# Patient Record
Sex: Male | Born: 1945
Health system: Southern US, Community
[De-identification: ages and names within clinical notes are randomized; demographics above are authoritative.]

## PROBLEM LIST (undated history)

## (undated) DIAGNOSIS — B1920 Unspecified viral hepatitis C without hepatic coma: Secondary | ICD-10-CM

## (undated) DIAGNOSIS — E785 Hyperlipidemia, unspecified: Secondary | ICD-10-CM

## (undated) DIAGNOSIS — I10 Essential (primary) hypertension: Secondary | ICD-10-CM

## (undated) HISTORY — PX: TRIGGER FINGER RELEASE: SHX641

## (undated) HISTORY — DX: Unspecified viral hepatitis C without hepatic coma: B19.20

## (undated) HISTORY — DX: Essential (primary) hypertension: I10

## (undated) HISTORY — PX: CARPAL TUNNEL RELEASE: SHX101

## (undated) HISTORY — DX: Hyperlipidemia, unspecified: E78.5

## (undated) HISTORY — PX: HEMORRHOID SURGERY: SHX153

---

## 2001-09-13 DIAGNOSIS — B1920 Unspecified viral hepatitis C without hepatic coma: Secondary | ICD-10-CM

## 2001-09-13 HISTORY — DX: Unspecified viral hepatitis C without hepatic coma: B19.20

## 2002-08-19 ENCOUNTER — Emergency Department (HOSPITAL_COMMUNITY): Admission: EM | Admit: 2002-08-19 | Discharge: 2002-08-19 | Payer: Self-pay | Admitting: Emergency Medicine

## 2002-08-19 ENCOUNTER — Encounter: Payer: Self-pay | Admitting: *Deleted

## 2003-10-28 ENCOUNTER — Ambulatory Visit (HOSPITAL_COMMUNITY): Admission: RE | Admit: 2003-10-28 | Discharge: 2003-10-28 | Payer: Self-pay

## 2003-10-28 ENCOUNTER — Encounter (INDEPENDENT_AMBULATORY_CARE_PROVIDER_SITE_OTHER): Payer: Self-pay | Admitting: Specialist

## 2006-06-17 ENCOUNTER — Emergency Department (HOSPITAL_COMMUNITY): Admission: EM | Admit: 2006-06-17 | Discharge: 2006-06-17 | Payer: Self-pay | Admitting: Emergency Medicine

## 2006-07-26 ENCOUNTER — Emergency Department (HOSPITAL_COMMUNITY): Admission: EM | Admit: 2006-07-26 | Discharge: 2006-07-26 | Payer: Self-pay | Admitting: Emergency Medicine

## 2006-12-25 ENCOUNTER — Emergency Department (HOSPITAL_COMMUNITY): Admission: EM | Admit: 2006-12-25 | Discharge: 2006-12-25 | Payer: Self-pay | Admitting: Emergency Medicine

## 2009-02-19 ENCOUNTER — Emergency Department (HOSPITAL_COMMUNITY): Admission: EM | Admit: 2009-02-19 | Discharge: 2009-02-19 | Payer: Self-pay | Admitting: Emergency Medicine

## 2009-07-06 ENCOUNTER — Emergency Department (HOSPITAL_COMMUNITY): Admission: EM | Admit: 2009-07-06 | Discharge: 2009-07-06 | Payer: Self-pay | Admitting: Emergency Medicine

## 2009-07-16 ENCOUNTER — Emergency Department (HOSPITAL_COMMUNITY): Admission: EM | Admit: 2009-07-16 | Discharge: 2009-07-16 | Payer: Self-pay | Admitting: Emergency Medicine

## 2009-08-15 ENCOUNTER — Ambulatory Visit: Payer: Self-pay | Admitting: Physician Assistant

## 2009-08-15 DIAGNOSIS — R030 Elevated blood-pressure reading, without diagnosis of hypertension: Secondary | ICD-10-CM

## 2009-08-15 DIAGNOSIS — Z860100 Personal history of colon polyps, unspecified: Secondary | ICD-10-CM | POA: Insufficient documentation

## 2009-08-15 DIAGNOSIS — M6530 Trigger finger, unspecified finger: Secondary | ICD-10-CM | POA: Insufficient documentation

## 2009-08-15 DIAGNOSIS — B182 Chronic viral hepatitis C: Secondary | ICD-10-CM | POA: Insufficient documentation

## 2009-08-15 DIAGNOSIS — R82998 Other abnormal findings in urine: Secondary | ICD-10-CM | POA: Insufficient documentation

## 2009-08-15 LAB — CONVERTED CEMR LAB
Bilirubin Urine: NEGATIVE
Glucose, Urine, Semiquant: NEGATIVE
Ketones, urine, test strip: NEGATIVE
Nitrite: NEGATIVE
Protein, U semiquant: NEGATIVE
Rapid HIV Screen: NEGATIVE
Specific Gravity, Urine: 1.02
Urobilinogen, UA: 0.2
WBC Urine, dipstick: NEGATIVE
pH: 5

## 2009-08-16 ENCOUNTER — Encounter: Payer: Self-pay | Admitting: Physician Assistant

## 2009-08-18 LAB — CONVERTED CEMR LAB
Bacteria, UA: NONE SEEN
RBC / HPF: NONE SEEN (ref <3)
WBC, UA: NONE SEEN cells/hpf (ref <3)

## 2009-08-25 ENCOUNTER — Encounter: Payer: Self-pay | Admitting: Physician Assistant

## 2009-08-26 ENCOUNTER — Encounter: Payer: Self-pay | Admitting: Physician Assistant

## 2009-08-28 ENCOUNTER — Encounter (INDEPENDENT_AMBULATORY_CARE_PROVIDER_SITE_OTHER): Payer: Self-pay | Admitting: *Deleted

## 2009-09-01 ENCOUNTER — Telehealth: Payer: Self-pay | Admitting: Physician Assistant

## 2009-09-01 DIAGNOSIS — F1410 Cocaine abuse, uncomplicated: Secondary | ICD-10-CM | POA: Insufficient documentation

## 2009-09-01 DIAGNOSIS — D709 Neutropenia, unspecified: Secondary | ICD-10-CM

## 2009-09-01 LAB — CONVERTED CEMR LAB
ALT: 46 units/L (ref 0–53)
AST: 40 units/L — ABNORMAL HIGH (ref 0–37)
Albumin: 4.6 g/dL (ref 3.5–5.2)
Alkaline Phosphatase: 55 units/L (ref 39–117)
Amphetamine Screen, Ur: NEGATIVE
BUN: 15 mg/dL (ref 6–23)
Barbiturate Quant, Ur: NEGATIVE
Basophils Absolute: 0 10*3/uL (ref 0.0–0.1)
Basophils Relative: 1 % (ref 0–1)
Benzodiazepines.: NEGATIVE
CO2: 21 meq/L (ref 19–32)
Calcium: 9.5 mg/dL (ref 8.4–10.5)
Chloride: 105 meq/L (ref 96–112)
Cocaine Metabolites: POSITIVE — AB
Creatinine, Ser: 1.23 mg/dL (ref 0.40–1.50)
Creatinine,U: 135.7 mg/dL
Eosinophils Absolute: 0.1 10*3/uL (ref 0.0–0.7)
Eosinophils Relative: 3 % (ref 0–5)
Glucose, Bld: 99 mg/dL (ref 70–99)
HCT: 44.8 % (ref 39.0–52.0)
Hemoglobin: 14.9 g/dL (ref 13.0–17.0)
Lymphocytes Relative: 57 % — ABNORMAL HIGH (ref 12–46)
Lymphs Abs: 2.1 10*3/uL (ref 0.7–4.0)
MCHC: 33.3 g/dL (ref 30.0–36.0)
MCV: 89.6 fL (ref 78.0–100.0)
Marijuana Metabolite: NEGATIVE
Methadone: NEGATIVE
Monocytes Absolute: 0.5 10*3/uL (ref 0.1–1.0)
Monocytes Relative: 13 % — ABNORMAL HIGH (ref 3–12)
Neutro Abs: 1 10*3/uL — ABNORMAL LOW (ref 1.7–7.7)
Neutrophils Relative %: 27 % — ABNORMAL LOW (ref 43–77)
Opiate Screen, Urine: NEGATIVE
PSA: 0.47 ng/mL (ref 0.10–4.00)
Phencyclidine (PCP): NEGATIVE
Platelets: 206 10*3/uL (ref 150–400)
Potassium: 4.7 meq/L (ref 3.5–5.3)
Propoxyphene: NEGATIVE
RBC: 5 M/uL (ref 4.22–5.81)
RDW: 13.5 % (ref 11.5–15.5)
Sodium: 139 meq/L (ref 135–145)
TSH: 2.198 microintl units/mL (ref 0.350–4.500)
Total Bilirubin: 0.5 mg/dL (ref 0.3–1.2)
Total Protein: 8.3 g/dL (ref 6.0–8.3)
WBC: 3.6 10*3/uL — ABNORMAL LOW (ref 4.0–10.5)

## 2009-09-03 ENCOUNTER — Ambulatory Visit: Payer: Self-pay | Admitting: Physician Assistant

## 2009-09-03 LAB — CONVERTED CEMR LAB
Basophils Absolute: 0 10*3/uL (ref 0.0–0.1)
Basophils Relative: 1 % (ref 0–1)
Eosinophils Relative: 1 % (ref 0–5)
HCT: 44.3 % (ref 39.0–52.0)
Hemoglobin: 14.5 g/dL (ref 13.0–17.0)
MCHC: 32.7 g/dL (ref 30.0–36.0)
Monocytes Absolute: 0.8 10*3/uL (ref 0.1–1.0)
Neutro Abs: 1.3 10*3/uL — ABNORMAL LOW (ref 1.7–7.7)
Platelets: 218 10*3/uL (ref 150–400)
RDW: 13.9 % (ref 11.5–15.5)

## 2009-09-04 ENCOUNTER — Encounter: Payer: Self-pay | Admitting: Physician Assistant

## 2009-09-27 ENCOUNTER — Encounter: Payer: Self-pay | Admitting: Physician Assistant

## 2009-10-09 ENCOUNTER — Encounter: Payer: Self-pay | Admitting: Physician Assistant

## 2009-10-21 ENCOUNTER — Ambulatory Visit: Payer: Self-pay | Admitting: Physician Assistant

## 2009-10-21 DIAGNOSIS — M25519 Pain in unspecified shoulder: Secondary | ICD-10-CM | POA: Insufficient documentation

## 2009-10-21 LAB — CONVERTED CEMR LAB
Protein, U semiquant: NEGATIVE
Specific Gravity, Urine: 1.025
Urobilinogen, UA: 0.2
WBC Urine, dipstick: NEGATIVE
pH: 5

## 2009-10-22 ENCOUNTER — Encounter: Payer: Self-pay | Admitting: Physician Assistant

## 2009-10-22 LAB — CONVERTED CEMR LAB
AST: 32 units/L (ref 0–37)
Alkaline Phosphatase: 54 units/L (ref 39–117)
BUN: 18 mg/dL (ref 6–23)
Calcium: 9.4 mg/dL (ref 8.4–10.5)
Cholesterol, target level: 200 mg/dL
Creatinine, Ser: 1.25 mg/dL (ref 0.40–1.50)
HDL: 54 mg/dL (ref >39)
LDL Cholesterol: 128 mg/dL — ABNORMAL HIGH (ref 0–99)
Total Bilirubin: 0.6 mg/dL (ref 0.3–1.2)
Total CHOL/HDL Ratio: 3.8
VLDL: 21 mg/dL (ref 0–40)

## 2009-10-26 ENCOUNTER — Ambulatory Visit (HOSPITAL_COMMUNITY): Admission: RE | Admit: 2009-10-26 | Discharge: 2009-10-26 | Payer: Self-pay | Admitting: Physician Assistant

## 2009-10-27 ENCOUNTER — Encounter: Payer: Self-pay | Admitting: Physician Assistant

## 2009-11-13 ENCOUNTER — Encounter: Payer: Self-pay | Admitting: Physician Assistant

## 2009-11-13 ENCOUNTER — Telehealth: Payer: Self-pay | Admitting: Physician Assistant

## 2009-11-17 ENCOUNTER — Encounter: Payer: Self-pay | Admitting: Physician Assistant

## 2009-11-19 ENCOUNTER — Telehealth: Payer: Self-pay | Admitting: Physician Assistant

## 2010-01-19 ENCOUNTER — Telehealth: Payer: Self-pay | Admitting: Physician Assistant

## 2010-01-19 ENCOUNTER — Ambulatory Visit: Payer: Self-pay | Admitting: Physician Assistant

## 2010-01-22 ENCOUNTER — Telehealth: Payer: Self-pay | Admitting: Physician Assistant

## 2010-01-22 ENCOUNTER — Ambulatory Visit: Payer: Self-pay | Admitting: Gastroenterology

## 2010-02-02 ENCOUNTER — Encounter: Payer: Self-pay | Admitting: Physician Assistant

## 2010-02-04 ENCOUNTER — Encounter: Payer: Self-pay | Admitting: Physician Assistant

## 2010-08-19 ENCOUNTER — Emergency Department (HOSPITAL_COMMUNITY)
Admission: EM | Admit: 2010-08-19 | Discharge: 2010-08-19 | Payer: Self-pay | Source: Home / Self Care | Admitting: Emergency Medicine

## 2010-10-13 NOTE — Letter (Signed)
Summary: RECEIVED RECORDS FROM DR.HAYES  RECEIVED RECORDS FROM DR.HAYES   Imported By: Arta Bruce 11/13/2009 14:38:09  _____________________________________________________________________  External Attachment:    Type:   Image     Comment:   External Document

## 2010-10-13 NOTE — Progress Notes (Signed)
Summary: Patient refused PT  Phone Note From Other Clinic   Caller: Nurse Summary of Call: [facility] have contacted Pt and Pt has declined services. Initial call taken by: Candi Leash,  November 13, 2009 9:54 AM

## 2010-10-13 NOTE — Letter (Signed)
Summary: REFERRAL/ORTHOPEDIC/[facility]  REFERRAL/ORTHOPEDIC/[facility]   Imported By: Arta Bruce 10/09/2009 14:18:04  _____________________________________________________________________  External Attachment:    Type:   Image     Comment:   External Document

## 2010-10-13 NOTE — Letter (Signed)
Summary: Lipid Letter  [facility]  [address] [place], [place] [address]   Phone: [phone]  Fax: [phone]    10/22/2009  Christopher Mcfarland [address] [place], [place]  [address]  Dear Channing Mutters:  We have carefully reviewed your last lipid profile from 10/21/2009 and the results are noted below with a summary of recommendations for lipid management.    Cholesterol:       203     Goal: <200   HDL "good" Cholesterol:   54     Goal: >40   LDL "bad" Cholesterol:   128     Goal: <160   Triglycerides:       104     Goal: <150     Your cholesterol numbers look ok except your total cholesterol should be below 200.  You are very close!  See the informatio below to help keep your cholesterol controlled and to get the total value down some.  All of your other labs were ok.    TLC Diet (Therapeutic Lifestyle Change): Saturated Fats & Transfatty acids should be kept < 7% of total calories ***Reduce Saturated Fats Polyunstaurated Fat can be up to 10% of total calories Monounsaturated Fat Fat can be up to 20% of total calories Total Fat should be no greater than 25-35% of total calories Carbohydrates should be 50-60% of total calories Protein should be approximately 15% of total calories Fiber should be at least 20-30 grams a day ***Increased fiber may help lower LDL Total Cholesterol should be < 200mg /day Consider adding plant stanol/sterols to diet (example: Benacol spread) ***A higher intake of unsaturated fat may reduce Triglycerides and Increase HDL    Adjunctive Measures (may lower LIPIDS and reduce risk of Heart Attack) include: Aerobic Exercise (20-30 minutes 3-4 times a week) Limit Alcohol Consumption Weight Reduction Dietary Fiber 20-30 grams a day by mouth    Current Medications: 1)    Centrum  Tabs (Multiple vitamins-minerals) .... Once daily 2)    Motrin Ib 200 Mg Tabs (Ibuprofen) .... 2 tabs q 6-8 hours as needed  If you have any questions, please  call. We appreciate being able to work with you.   Sincerely,    [facility] Tereso Newcomer PA-C

## 2010-10-13 NOTE — Letter (Signed)
Summary: MEDICAL SPECIALTY //INITIAL EVAL  MEDICAL SPECIALTY //INITIAL EVAL   Imported By: Arta Bruce 02/04/2010 15:27:51  _____________________________________________________________________  External Attachment:    Type:   Image     Comment:   External Document

## 2010-10-13 NOTE — Letter (Signed)
Summary: REQUESTING RECORDS FROM DR.HAYES  REQUESTING RECORDS FROM DR.HAYES   Imported By: Arta Bruce 10/22/2009 12:25:44  _____________________________________________________________________  External Attachment:    Type:   Image     Comment:   External Document

## 2010-10-13 NOTE — Letter (Signed)
Summary: MEDICAL SPECIALTY SERVICES  MEDICAL SPECIALTY SERVICES   Imported By: Arta Bruce 01/19/2010 15:31:14  _____________________________________________________________________  External Attachment:    Type:   Image     Comment:   External Document

## 2010-10-13 NOTE — Letter (Signed)
Summary: *HSN Results Follow up  [facility]  [address] [place], [place] [address]   Phone: [phone]  Fax: [phone]      11/13/2009   NAJIB COLMENARES [address] [place], [place]  [address]   Dear  Mr. ZAVEN KLEMENS,                            ____S.Drinkard,FNP   ____D. Gore,FNP       ____B. McPherson,MD   ____V. Rankins,MD    ____E. Mulberry,MD    ____N. Daphine Deutscher, FNP  ____D. Reche [place], MD    ____K. Philipp Deputy, MD    __x__S. Alben Spittle, PA-C     This letter is to inform you that your recent test(s):  _______Pap Smear    ___x____Lab Test     _______X-ray    ___x____ is within acceptable limits  _______ requires a medication change  _______ requires a follow-up lab visit  _______ requires a follow-up visit with your provider   Comments: Stool test was negative for blood.       _________________________________________________________ If you have any questions, please contact our office                     Sincerely,  Tereso Newcomer PA-C [facility]

## 2010-10-13 NOTE — Miscellaneous (Signed)
Summary: Patient seen at Hep C clinic 01/22/2010  Clinical Lists Changes  Problems: Assessed HEPATITIS C as comment only - seen at Hep C clinic by Dr. Jacqualine Mau 01/22/2010 patient told he could not enroll in tx until he is off cocaine Dr. Jacqualine Mau plans to see him in f/u in 6 mos  Observations: Added new observation of PAST MED HX: Hepatitis C   a.  followed with Dr. Madilyn Fireman (GI) in [place] (last seen in 2006)   b.  never had to start treatment   c. records rec'd from Dr. Madilyn Fireman:  patient was to be sent to medical specialty clinic in 2005 for tx. (viral load +; genotype 1a)   d.  seen by Dr. Jacqualine Mau at Hep C clinic 01/22/2010:  no consideration for tx. until is off cocaine Colonic polyps, hx of   a.  resected at [place]o. [place] in 1999    b.  going to some clinic at [facility] for yearly checks (describing free prostate screening)   c.  no colonoscopy since 1999  (02/04/2010 0:22)       Past History:  Past Medical History: Hepatitis C   a.  followed with Dr. Madilyn Fireman (GI) in [place] (last seen in 2006)   b.  never had to start treatment   c. records rec'd from Dr. Madilyn Fireman:  patient was to be sent to medical specialty clinic in 2005 for tx. (viral load +; genotype 1a)   d.  seen by Dr. Jacqualine Mau at Hep C clinic 01/22/2010:  no consideration for tx. until is off cocaine Colonic polyps, hx of   a.  resected at [place]o. [place] in 1999    b.  going to some clinic at [facility] for yearly checks (describing free prostate screening)   c.  no colonoscopy since 1999   Impression & Recommendations:  Problem # 1:  HEPATITIS C (ICD-070.51) Assessment Comment Only seen at Hep C clinic by Dr. Jacqualine Mau 01/22/2010 patient told he could not enroll in tx until he is off cocaine Dr. Jacqualine Mau plans to see him in f/u in 6 mos  Complete Medication List: 1)  Centrum Tabs (Multiple vitamins-minerals) .... Once daily 2)  Motrin Ib 200 Mg Tabs (Ibuprofen) .... 2 tabs q 6-8 hours as needed

## 2010-10-13 NOTE — Progress Notes (Signed)
Summary: Refer to hand surgeon.  Phone Note Outgoing Call   Summary of Call: Please check on referral to hand surgeon.  Referral put in system in 08/2009.  I just changed start date to today. Initial call taken by: Tereso Newcomer PA-C,  Jan 19, 2010 9:26 AM

## 2010-10-13 NOTE — Letter (Signed)
Summary: *HSN Results Follow up  [facility]  [address] [place], [place] [address]   Phone: [phone]  Fax: [phone]      10/27/2009   GANESH DEEG [address] [place], [place]  [address]   Dear  Mr. KOURTLAND COOPMAN,                            ____S.Drinkard,FNP   ____D. Gore,FNP       ____B. McPherson,MD   ____V. Rankins,MD    ____E. Mulberry,MD    ____N. Daphine Deutscher, FNP  ____D. Reche [place], MD    ____K. Philipp Deputy, MD    __x__S. Taliesin Hartlage     This letter is to inform you that your recent test(s):  _______Pap Smear    _______Lab Test     ____x___X-ray    ___x____ is within acceptable limits  _______ requires a medication change  _______ requires a follow-up lab visit  _______ requires a follow-up visit with your provider   Comments:       _________________________________________________________ If you have any questions, please contact our office                     Sincerely,  Tereso Newcomer PA-C [facility]

## 2010-10-13 NOTE — Assessment & Plan Note (Signed)
Summary: cpe///cns   Vital Signs:  Patient profile:   65 year old male Height:      65 inches Weight:      210 pounds BMI:     35.07 Temp:     97.8 degrees F oral Pulse rate:   82 / minute Pulse rhythm:   regular Resp:     18 per minute BP sitting:   136 / 87  (left arm) Cuff size:   large  Vitals Entered By: Armenia Shannon (October 21, 2009 8:31 AM) CC: CPE...  Does patient need assistance? Functional Status Self care Ambulation Normal   Primary Care Provider:  Tereso Newcomer PA-C  CC:  CPE....  History of Present Illness: Here for CPE.  Complaining of left shoulder pain x about one year.  No specific injury.  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  Hurts more at night.  Aches.  Located lateral deltoid area.  No significant stiffness or decreased ROM.  No radiating pain.  Laying on that side causes pain mainly at night when he lays down.  Some pain noted with movement during the day.  He has taken motrin with relief.    He has not heard about his referral to the hand surgeon yet for his trigger finger.  The referral was sent off in Jan.  He does admit to occ. cocaine use when asked.  He denies regular use.  He is not interested in going to counseling.  Habits & Providers  Alcohol-Tobacco-Diet     Alcohol drinks/day: 1     Tobacco Status: never  Problems Prior to Update: 1)  Shoulder Pain, Left  (ICD-719.41) 2)  Cocaine Abuse  (ICD-305.60) 3)  Neutropenia Unspecified  (ICD-288.00) 4)  Urinalysis, Abnormal  (ICD-791.9) 5)  Elevated Bp Reading Without Dx Hypertension  (ICD-796.2) 6)  Trigger Finger  (ICD-727.03) 7)  Preventive Health Care  (ICD-V70.0) 8)  Colonic Polyps, Hx of  (ICD-V12.72) 9)  Hepatitis C  (ICD-070.51)  Current Medications (verified): 1)  Centrum  Tabs (Multiple Vitamins-Minerals) .... Once Daily 2)  Motrin Ib 200 Mg Tabs (Ibuprofen) .... 2 Tabs Q 6-8 Hours As Needed  Allergies (verified): No Known Drug  Allergies  Past History:  Past Medical History: Reviewed history from 09/27/2009 and no changes required. Hepatitis C   a.  followed with Dr. Madilyn Fireman (GI) in [place] (last seen in 2006)   b.  never had to start treatment   c. records rec'd from Dr. Madilyn Fireman:  patient was to be sent to medical specialty clinic in 2005 for tx. (viral load +; genotype 1a) Colonic polyps, hx of   a.  resected at colo. [place] in 1999    b.  going to some clinic at [facility] for yearly checks (describing free prostate screening)   c.  no colonoscopy since 1999  Family History: Reviewed history and no changes required.  Social History: Reviewed history from 08/15/2009 and no changes required. 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 CV:  Denies chest pain or discomfort, difficulty breathing while lying down, shortness of breath with exertion, and swelling of feet. Resp:  Denies cough. GI:  Denies bloody stools, constipation, dark tarry stools, and diarrhea. GU:  Denies dysuria and hematuria. MS:  Complains of joint pain.  Endo:  Denies cold intolerance and heat intolerance.  Physical Exam  General:  alert, well-developed, and well-nourished.   Head:  normocephalic and atraumatic.   Eyes:  pupils equal, pupils round, pupils reactive to light, and no retinal abnormalitiies.   Ears:  R ear normal and L ear normal.   Nose:  no external deformity.   Mouth:  pharynx pink and moist, no erythema, and no exudates.   Neck:  supple, no thyromegaly, no carotid bruits, and no cervical lymphadenopathy.   Chest Wall:  no deformities.   Lungs:  normal breath sounds, no crackles, and no wheezes.   Heart:  normal rate, regular rhythm, and no murmur.   Abdomen:  soft, non-tender, normal bowel sounds, and no hepatomegaly.   Rectal:  external  hemorrhoid(s).   Genitalia:  circumcised, no hydrocele, no varicocele, no scrotal masses, no testicular masses or atrophy, no cutaneous lesions, and no urethral discharge.   Prostate:  no gland enlargement, no nodules, no asymmetry, and no induration.   Msk:  Left shoulder with full ROM no crepitus no pain over ACJ or coracoclavicular joint no bicipital groove tend  Extremities:  no edema Neurologic:  alert & oriented X3, cranial nerves II-XII intact, and strength normal in all extremities.   Skin:  turgor normal.   Psych:  normally interactive and good eye contact.     Impression & Recommendations:  Problem # 1:  SHOULDER PAIN, LEFT (ICD-719.41) suspect mild chronic RTC strain refer to PT check xray  His updated medication list for this problem includes:    Motrin Ib 200 Mg Tabs (Ibuprofen) .Marland Kitchen... 2 tabs q 6-8 hours as needed  Orders: Diagnostic X-Ray/Fluoroscopy (Diagnostic X-Ray/Flu) Physical Therapy Referral (PT)  Problem # 2:  PREVENTIVE HEALTH CARE (ICD-V70.0) update Td today flu shot given in Dec has colo referral in system PSA ok last time check lipids today  Orders: Hemoccult Cards -3 specimans (take home) (45409) T-Lipid Profile (81191-47829) T-Comprehensive Metabolic Panel (56213-08657)  Problem # 3:  HEPATITIS C (ICD-070.51) discussed with patient referral to specialty clinic and he is agreeable  Orders: T-Comprehensive Metabolic Panel (84696-29528) Misc. Referral (Misc. Ref)  Problem # 4:  ELEVATED BP READING WITHOUT DX HYPERTENSION (ICD-796.2) BP better but still borderline advised patient to watch salt intake; only take NSAIDs when he has to, etc.  Problem # 5:  TRIGGER FINGER (ICD-727.03) waiting on referral  Problem # 6:  COCAINE ABUSE (ICD-305.60) patient admits to using but does not report a problem admits he can stop advised of dangers  discussed referral to substance abuse counselor but he declines at this time  Complete Medication  List: 1)  Centrum Tabs (Multiple vitamins-minerals) .... Once daily 2)  Motrin Ib 200 Mg Tabs (Ibuprofen) .... 2 tabs q 6-8 hours as needed  Other Orders: TD Toxoids IM 7 YR + (41324) Admin 1st Vaccine (40102) Admin 1st Vaccine [facility]) [id number])  Patient Instructions: 1)  Tetanus shot today. 2)  Please schedule a follow-up appointment in 3 months with Scott for shoulder and blood pressure.   Laboratory Results   Urine Tests  Date/Time Received: October 21, 2009 8:39 AM   Routine Urinalysis   Glucose: negative   (Normal Range: Negative) Bilirubin: negative   (Normal Range: Negative) Ketone: negative   (Normal Range: Negative) Spec. Gravity: 1.025   (Normal Range: 1.003-1.035) Blood: negative   (Normal Range: Negative) pH: 5.0   (Normal Range: 5.0-8.0) Protein: negative   (Normal Range: Negative) Urobilinogen: 0.2   (Normal  Range: 0-1) Nitrite: negative   (Normal Range: Negative) Leukocyte Esterace: negative   (Normal Range: Negative)        Tetanus/Td Vaccine    Vaccine Type: Td    Site: right deltoid    Mfr: Sanofi Pasteur    Dose: 0.5 ml    Route: IM    Given by: Armenia Shannon    Exp. Date: 12/19/2011    Lot #: [id number]    VIS given: 08/01/07 version given October 21, 2009.   Appended Document: Hemoccult Results  Laboratory Results    Stool - Occult Blood Hemmoccult #1: negative Date: 11/07/2009 Hemoccult #2: negative Date: 11/07/2009 Hemoccult #3: negative Date: 11/07/2009

## 2010-10-13 NOTE — Miscellaneous (Signed)
Summary: records reviewed from GI   Clinical Lists Changes  Observations: Added new observation of PAST MED HX: Hepatitis C   a.  followed with Dr. Madilyn Fireman (GI) in [place] (last seen in 2006)   b.  never had to start treatment   c. records rec'd from Dr. Madilyn Fireman:  patient was to be sent to medical specialty clinic in 2005 for tx. (viral load +; genotype 1a) Colonic polyps, hx of   a.  resected at colo. [place] in 1999    b.  going to some clinic at [facility] for yearly checks (describing free prostate screening)   c.  no colonoscopy since 1999  (09/27/2009 21:50)       Past History:  Past Medical History: Hepatitis C   a.  followed with Dr. Madilyn Fireman (GI) in [place] (last seen in 2006)   b.  never had to start treatment   c. records rec'd from Dr. Madilyn Fireman:  patient was to be sent to medical specialty clinic in 2005 for tx. (viral load +; genotype 1a) Colonic polyps, hx of   a.  resected at colo. [place] in 1999    b.  going to some clinic at [facility] for yearly checks (describing free prostate screening)   c.  no colonoscopy since 1999

## 2010-10-13 NOTE — Progress Notes (Signed)
Summary: Office Visit//DEPRESION SCREENING  Office Visit//DEPRESION SCREENING   Imported By: Arta Bruce 12/18/2009 11:50:08  _____________________________________________________________________  External Attachment:    Type:   Image     Comment:   External Document

## 2010-10-13 NOTE — Letter (Signed)
Summary: PT INFORMATION SHEET  PT INFORMATION SHEET   Imported By: Arta Bruce 10/07/2009 15:58:29  _____________________________________________________________________  External Attachment:    Type:   Image     Comment:   External Document

## 2010-10-13 NOTE — Letter (Signed)
Summary: REFERRAL//[facility]  REFERRAL//[facility]   Imported By: Arta Bruce 10/09/2009 14:11:49  _____________________________________________________________________  External Attachment:    Type:   Image     Comment:   External Document

## 2010-10-13 NOTE — Assessment & Plan Note (Signed)
Summary: SCHED APP IN 3 MONTHS WITH Varnika Butz FOR SHOULDER AND BLOOD PRESS...   Vital Signs:  Patient profile:   65 year old male Height:      65 inches Weight:      208 pounds BMI:     34.74 Temp:     97.6 degrees F oral Pulse rate:   83 / minute Pulse rhythm:   regular Resp:     18 per minute BP sitting:   132 / 82  (left arm) Cuff size:   large  Vitals Entered By: Armenia Shannon (Jan 19, 2010 9:11 AM) CC: three month f/u.. pt says his shoulder is much better..., Hypertension Management Is Patient Diabetic? No Pain Assessment Patient in pain? no       Does patient need assistance? Functional Status Self care Ambulation Normal   Primary Care Provider:  Tereso Newcomer PA-C  CC:  three month f/u.. pt says his shoulder is much better... and Hypertension Management.  History of Present Illness: Here for f/u.  Elevated BP:  Doing well.  Numbers remain below goal without meds.  HEP C:  Has appt 01/22/2010.  Trigger finger:  Has not heard about surgeon referral.  Left shoulder pain:  Xray was ok.  Zenovia Jordan refused to go to PT.  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  Not taking any motrin.  Good ROM.    Hypertension History:      Positive major cardiovascular risk factors include male age 67 years old or older.  Negative major cardiovascular risk factors include no history of diabetes, no history of hypertension, and non-tobacco-user status.     Problems Prior to Update: 1)  Shoulder Pain, Left  (ICD-719.41) 2)  Cocaine Abuse  (ICD-305.60) 3)  Neutropenia Unspecified  (ICD-288.00) 4)  Urinalysis, Abnormal  (ICD-791.9) 5)  Elevated Bp Reading Without Dx Hypertension  (ICD-796.2) 6)  Trigger Finger  (ICD-727.03) 7)  Preventive Health Care  (ICD-V70.0) 8)  Colonic Polyps, Hx of  (ICD-V12.72) 9)  Hepatitis C  (ICD-070.51)  Current Medications (verified): 1)  Centrum  Tabs (Multiple Vitamins-Minerals) .... Once  Daily 2)  Motrin Ib 200 Mg Tabs (Ibuprofen) .... 2 Tabs Q 6-8 Hours As Needed  Allergies (verified): No Known Drug Allergies  Past History:  Past Medical History: Last updated: 09/27/2009 Hepatitis C   a.  followed with Dr. Madilyn Fireman (GI) in [place] (last seen in 2006)   b.  never had to start treatment   c. records rec'd from Dr. Madilyn Fireman:  patient was to be sent to medical specialty clinic in 2005 for tx. (viral load +; genotype 1a) Colonic polyps, hx of   a.  resected at colo. [place] in 1999    b.  going to some clinic at [facility] for yearly checks (describing free prostate screening)   c.  no colonoscopy since 1999  Past Surgical History: Last updated: 08/15/2009 Colon polypectomy (1999) Hemorrhoidectomy (1980) Carpal tunnel release (1999)   a. bilat hands Trigger Finger release right hand (1999)  Physical Exam  General:  alert, well-developed, and well-nourished.   Head:  normocephalic and atraumatic.   Neck:  supple.   Lungs:  normal breath sounds.   Heart:  normal rate and regular rhythm.   Msk:  left shoulder:  full ROM Neurologic:  alert & oriented X3 and cranial nerves II-XII intact.   Psych:  normally interactive.     Impression & Recommendations:  Problem #  1:  SHOULDER PAIN, LEFT (ICD-719.41) resolved  His updated medication list for this problem includes:    Motrin Ib 200 Mg Tabs (Ibuprofen) .Marland Kitchen... 2 tabs q 6-8 hours as needed  Problem # 2:  ELEVATED BP READING WITHOUT DX HYPERTENSION (ICD-796.2) stable  Problem # 3:  TRIGGER FINGER (ICD-727.03) still has not heard about referral  Problem # 4:  COLONIC POLYPS, HX OF (ICD-V12.72) needs colo send referral for GI  Problem # 5:  HEPATITIS C (ICD-070.51) has appt with Hep C clinic this week  Complete Medication List: 1)  Centrum Tabs (Multiple vitamins-minerals) .... Once daily 2)  Motrin Ib 200 Mg Tabs (Ibuprofen) .... 2 tabs q 6-8 hours as needed  Hypertension Assessment/Plan:      The patient's hypertensive  risk group is category B: At least one risk factor (excluding diabetes) with no target organ damage.  His calculated 10 year risk of coronary heart disease is 11 %.  Today's blood pressure is 132/82.    Patient Instructions: 1)  Schedule CPE with Bettymae Yott in February 2012.  Come fasting for lab work.

## 2010-10-13 NOTE — Progress Notes (Signed)
Summary: Appt with [facility] 01/22/2010 at 1:00  Phone Note Outgoing Call   Summary of Call: Please make sure patient is aware of his appt on 01/22/2010 at 1:00 PM at [facility] on [address] for Hep C. Initial call taken by: Tereso Newcomer PA-C,  November 19, 2009 8:11 AM  Follow-up for Phone Call        pt aware of appt time and date Follow-up by: Armenia Shannon,  November 19, 2009 11:37 AM

## 2010-10-13 NOTE — Progress Notes (Signed)
Summary: GI referral  Phone Note Outgoing Call   Summary of Call: Refer to GI for screening colo. Initial call taken by: Tereso Newcomer PA-C,  Jan 19, 2010 9:29 AM

## 2010-10-13 NOTE — Progress Notes (Signed)
Summary: Ortho referral  Phone Note From Other Clinic   Caller: Referral Coordinator Summary of Call: Pt wants to know if the ortho referral can wait until July or August? Initial call taken by: Candi Leash,  Jan 22, 2010 10:10 AM  Follow-up for Phone Call        That's fine. Follow-up by: Tereso Newcomer PA-C,  Jan 22, 2010 10:14 PM

## 2010-10-13 NOTE — Progress Notes (Signed)
Summary: Office Visit/DEPRESSION SCREENING  Office Visit/DEPRESSION SCREENING   Imported By: Arta Bruce 10/07/2009 12:12:37  _____________________________________________________________________  External Attachment:    Type:   Image     Comment:   External Document

## 2011-08-22 ENCOUNTER — Encounter: Payer: Self-pay | Admitting: *Deleted

## 2011-08-22 ENCOUNTER — Emergency Department (HOSPITAL_COMMUNITY)
Admission: EM | Admit: 2011-08-22 | Discharge: 2011-08-22 | Disposition: A | Discharge status: Home / Self Care | Payer: Self-pay | Attending: Emergency Medicine | Admitting: Emergency Medicine

## 2011-08-22 DIAGNOSIS — R059 Cough, unspecified: Secondary | ICD-10-CM | POA: Insufficient documentation

## 2011-08-22 DIAGNOSIS — J40 Bronchitis, not specified as acute or chronic: Secondary | ICD-10-CM | POA: Insufficient documentation

## 2011-08-22 DIAGNOSIS — R062 Wheezing: Secondary | ICD-10-CM | POA: Insufficient documentation

## 2011-08-22 DIAGNOSIS — R05 Cough: Secondary | ICD-10-CM | POA: Insufficient documentation

## 2011-08-22 MED ORDER — HYDROCOD POLST-CHLORPHEN POLST 10-8 MG/5ML PO LQCR: 5.0000 mL | Freq: Two times a day (BID) | ORAL | Status: DC | PRN

## 2011-08-22 MED ORDER — ALBUTEROL SULFATE HFA 108 (90 BASE) MCG/ACT IN AERS
2.0000 | INHALATION_SPRAY | RESPIRATORY_TRACT | Status: DC | PRN
  Administered 2011-08-22: 2 via RESPIRATORY_TRACT
  Filled 2011-08-22: qty 6.7

## 2011-08-22 MED ORDER — AEROCHAMBER MAX W/MASK SMALL MISC
1.0000 | Freq: Once | Status: AC
  Administered 2011-08-22: 1
  Filled 2011-08-22 (×2): qty 1

## 2011-08-22 NOTE — ED Provider Notes (Signed)
History     CSN: [id number] Arrival date & time: 08/22/2011  9:03 AM   First MD Initiated Contact with Patient 08/22/11 [phone]      Chief Complaint  Patient presents with  . Cough    cough, sorethroat x3wks.    (Consider location/radiation/quality/duration/timing/severity/associated sxs/prior treatment) HPI Comments: Patient with onset of upper respiratory tract infection symptoms including sore throat, nasal congestion, cough 3 weeks ago. Patient denies having a fever. Symptoms have improved except for his cough which has been persistent. Patient denies wheezing. He states he has had trouble with bronchitis in the past. Patient is not a smoker. He is tried several over-the-counter medicines with mild relief.  Patient is a 65 y.o. male presenting with cough. The history is provided by the patient.  Cough This is a chronic problem. The current episode started more than 1 week ago. The problem has not changed since onset.The cough is non-productive. There has been no fever. Associated symptoms include rhinorrhea. Pertinent negatives include no chest pain, no chills, no weight loss, no ear congestion, no ear pain, no headaches, no sore throat, no myalgias, no shortness of breath, no wheezing and no eye redness. He has tried decongestants for the symptoms. The treatment provided mild relief. His past medical history is significant for bronchitis. His past medical history does not include COPD or asthma.    History reviewed. No pertinent past medical history.  Past Surgical History  Procedure Date  . Carpal tunnel release   . Trigger finger release   . Hemorrhoid surgery     No family history on file.  History  Substance Use Topics  . Smoking status: Never Smoker   . Smokeless tobacco: Not on file  . Alcohol Use: Yes      Review of Systems  Constitutional: Negative for chills, weight loss and fatigue.  HENT: Positive for congestion and rhinorrhea. Negative for ear pain and sore  throat.   Eyes: Negative for redness.  Respiratory: Positive for cough. Negative for shortness of breath and wheezing.   Cardiovascular: Negative for chest pain.  Gastrointestinal: Negative for nausea, vomiting, abdominal pain, diarrhea and constipation.  Genitourinary: Negative for dysuria.  Musculoskeletal: Negative for myalgias.  Skin: Negative for rash.  Neurological: Negative for dizziness and headaches.    Allergies  Review of patient's allergies indicates no known allergies.  Home Medications   Current Outpatient Rx  Name Route Sig Dispense Refill  . ALKA-SELTZER PLUS COLD PO Oral Take 2 tablets by mouth as needed. For cold symptoms     . GUAIFENESIN 400 MG PO TABS Oral Take 400 mg by mouth every 4 (four) hours. For cold symptoms      . ALKA-SELTZER PLUS NIGHT COLD PO Oral Take 2 tablets by mouth as needed. For cold symptoms     . PHENYLEPHRINE-DM-GG-APAP 5-10-200-325 MG PO TABS Oral Take 2 tablets by mouth every 4 (four) hours. For cold symptoms     . HYDROCOD POLST-CHLORPHEN POLST 10-8 MG/5ML PO LQCR Oral Take 5 mLs by mouth every 12 (twelve) hours as needed. 115 mL 0    BP 137/88  Pulse 106  Temp(Src) 98.6 F (37 C) (Oral)  Resp 20  SpO2 97%  Physical Exam  Nursing note and vitals reviewed. Constitutional: He is oriented to person, place, and time. He appears well-developed and well-nourished.  HENT:  Head: Normocephalic and atraumatic.  Eyes: Conjunctivae are normal. Right eye exhibits no discharge. Left eye exhibits no discharge.  Neck: Normal range of motion.  Neck supple.  Cardiovascular: Normal rate, regular rhythm and normal heart sounds.   Pulmonary/Chest: Effort normal. He has wheezes.       Mild wheezing diffusely bilaterally.  Abdominal: Soft. Bowel sounds are normal. There is no tenderness. There is no rebound and no guarding.  Musculoskeletal: He exhibits no edema.  Neurological: He is alert and oriented to person, place, and time.  Skin: Skin is  warm and dry.  Psychiatric: He has a normal mood and affect.    ED Course  Procedures (including critical care time)  Labs Reviewed - No data to display No results found.   1. Bronchitis    10:23 AM Patient counseled on supportive care for bronchitis and s/s to return including worsening symptoms, persistent fever, persistent vomiting, or if they have any other concerns.  Urged to see PCP if symptoms persist for more than 3 days. Patient verbalizes understanding and agrees with plan.   10:23 AM Patient counseled on use of albuterol HFA.  Told to use 1-2 puffs q 4 hours as needed for SOB.    MDM  Patient with likely bronchitis/chronic cough after upper respiratory tract infection. No concern for PNA given lung exam. Will treat with inhaler given mild wheezing. Antibiotics not indicated. Conservative therapy indicated.  Patient appears well and is stable for discharge home.         Carolee Rota, [place] 08/22/11 [phone]

## 2011-08-23 NOTE — ED Provider Notes (Signed)
Evaluation and management procedures were performed by the mid-level provider (PA/NP/CNM) under my supervision/collaboration. I was present and available during the ED course. Lisvet Rasheed Y.   Darlean Warmoth Y. Biddie Sebek, MD 08/23/11 1151 

## 2011-09-18 ENCOUNTER — Emergency Department (HOSPITAL_COMMUNITY)
Admission: EM | Admit: 2011-09-18 | Discharge: 2011-09-18 | Disposition: A | Discharge status: Home / Self Care | Payer: Self-pay | Attending: Emergency Medicine | Admitting: Emergency Medicine

## 2011-09-18 ENCOUNTER — Encounter (HOSPITAL_COMMUNITY): Payer: Self-pay

## 2011-09-18 DIAGNOSIS — M62838 Other muscle spasm: Secondary | ICD-10-CM | POA: Insufficient documentation

## 2011-09-18 DIAGNOSIS — M542 Cervicalgia: Secondary | ICD-10-CM | POA: Insufficient documentation

## 2011-09-18 DIAGNOSIS — Z79899 Other long term (current) drug therapy: Secondary | ICD-10-CM | POA: Insufficient documentation

## 2011-09-18 MED ORDER — OXYCODONE-ACETAMINOPHEN 5-325 MG PO TABS
1.0000 | ORAL_TABLET | Freq: Once | ORAL | Status: AC
  Administered 2011-09-18: 1 via ORAL
  Filled 2011-09-18: qty 1

## 2011-09-18 MED ORDER — OXYCODONE-ACETAMINOPHEN 5-325 MG PO TABS: 2.0000 | ORAL_TABLET | ORAL | Status: AC | PRN

## 2011-09-18 MED ORDER — IBUPROFEN 800 MG PO TABS
800.0000 mg | ORAL_TABLET | Freq: Once | ORAL | Status: AC
  Administered 2011-09-18: 800 mg via ORAL
  Filled 2011-09-18: qty 1

## 2011-09-18 MED ORDER — CYCLOBENZAPRINE HCL 10 MG PO TABS
10.0000 mg | ORAL_TABLET | Freq: Once | ORAL | Status: AC
  Administered 2011-09-18: 10 mg via ORAL
  Filled 2011-09-18: qty 1

## 2011-09-18 MED ORDER — CYCLOBENZAPRINE HCL 10 MG PO TABS: 10.0000 mg | ORAL_TABLET | Freq: Three times a day (TID) | ORAL | Status: AC | PRN

## 2011-09-18 NOTE — ED Provider Notes (Addendum)
History     CSN: [id number]  Arrival date & time 09/18/11  0814   First MD Initiated Contact with Patient 09/18/11 [phone]      Chief Complaint  Patient presents with  . Neck Injury    (Consider location/radiation/quality/duration/timing/severity/associated sxs/prior treatment) Patient is a 66 y.o. male presenting with neck injury. The history is provided by the patient. No language interpreter was used.  Neck Injury This is a new problem. The current episode started in the past 7 days. The problem occurs constantly. The problem has been unchanged. Associated symptoms include neck pain. Pertinent negatives include no abdominal pain, chest pain, fever, headaches, nausea, sore throat, swollen glands, vertigo, vomiting or weakness. He has tried relaxation and NSAIDs (flexeril) for the symptoms. The treatment provided mild relief.  c/o trapezius tenderness x 4 days.  Took ibuprofen with no relief.  Took flexeril with some relief.  Was here for the same 1 month ago.  No ue weakness or numbness.  Hypertensive today.   History reviewed. No pertinent past medical history.  Past Surgical History  Procedure Date  . Carpal tunnel release   . Trigger finger release   . Hemorrhoid surgery     History reviewed. No pertinent family history.  History  Substance Use Topics  . Smoking status: Never Smoker   . Smokeless tobacco: Not on file  . Alcohol Use: Yes      Review of Systems  Constitutional: Negative for fever.  HENT: Positive for neck pain. Negative for sore throat.   Cardiovascular: Negative for chest pain.  Gastrointestinal: Negative for nausea, vomiting and abdominal pain.  Neurological: Negative for vertigo, weakness and headaches.  All other systems reviewed and are negative.    Allergies  Review of patient's allergies indicates no known allergies.  Home Medications   Current Outpatient Rx  Name Route Sig Dispense Refill  . CYCLOBENZAPRINE HCL 10 MG PO TABS Oral Take 1  tablet (10 mg total) by mouth 3 (three) times daily as needed for muscle spasms. 30 tablet 0  . OXYCODONE-ACETAMINOPHEN 5-325 MG PO TABS Oral Take 2 tablets by mouth every 4 (four) hours as needed for pain. 15 tablet 0    BP 163/115  Pulse 99  Temp(Src) 99 F (37.2 C) (Oral)  Resp 16  SpO2 98%  Physical Exam  Nursing note and vitals reviewed. Constitutional: He is oriented to person, place, and time. He appears well-developed and well-nourished.  HENT:  Head: Normocephalic and atraumatic.  Eyes: Pupils are equal, round, and reactive to light.  Neck: Neck supple.  Cardiovascular: Normal rate and regular rhythm.  Exam reveals no gallop and no friction rub.   No murmur heard. Pulmonary/Chest: Breath sounds normal. No respiratory distress.  Abdominal: Soft. He exhibits no distension.  Musculoskeletal: Normal range of motion. He exhibits tenderness.       Muscle spasm to R neck  Neurological: He is alert and oriented to person, place, and time. No cranial nerve deficit.  Skin: Skin is warm and dry.  Psychiatric: He has a normal mood and affect.    ED Course  Procedures (including critical care time)  Labs Reviewed - No data to display No results found.   1. Trapezius muscle spasm       MDM  Presents with neck pain x 3-4 days. Some relief with ibuprofen and percocet in the ER today.  No fever, visible muscle spasm.  Will treat with ibuprofen. Ice, percocet and flexeril.  Advised not to drive with  some of these meds.          Jethro Bastos, NP 09/18/11 1646  Jethro Bastos, NP 10/15/11 [phone]

## 2011-09-18 NOTE — ED Notes (Signed)
Pt in from home with neck pain x3-4 days denies recent injury states difficulty turning head states occurred in the past relieved with Ibuprofen and muscle relaxer

## 2011-09-19 NOTE — ED Provider Notes (Signed)
Medical screening examination/treatment/procedure(s) were performed by non-physician practitioner and as supervising physician I was immediately available for consultation/collaboration. Aurelia Gras Y.   Kregg Cihlar Y. Nabria Nevin, MD 09/19/11 1552 

## 2011-10-15 NOTE — ED Provider Notes (Signed)
Medical screening examination/treatment/procedure(s) were performed by non-physician practitioner and as supervising physician I was immediately available for consultation/collaboration. Kaity Pitstick Y.   Issak Goley Y. Jefferey Lippmann, MD 10/15/11 2051 

## 2013-08-11 ENCOUNTER — Emergency Department (HOSPITAL_COMMUNITY)
Admission: EM | Admit: 2013-08-11 | Discharge: 2013-08-11 | Disposition: A | Discharge status: Home / Self Care | Payer: Medicare HMO | Attending: Emergency Medicine | Admitting: Emergency Medicine

## 2013-08-11 ENCOUNTER — Encounter (HOSPITAL_COMMUNITY): Payer: Self-pay | Admitting: Emergency Medicine

## 2013-08-11 DIAGNOSIS — R059 Cough, unspecified: Secondary | ICD-10-CM | POA: Insufficient documentation

## 2013-08-11 DIAGNOSIS — R05 Cough: Secondary | ICD-10-CM

## 2013-08-11 DIAGNOSIS — J3489 Other specified disorders of nose and nasal sinuses: Secondary | ICD-10-CM | POA: Insufficient documentation

## 2013-08-11 MED ORDER — HYDROCODONE-HOMATROPINE 5-1.5 MG/5ML PO SYRP: 5.0000 mL | ORAL_SOLUTION | Freq: Four times a day (QID) | ORAL | Status: DC | PRN

## 2013-08-11 NOTE — ED Notes (Signed)
Pt c/o dry cough x 1 wk.  Denies NVD.

## 2013-08-11 NOTE — ED Provider Notes (Signed)
Medical screening examination/treatment/procedure(s) were performed by non-physician practitioner and as supervising physician I was immediately available for consultation/collaboration.  EKG Interpretation   None         Gwyneth Sprout, MD 08/11/13 1510

## 2013-08-11 NOTE — ED Notes (Addendum)
Pt c/o non-productive cough x1 week.  Pt denies N/V/D, fever and sore throat.  Pt's lung sounds are clear bilat. Pt denies pain anywhere.  Pt states he has had some nasal drainage.

## 2013-08-11 NOTE — ED Provider Notes (Signed)
CSN: [id number]     Arrival date & time 08/11/13  1232 History   This chart was scribed for non-physician practitioner Arthor Captain, PA-C, working with Gwyneth Sprout, MD, by Yevette Edwards, ED Scribe. This patient was seen in room WTR9/WTR9 and the patient's care was started at 12:48 PM.  First MD Initiated Contact with Patient 08/11/13 1235     Chief Complaint  Patient presents with  . Cough    The history is provided by the patient. No language interpreter was used.   HPI Comments: Christopher Mcfarland is a 67 y.o. male, with a h/o bronchitis, who presents to the Emergency Department complaining of a nonproductive, dry cough which has persisted for approximately a week and which he describes as "nagging." The pt has experienced rhinorrhea. He has not experienced a fever, nausea, emesis, sore throat, chest pain, or SOB. The pt denies taking any ACE inhibitors. He denies any cardiac issues. The pt is a non-smoker.   History reviewed. No pertinent past medical history. Past Surgical History  Procedure Laterality Date  . Carpal tunnel release    . Trigger finger release    . Hemorrhoid surgery     History reviewed. No pertinent family history. History  Substance Use Topics  . Smoking status: Never Smoker   . Smokeless tobacco: Not on file  . Alcohol Use: Yes    Review of Systems  Constitutional: Negative for fever.  HENT: Positive for rhinorrhea. Negative for sore throat.   Respiratory: Positive for cough. Negative for shortness of breath.   Cardiovascular: Negative for chest pain.  Gastrointestinal: Negative for nausea and vomiting.  All other systems reviewed and are negative.    Allergies  Review of patient's allergies indicates no known allergies.  Home Medications   Current Outpatient Rx  Name  Route  Sig  Dispense  Refill  . guaiFENesin-dextromethorphan (ROBITUSSIN DM) 100-10 MG/5ML syrup   Oral   Take 5 mLs by mouth every 4 (four) hours as needed for cough.           Triage Vitals: BP 144/90  Pulse 99  Temp(Src) 98.5 F (36.9 C) (Oral)  Resp 17  SpO2 98%  Physical Exam  Nursing note and vitals reviewed. Constitutional: He is oriented to person, place, and time. He appears well-developed and well-nourished. No distress.  HENT:  Head: Normocephalic and atraumatic.  Eyes: EOM are normal.  Neck: Neck supple. No tracheal deviation present.  Cardiovascular: Normal rate.   Pulmonary/Chest: Effort normal. No respiratory distress.  Bronchitic cough, but lung sounds are clear.   Musculoskeletal: Normal range of motion.  Neurological: He is alert and oriented to person, place, and time.  Skin: Skin is warm and dry.  Psychiatric: He has a normal mood and affect. His behavior is normal.    ED Course  Procedures (including critical care time)  DIAGNOSTIC STUDIES: Oxygen Saturation is 98% on room air, normal by my interpretation.    COORDINATION OF CARE:  12:53 PM- Discussed treatment plan with patient, and the patient agreed to the plan.   Labs Review Labs Reviewed - No data to display Imaging Review No results found.  EKG Interpretation   None       MDM   1. Cough    No use of ace inhibitors. Lungs are clear. Pt CXR negative for acute infiltrate. Patients symptoms are consistent with URI, likely viral etiology. Discussed that antibiotics are not indicated for viral infections. Pt will be discharged with symptomatic treatment.  Verbalizes understanding and is agreeable with plan. Pt is hemodynamically stable & in NAD prior to dc.   I personally performed the services described in this documentation, which was scribed in my presence. The recorded information has been reviewed and is accurate.     Arthor Captain, PA-C 08/11/13 1331

## 2014-04-08 ENCOUNTER — Encounter: Payer: Self-pay | Admitting: Gastroenterology

## 2014-05-29 ENCOUNTER — Ambulatory Visit (AMBULATORY_SURGERY_CENTER): Payer: Commercial Managed Care - HMO

## 2014-05-29 VITALS — Ht 69.0 in | Wt 202.0 lb

## 2014-05-29 DIAGNOSIS — Z860100 Personal history of colon polyps, unspecified: Secondary | ICD-10-CM

## 2014-05-29 MED ORDER — SUPREP BOWEL PREP KIT 17.5-3.13-1.6 GM/177ML PO SOLN: 1.0000 | Freq: Once | ORAL | Status: DC

## 2014-05-29 NOTE — Progress Notes (Signed)
No allergies to eggs or soy No past problems with anesthesia No diet/weight loss meds No home oxygen  No email.  Given Emmi instruction sheet to go to website and enter access code for colonoscopy information.

## 2014-06-12 ENCOUNTER — Encounter: Payer: Self-pay | Admitting: Gastroenterology

## 2014-06-12 ENCOUNTER — Ambulatory Visit (AMBULATORY_SURGERY_CENTER): Payer: Medicare HMO | Admitting: Gastroenterology

## 2014-06-12 VITALS — BP 131/83 | HR 78 | Temp 97.4°F | Resp 20 | Ht 69.0 in | Wt 202.0 lb

## 2014-06-12 DIAGNOSIS — K5730 Diverticulosis of large intestine without perforation or abscess without bleeding: Secondary | ICD-10-CM

## 2014-06-12 DIAGNOSIS — Z86010 Personal history of colonic polyps: Secondary | ICD-10-CM

## 2014-06-12 MED ORDER — SODIUM CHLORIDE 0.9 % IV SOLN: 500.0000 mL | INTRAVENOUS | Status: DC

## 2014-06-12 NOTE — Progress Notes (Signed)
Patient awakening,vss,report to rn 

## 2014-06-12 NOTE — Patient Instructions (Signed)
Impressions/recommendations:  Diverticulosis (handout given) High fiber diet (handout given)  Repeat colonoscopy in 10 years.  YOU HAD AN ENDOSCOPIC PROCEDURE TODAY AT THE [facility]: Refer to the procedure report that was given to you for any specific questions about what was found during the examination.  If the procedure report does not answer your questions, please call your gastroenterologist to clarify.  If you requested that your care partner not be given the details of your procedure findings, then the procedure report has been included in a sealed envelope for you to review at your convenience later.  YOU SHOULD EXPECT: Some feelings of bloating in the abdomen. Passage of more gas than usual.  Walking can help get rid of the air that was put into your GI tract during the procedure and reduce the bloating. If you had a lower endoscopy (such as a colonoscopy or flexible sigmoidoscopy) you may notice spotting of blood in your stool or on the toilet paper. If you underwent a bowel prep for your procedure, then you may not have a normal bowel movement for a few days.  DIET: Your first meal following the procedure should be a light meal and then it is ok to progress to your normal diet.  A half-sandwich or bowl of soup is an example of a good first meal.  Heavy or fried foods are harder to digest and may make you feel nauseous or bloated.  Likewise meals heavy in dairy and vegetables can cause extra gas to form and this can also increase the bloating.  Drink plenty of fluids but you should avoid alcoholic beverages for 24 hours.  ACTIVITY: Your care partner should take you home directly after the procedure.  You should plan to take it easy, moving slowly for the rest of the day.  You can resume normal activity the day after the procedure however you should NOT DRIVE or use heavy machinery for 24 hours (because of the sedation medicines used during the test).    SYMPTOMS TO REPORT  IMMEDIATELY: A gastroenterologist can be reached at any hour.  During normal business hours, 8:30 AM to 5:00 PM Monday through Friday, call [phone].  After hours and on weekends, please call the GI answering service at [phone] who will take a message and have the physician on call contact you.   Following lower endoscopy (colonoscopy or flexible sigmoidoscopy):  Excessive amounts of blood in the stool  Significant tenderness or worsening of abdominal pains  Swelling of the abdomen that is new, acute  Fever of 100F or higher  FOLLOW UP: If any biopsies were taken you will be contacted by phone or by letter within the next 1-3 weeks.  Call your gastroenterologist if you have not heard about the biopsies in 3 weeks.  Our staff will call the home number listed on your records the next business day following your procedure to check on you and address any questions or concerns that you may have at that time regarding the information given to you following your procedure. This is a courtesy call and so if there is no answer at the home number and we have not heard from you through the emergency physician on call, we will assume that you have returned to your regular daily activities without incident.  SIGNATURES/CONFIDENTIALITY: You and/or your care partner have signed paperwork which will be entered into your electronic medical record.  These signatures attest to the fact that that the information above on your   After Visit Summary has been reviewed and is understood.  Full responsibility of the confidentiality of this discharge information lies with you and/or your care-partner. 

## 2014-06-12 NOTE — Op Note (Signed)
[facility] [address] [place], [id number]   COLONOSCOPY PROCEDURE REPORT  PATIENT: Christopher Mcfarland, Christopher Mcfarland  MR#: [id number] BIRTHDATE: 1946/02/06 , 68  yrs. old GENDER: male ENDOSCOPIST: Louis Meckelobert D Jozette Castrellon, MD REFERRED BY: PROCEDURE DATE:  06/12/2014 PROCEDURE:   Colonoscopy, diagnostic First Screening Colonoscopy - Avg.  risk and is 50 yrs.  old or older - No.  Prior Negative Screening - Now for repeat screening. N/A  History of Adenoma - Now for follow-up colonoscopy & has been > or = to 3 yrs.  Yes hx of adenoma.  Has been 3 or more years since last colonoscopy.  Polyps Removed Today? No.  Recommend repeat exam, <10 yrs? No. ASA CLASS:   Class II INDICATIONS:high risk personal history of colonic polyps .1999 MEDICATIONS: Monitored anesthesia care and Propofol 250 mg IV  DESCRIPTION OF PROCEDURE:   After the risks benefits and alternatives of the procedure were thoroughly explained, informed consent was obtained.  The digital rectal exam revealed no abnormalities of the rectum.   The LB [id number]  endoscope was introduced through the anus and advanced to the cecum, which was identified by both the appendix and ileocecal valve. No adverse events experienced.   The quality of the prep was     The instrument was then slowly withdrawn as the colon was fully examined.      COLON FINDINGS: There was mild diverticulosis noted in the descending colon and sigmoid colon.   The examination was otherwise normal.  Retroflexed views revealed no abnormalities (photo  not available). The time to cecum=3 minutes 58 seconds.  Withdrawal time=6 minutes 08 seconds.  The scope was withdrawn and the procedure completed. COMPLICATIONS: There were no immediate complications.  ENDOSCOPIC IMPRESSION: 1.   Mild diverticulosis was noted in the descending colon and sigmoid colon 2.   The examination was otherwise normal  RECOMMENDATIONS: Continue current colorectal  screening recommendations for "routine risk" patients with a repeat colonoscopy in 10 years.  eSigned:  Louis Meckelobert D Shikara Mcauliffe, MD 06/12/2014 2:02 PM   cc:

## 2014-06-13 ENCOUNTER — Telehealth: Payer: Self-pay

## 2014-06-13 NOTE — Telephone Encounter (Signed)
  Follow up Call-  Call back number 06/12/2014  Post procedure Call Back phone  # [phone] home  Permission to leave phone message Yes     Patient questions:  Do you have a fever, pain , or abdominal swelling? No. Pain Score  0 *  Have you tolerated food without any problems? Yes.    Have you been able to return to your normal activities? Yes.    Do you have any questions about your discharge instructions: Diet   No. Medications  No. Follow up visit  No.  Do you have questions or concerns about your Care? No.  Actions: * If pain score is 4 or above: No action needed, pain <4.

## 2015-08-13 ENCOUNTER — Telehealth: Payer: Self-pay | Admitting: Lab

## 2015-08-13 NOTE — Telephone Encounter (Signed)
LM for pt to call office-He needs required labs done before receiving an appmt with Dr. Luciana Axeomer.

## 2015-08-20 ENCOUNTER — Other Ambulatory Visit: Payer: Medicare HMO

## 2015-08-20 DIAGNOSIS — B182 Chronic viral hepatitis C: Secondary | ICD-10-CM

## 2015-08-20 LAB — CBC WITH DIFFERENTIAL/PLATELET
BASOS PCT: 1 % (ref 0–1)
Basophils Absolute: 0 10*3/uL (ref 0.0–0.1)
EOS PCT: 2 % (ref 0–5)
Eosinophils Absolute: 0.1 10*3/uL (ref 0.0–0.7)
HCT: 42.1 % (ref 39.0–52.0)
HEMOGLOBIN: 14.1 g/dL (ref 13.0–17.0)
Lymphocytes Relative: 56 % — ABNORMAL HIGH (ref 12–46)
Lymphs Abs: 2.1 10*3/uL (ref 0.7–4.0)
MCH: 29 pg (ref 26.0–34.0)
MCHC: 33.5 g/dL (ref 30.0–36.0)
MCV: 86.4 fL (ref 78.0–100.0)
MONO ABS: 0.5 10*3/uL (ref 0.1–1.0)
MPV: 11.5 fL (ref 8.6–12.4)
Monocytes Relative: 12 % (ref 3–12)
NEUTROS ABS: 1.1 10*3/uL — AB (ref 1.7–7.7)
Neutrophils Relative %: 29 % — ABNORMAL LOW (ref 43–77)
Platelets: 227 10*3/uL (ref 150–400)
RBC: 4.87 MIL/uL (ref 4.22–5.81)
RDW: 13.9 % (ref 11.5–15.5)
WBC: 3.8 10*3/uL — AB (ref 4.0–10.5)

## 2015-08-20 LAB — COMPREHENSIVE METABOLIC PANEL
ALK PHOS: 52 U/L (ref 40–115)
ALT: 45 U/L (ref 9–46)
AST: 41 U/L — AB (ref 10–35)
Albumin: 3.9 g/dL (ref 3.6–5.1)
BILIRUBIN TOTAL: 0.7 mg/dL (ref 0.2–1.2)
BUN: 14 mg/dL (ref 7–25)
CO2: 22 mmol/L (ref 20–31)
CREATININE: 0.9 mg/dL (ref 0.70–1.25)
Calcium: 9.3 mg/dL (ref 8.6–10.3)
Chloride: 103 mmol/L (ref 98–110)
GLUCOSE: 90 mg/dL (ref 65–99)
Potassium: 4 mmol/L (ref 3.5–5.3)
SODIUM: 139 mmol/L (ref 135–146)
Total Protein: 7.7 g/dL (ref 6.1–8.1)

## 2015-08-20 LAB — IRON: Iron: 95 ug/dL (ref 50–180)

## 2015-08-21 LAB — HEPATITIS B SURFACE ANTIGEN: Hepatitis B Surface Ag: NEGATIVE

## 2015-08-21 LAB — HEPATITIS B CORE ANTIBODY, TOTAL: Hep B Core Total Ab: REACTIVE — AB

## 2015-08-21 LAB — HEPATITIS B SURFACE ANTIBODY,QUALITATIVE: HEP B S AB: POSITIVE — AB

## 2015-08-21 LAB — HIV ANTIBODY (ROUTINE TESTING W REFLEX): HIV 1&2 Ab, 4th Generation: NONREACTIVE

## 2015-08-21 LAB — HEPATITIS A ANTIBODY, TOTAL: Hep A Total Ab: REACTIVE — AB

## 2015-08-21 LAB — PROTIME-INR
INR: 1.17 (ref <1.50)
PROTHROMBIN TIME: 15 s (ref 11.6–15.2)

## 2015-08-21 LAB — ANA: ANA: NEGATIVE

## 2015-08-22 ENCOUNTER — Telehealth: Payer: Self-pay | Admitting: Pharmacy Technician

## 2015-08-24 LAB — HCV RNA, QUANT REAL-TIME PCR W/REFLEX
HCV RNA, PCR, QN (LOG): 5.4 {Log_IU}/mL — AB
HCV RNA, PCR, QN: 250000 IU/mL — ABNORMAL HIGH

## 2015-08-24 LAB — HCV RNA,LIPA RFLX NS5A DRUG RESIST

## 2015-08-27 ENCOUNTER — Ambulatory Visit (INDEPENDENT_AMBULATORY_CARE_PROVIDER_SITE_OTHER): Payer: Medicare HMO | Admitting: Internal Medicine

## 2015-08-27 ENCOUNTER — Encounter: Payer: Self-pay | Admitting: Internal Medicine

## 2015-08-27 VITALS — BP 111/73 | HR 94 | Temp 98.3°F | Ht 69.0 in | Wt 209.0 lb

## 2015-08-27 DIAGNOSIS — Z23 Encounter for immunization: Secondary | ICD-10-CM

## 2015-08-27 DIAGNOSIS — B182 Chronic viral hepatitis C: Secondary | ICD-10-CM

## 2015-08-27 LAB — HCV RNA NS5A DRUG RESISTANCE

## 2015-08-27 MED ORDER — LEDIPASVIR-SOFOSBUVIR 90-400 MG PO TABS: 1.0000 | ORAL_TABLET | Freq: Every day | ORAL | Status: DC

## 2015-08-27 NOTE — Patient Instructions (Signed)
Date 08/27/2015  Dear Mr Lynnette CaffeyCuthbertson, As discussed in the ID Clinic, your hepatitis C therapy will include the following medications:          Harvoni 90mg /400mg  tablet:           Take 1 tablet by mouth once daily   Please note that ALL MEDICATIONS WILL START ON THE SAME DATE for a total of 12 weeks. ---------------------------------------------------------------- Your HCV Treatment Start Date: TBA   Your HCV genotype:  1a    Liver Fibrosis: TBD    ---------------------------------------------------------------- YOUR PHARMACY CONTACT:   [facility] [address] Phone: [phone] Hours: Monday to Friday 7:30 am to 6:00 pm   Please always contact your pharmacy at least 3-4 business days before you run out of medications to ensure your next month's medication is ready or 1 week prior to running out if you receive it by mail.  Remember, each prescription is for 28 days. ---------------------------------------------------------------- GENERAL NOTES REGARDING YOUR HEPATITIS C MEDICATION:  SOFOSBUVIR/LEDIPASVIR (HARVONI): - Harvoni tablet is taken daily with OR without food. - The tablets are orange. - The tablets should be stored at room temperature.  - Acid reducing agents such as H2 blockers (ie. Pepcid (famotidine), Zantac (ranitidine), Tagamet (cimetidine), Axid (nizatidine) and proton pump inhibitors (ie. Prilosec (omeprazole), Protonix (pantoprazole), Nexium (esomeprazole), or Aciphex (rabeprazole)) can decrease effectiveness of Harvoni. Do not take until you have discussed with a health care provider.    -Antacids that contain magnesium and/or aluminum hydroxide (ie. Milk of Magensia, Rolaids, Gaviscon, Maalox, Mylanta, an dArthritis Pain Formula)can reduce absorption of Harvoni, so take them at least 4 hours before or after Harvoni.  -Calcium carbonate (calcium supplements or antacids such as Tums,  Caltrate, Os-Cal)needs to be taken at least 4 hours hours before or after Harvoni.  -St. [name]'s wort or any products that contain St. [name]'s wort like some herbal supplements  Please inform the office prior to starting any of these medications.  - The common side effects associated with Harvoni include:      1. Fatigue      2. Headache      3. Nausea      4. Diarrhea      5. Insomnia  Please note that this only lists the most common side effects and is NOT a comprehensive list of the potential side effects of these medications. For more information, please review the drug information sheets that come with your medication package from the pharmacy.  ---------------------------------------------------------------- GENERAL HELPFUL HINTS ON HCV THERAPY: 1. Stay well-hydrated. 2. Notify the ID Clinic of any changes in your other over-the-counter/herbal or prescription medications. 3. If you miss a dose of your medication, take the missed dose as soon as you remember. Return to your regular time/dose schedule the next day.  4.  Do not stop taking your medications without first talking with your healthcare provider. 5.  You may take Tylenol (acetaminophen), as long as the dose is less than 2000 mg (OR no more than 4 tablets of the Tylenol Extra Strengths 500mg  tablet) in 24 hours. 6.  You will see our pharmacist-specialist within the first 2 weeks of starting your medication. 7.  You will need to obtain routine labs around week 4 and12 weeks after starting and then 3 to 6 months after finishing Harvoni.    Staci RighterOMER, ROBERT, MD  [facility] [address] [place],  [place]  [address] [phone]

## 2015-08-27 NOTE — Progress Notes (Signed)
[facility]   CC: consideration for treatment for chronic hepatitis C  HPI:  +Christopher Mcfarland is a 69 y.o. male who presents for initial evaluation and management of chronic hepatitis C.  Patient tested positive over 10 years ago. Hepatitis C-associated risk factors present are: history of snorting drugs. Patient denies history of blood transfusion, IV drug abuse, multiple sexual partners, renal dialysis, sexual contact with person with liver disease, tattoos. Patient has had other studies performed. Results: hepatitis C RNA by PCR, result: positive. Patient has not had prior treatment for Hepatitis C. Patient does not have a past history of liver disease. Patient does not have a family history of liver disease. Patient does not  have associated signs or symptoms related to liver disease.  Labs reviewed and confirm chronic hepatitis C with a positive viral load.   Records reviewed from previous biopsy and he was F0, A2, genotype 1a.  He has been drug free about 2 years, no alcohol for years.   Never treated or started treatment.  Was seen by Dr. Madilyn FiremanHayes.      Patient does have documented immunity to Hepatitis A. Patient does have documented immunity to Hepatitis B.    Review of Systems:   Constitutional: negative for fevers, fatigue and malaise Musculoskeletal: negative for myalgias and arthralgias All other systems reviewed and are negative      Past Medical History  Diagnosis Date  . Hepatitis C 2003    Prior to Admission medications   Medication Sig Start Date End Date Taking? Authorizing Provider  amLODipine (NORVASC) 5 MG tablet Take 5 mg by mouth daily.   Yes Historical Provider, MD  simvastatin (ZOCOR) 20 MG tablet Take 20 mg by mouth daily.   Yes Historical Provider, MD  Ledipasvir-Sofosbuvir (HARVONI) 90-400 MG TABS Take 1 tablet by mouth daily. 08/27/15   Gardiner Barefootobert W Jaiah Weigel, MD    No Known Allergies  Social History  Substance Use Topics  .  Smoking status: Never Smoker   . Smokeless tobacco: Never Used  . Alcohol Use: 1.2 - 1.8 oz/week    2-3 Shots of liquor per week    Family History  Problem Relation Age of Onset  . Colon cancer Neg Hx   . Pancreatic cancer Neg Hx   . Rectal cancer Neg Hx   . Stomach cancer Neg Hx   no liver cancer, no liver cirrhosis   Objective:  Constitutional: in no apparent distress and alert,  Filed Vitals:   08/27/15 0935  BP: 111/73  Pulse: 94  Temp: 98.3 F (36.8 C)   Eyes: anicteric Cardiovascular: Cor RRR and No murmurs Respiratory: CTA B; normal respiratory effort Gastrointestinal: Bowel sounds are normal, liver is not enlarged, spleen is not enlarged Musculoskeletal: peripheral pulses normal, no pedal edema, no clubbing or cyanosis Skin: negatives: no rash; no porphyria cutanea tarda Lymphatic: no cervical lymphadenopathy   Laboratory Genotype: No results found for: HCVGENOTYPE HCV viral load: No results found for: HCVQUANT Lab Results  Component Value Date   WBC 3.8* 08/20/2015   HGB 14.1 08/20/2015   HCT 42.1 08/20/2015   MCV 86.4 08/20/2015   PLT 227 08/20/2015    Lab Results  Component Value Date   CREATININE 0.90 08/20/2015   BUN 14 08/20/2015   NA 139 08/20/2015   K 4.0 08/20/2015   CL 103 08/20/2015   CO2 22 08/20/2015    Lab Results  Component Value Date   ALT 45 08/20/2015  AST 41* 08/20/2015   ALKPHOS 52 08/20/2015     Labs and history reviewed and show CHILD-PUGH A  5-6 points: Child class A 7-9 points: Child class B 10-15 points: Child class C  Lab Results  Component Value Date   INR 1.17 08/20/2015   BILITOT 0.7 08/20/2015   ALBUMIN 3.9 08/20/2015     Assessment: New Patient with Chronic Hepatitis C genotype 1a, untreated.  I discussed with the patient the lab findings that confirm chronic hepatitis C as well as the natural history and progression of disease including about 30% of people who develop cirrhosis of the liver if left  untreated and once cirrhosis is established there is a 2-7% risk per year of liver cancer and liver failure.  I discussed the importance of treatment and benefits in reducing the risk, even if significant liver fibrosis exists.   Plan: 1) Patient counseled extensively on limiting acetaminophen to no more than 2 grams daily, avoidance of alcohol. 2) Transmission discussed with patient including sexual transmission, sharing razors and toothbrush.   3) Will need referral to gastroenterology if concern for cirrhosis 4) Will need referral for substance abuse counseling: No.; Further work up to include urine drug screen  No. 5) Will prescribe Harvoni for 12 weeks 6) Hepatitis A vaccine No. 7) Hepatitis B vaccine No. 8) Pneumovax vaccine if concern for cirrhosis 9) Further work up to include liver staging with elastography 10) will follow up after starting medication

## 2015-09-25 ENCOUNTER — Ambulatory Visit (HOSPITAL_COMMUNITY)
Admission: RE | Admit: 2015-09-25 | Discharge: 2015-09-25 | Disposition: A | Discharge status: Home / Self Care | Payer: Medicare HMO | Source: Ambulatory Visit | Attending: Internal Medicine | Admitting: Internal Medicine

## 2015-09-25 DIAGNOSIS — B182 Chronic viral hepatitis C: Secondary | ICD-10-CM | POA: Diagnosis not present

## 2015-09-25 DIAGNOSIS — R932 Abnormal findings on diagnostic imaging of liver and biliary tract: Secondary | ICD-10-CM | POA: Insufficient documentation

## 2015-10-08 MED FILL — *HARVONI 90-400 MG TABLET: 90-400 | 28 days supply | Qty: 28 | Fill #0

## 2015-10-09 ENCOUNTER — Encounter: Payer: Self-pay | Admitting: Pharmacy Technician

## 2015-10-28 NOTE — Telephone Encounter (Signed)
  Contacted about getting Hep C meds approved and what is needed

## 2015-10-30 ENCOUNTER — Ambulatory Visit: Payer: Medicare HMO | Admitting: Pharmacist Clinician (PhC)/ Clinical Pharmacy Specialist

## 2015-10-30 DIAGNOSIS — B182 Chronic viral hepatitis C: Secondary | ICD-10-CM

## 2015-10-30 MED FILL — *HARVONI 90-400 MG TABLET: 90-400 | 28 days supply | Qty: 28 | Fill #1

## 2015-10-30 NOTE — Progress Notes (Signed)
HPI: Christopher Mcfarland is a 70 y.o. male is here for the pharmacy visit with his hep C.  No results found for: HCVGENOTYPE, HEPCGENOTYPE  Allergies: No Known Allergies  Vitals:    Past Medical History: Past Medical History  Diagnosis Date  . Hepatitis C 2003    Social History: Social History   Social History  . Marital Status: Married    Spouse Name: N/A  . Number of Children: N/A  . Years of Education: N/A   Social History Main Topics  . Smoking status: Never Smoker   . Smokeless tobacco: Never Used  . Alcohol Use: 1.2 - 1.8 oz/week    2-3 Shots of liquor per week  . Drug Use: No     Comment: smoked marijuana "years ago"  . Sexual Activity: Not on file   Other Topics Concern  . Not on file   Social History Narrative    Labs: HEP B S AB (no units)  Date Value  08/20/2015 POS*   HEPATITIS B SURFACE AG (no units)  Date Value  08/20/2015 NEGATIVE    No results found for: HCVGENOTYPE, HEPCGENOTYPE  No flowsheet data found.  AST  Date Value  08/20/2015 41 U/L*  10/21/2009 32 units/L  08/15/2009 40 units/L*   ALT  Date Value  08/20/2015 45 U/L  10/21/2009 34 units/L  08/15/2009 46 units/L   INR (no units)  Date Value  08/20/2015 1.17    CrCl: CrCl cannot be calculated (Unknown ideal weight.).  Fibrosis Score: F0/1 as assessed by [facility]   Child-Pugh Score: Class A  Previous Treatment Regimen: Naive  Assessment: Christopher Mcfarland started his Harvoni on 1/26. He is doing very well on it without any side effects. No interaction issues were identified after doing his medication reconciliation. He has not missed any doses. Appts for labs and Dr. Luciana Axe have been set up.   Recommendations:  Cont Harvoni 1 PO qday x 3 mo F/u with labs and Dr. Zettie Cooley, [place], [place].D., BCPS, AAHIVP Clinical Infectious Disease Pharmacist [facility] 10/30/2015, 9:51 AM

## 2015-10-30 NOTE — Patient Instructions (Signed)
Cont Harvoni x 3 months Lab next week Dr. Luciana Axe in April

## 2015-11-06 ENCOUNTER — Other Ambulatory Visit: Payer: Medicare HMO

## 2015-11-06 DIAGNOSIS — B182 Chronic viral hepatitis C: Secondary | ICD-10-CM

## 2015-11-07 LAB — HEPATITIS C RNA QUANTITATIVE: HCV QUANT: NOT DETECTED [IU]/mL (ref <15)

## 2015-11-26 MED FILL — *HARVONI 90-400 MG TABLET: 90-400 | 28 days supply | Qty: 28 | Fill #2

## 2016-01-08 ENCOUNTER — Ambulatory Visit (INDEPENDENT_AMBULATORY_CARE_PROVIDER_SITE_OTHER): Payer: Medicare HMO | Admitting: Internal Medicine

## 2016-01-08 ENCOUNTER — Encounter: Payer: Self-pay | Admitting: Internal Medicine

## 2016-01-08 VITALS — BP 143/87 | HR 84 | Temp 97.7°F | Wt 204.0 lb

## 2016-01-08 DIAGNOSIS — K7400 Hepatic fibrosis, unspecified: Secondary | ICD-10-CM

## 2016-01-08 DIAGNOSIS — B182 Chronic viral hepatitis C: Secondary | ICD-10-CM

## 2016-01-08 DIAGNOSIS — R160 Hepatomegaly, not elsewhere classified: Secondary | ICD-10-CM | POA: Insufficient documentation

## 2016-01-08 NOTE — Assessment & Plan Note (Signed)
Will check an MRI to verify it is a hemangioma

## 2016-01-08 NOTE — Assessment & Plan Note (Signed)
Finished treatment, end of treatment lab today and r[facility] 3-[id number]

## 2016-01-08 NOTE — Assessment & Plan Note (Signed)
Minimal, no follow up needed for this

## 2016-01-08 NOTE — Progress Notes (Signed)
   Subjective:    Patient ID: Christopher Mcfarland, male    DOB: 1946/03/22, 70 y.o.   MRN: [id number]  HPI Here for follow up of HCV  Has genotype 1a, previous biopsy with F0A2, elastography F0/1 here for follow up after treatment completion. Early viral load was undetectable.  No issues.  Noted an area on the left hepatic lobe of unknown significance, query hemangioma.  No weight loss, no diarrhea.  No abdominal pain.     Review of Systems  Constitutional: Negative for fatigue.  Gastrointestinal: Negative for diarrhea.  Skin: Negative for rash.  Neurological: Negative for dizziness and headaches.       Objective:   Physical Exam  Constitutional: He appears well-developed and well-nourished. No distress.  Eyes: No scleral icterus.  Cardiovascular: Normal rate, regular rhythm and normal heart sounds.   No murmur heard. Lymphadenopathy:    He has no cervical adenopathy.  Skin: No rash noted.    Social History   Social History  . Marital Status: Married    Spouse Name: N/A  . Number of Children: N/A  . Years of Education: N/A   Occupational History  . Not on file.   Social History Main Topics  . Smoking status: Never Smoker   . Smokeless tobacco: Never Used  . Alcohol Use: 1.2 - 1.8 oz/week    2-3 Shots of liquor per week  . Drug Use: No     Comment: smoked marijuana "years ago"  . Sexual Activity: Not on file   Other Topics Concern  . Not on file   Social History Narrative        Assessment & Plan:

## 2016-01-09 LAB — HEPATITIS C RNA QUANTITATIVE: HCV QUANT: NOT DETECTED [IU]/mL (ref <15)

## 2016-04-13 ENCOUNTER — Ambulatory Visit: Payer: Medicare HMO | Admitting: Internal Medicine

## 2016-04-18 NOTE — Progress Notes (Signed)
Cardiology Office Note   Date:  04/19/2016   ID:  Christopher Mcfarland, DOB 12/20/1945, MRN [id number]  [facility]  Cardiologist:   Rollene Rotunda, MD   No chief complaint on file.     History of Present Illness: Christopher Mcfarland is a 70 y.o. male who presents for CMV evaluation. He needs a Therapist, sports form as he has a device that does not allow him to operate his car if cannot blow into the device.  He says that he has trouble particularly while he is operating car and has to do random exhalations. He says he oftentimes cannot work up enough breath to operate the device. Because of this he get a fine when that happens. He needs to have a physicians waiver. He actually has seen a pulmonologist who says he has some mild asthma. He's not had any cardiac problems. He actually exercises routinely. The patient denies any new symptoms such as chest discomfort, neck or arm discomfort. There has been no new shortness of breath, PND or orthopnea. There have been no reported palpitations, presyncope or syncope.    Past Medical History:  Diagnosis Date  . Essential hypertension   . Hepatitis C 2003   Treated  . Hyperlipidemia     Past Surgical History:  Procedure Laterality Date  . CARPAL TUNNEL RELEASE     bilat 1999  . HEMORRHOID SURGERY     1999/4 polyps removed rectum  . TRIGGER FINGER RELEASE     right hand 1999     Current Outpatient Prescriptions  Medication Sig Dispense Refill  . amLODipine (NORVASC) 5 MG tablet Take 5 mg by mouth daily.    . meloxicam (MOBIC) 7.5 MG tablet Take 1 tablet by mouth daily.    . simvastatin (ZOCOR) 20 MG tablet Take 20 mg by mouth daily.     No current facility-administered medications for this visit.     Allergies:   Review of patient's allergies indicates no known allergies.    Social History:  The patient  reports that he has never smoked. He has never used smokeless tobacco. He reports that he drinks about  1.2 - 1.8 oz of alcohol per week . He reports that he does not use drugs.   Family History:  The patient's family history is positive for a brother with early onset CAD    ROS:  Please see the history of present illness.   Otherwise, review of systems are positive for snoring.   All other systems are reviewed and negative.    PHYSICAL EXAM: VS:  BP (!) 148/98 (BP Location: Right Arm, Patient Position: Sitting, Cuff Size: Normal)   Pulse 82   Ht  (1.753 m)   Wt 206 lb 8 oz (93.7 kg)   BMI 30.49 kg/m  , BMI Body mass index is 30.49 kg/m. GENERAL:  Well appearing HEENT:  Pupils equal round and reactive, fundi not visualized, oral mucosa unremarkable NECK:  No jugular venous distention, waveform within normal limits, carotid upstroke brisk and symmetric, no bruits, no thyromegaly LYMPHATICS:  No cervical, inguinal adenopathy LUNGS:  Clear to auscultation bilaterally BACK:  No CVA tenderness CHEST:  Unremarkable HEART:  PMI not displaced or sustained,S1 and S2 within normal limits, no S3, no S4, no clicks, no rubs, no murmurs ABD:  Flat, positive bowel sounds normal in frequency in pitch, no bruits, no rebound, no guarding, no midline pulsatile mass, no hepatomegaly, no splenomegaly EXT:  2 plus pulses throughout, no edema, no cyanosis no clubbing SKIN:  No rashes no nodules NEURO:  Cranial nerves II through XII grossly intact, motor grossly intact throughout PSYCH:  Cognitively intact, oriented to person place and time    EKG:  EKG is ordered today. The ekg ordered today demonstrates sinus rhythm, rate 83, axis within normal limits, intervals within normal limits, no acute ST-T wave changes.   Recent Labs: 08/20/2015: ALT 45; BUN 14; Creat 0.90; Hemoglobin 14.1; Platelets 227; Potassium 4.0; Sodium 139    Lipid Panel    Component Value Date/Time   CHOL 203 (H) 10/21/2009 2126   TRIG 104 10/21/2009 2126   HDL 54 10/21/2009 2126   CHOLHDL 3.8 Ratio 10/21/2009 2126   VLDL  21 10/21/2009 2126   LDLCALC 128 (H) 10/21/2009 2126      Wt Readings from Last 3 Encounters:  04/19/16 206 lb 8 oz (93.7 kg)  01/08/16 204 lb (92.5 kg)  08/27/15 209 lb (94.8 kg)      Other studies Reviewed: Additional studies/ records that were reviewed today include: Pulmonary function testing. Review of the above records demonstrates:  Please see elsewhere in the note.     ASSESSMENT AND PLAN:  DYSPNEA:  The patient reports this but has absolutely no cardiovascular findings. He has an excellent exercise regimen and no symptoms with this. He has minimal cardiovascular risk factors. He doesn't have history. This point however I don't think further cardiovascular testing is suggested. He needs continued primary risk reduction.  HTN:  His blood pressure slightly elevated today but he says this is unusual. He'll keep an eye on this area no change in therapy is indicated.    SNORING:  He is due to have a sleep study.     Current medicines are reviewed at length with the patient today.  The patient does not have concerns regarding medicines.  The following changes have been made:  no change  Labs/ tests ordered today include:   Orders Placed This Encounter  Procedures  . EKG 12-Lead     Disposition:   FU with me as needed.      Signed, Rollene RotundaJames Nikola Marone, MD  04/19/2016 9:41 AM    [facility]

## 2016-04-19 ENCOUNTER — Encounter (INDEPENDENT_AMBULATORY_CARE_PROVIDER_SITE_OTHER): Payer: Self-pay

## 2016-04-19 ENCOUNTER — Ambulatory Visit (INDEPENDENT_AMBULATORY_CARE_PROVIDER_SITE_OTHER): Payer: Medicare HMO | Admitting: Cardiology

## 2016-04-19 ENCOUNTER — Encounter: Payer: Self-pay | Admitting: Cardiology

## 2016-04-19 VITALS — BP 148/98 | HR 82 | Ht 69.0 in | Wt 206.5 lb

## 2016-04-19 DIAGNOSIS — M25512 Pain in left shoulder: Secondary | ICD-10-CM | POA: Diagnosis not present

## 2016-04-19 DIAGNOSIS — R0600 Dyspnea, unspecified: Secondary | ICD-10-CM | POA: Diagnosis not present

## 2016-04-19 NOTE — Patient Instructions (Signed)
Your physician recommends that you schedule a follow-up appointment in: As Needed    

## 2016-04-28 ENCOUNTER — Telehealth: Payer: Self-pay | Admitting: *Deleted

## 2016-04-28 NOTE — Telephone Encounter (Signed)
DOT paperwork placed at the front desk for pick up, pt aware

## 2016-06-02 ENCOUNTER — Ambulatory Visit (INDEPENDENT_AMBULATORY_CARE_PROVIDER_SITE_OTHER): Payer: Medicare HMO | Admitting: Pulmonary Disease

## 2016-06-02 ENCOUNTER — Encounter: Payer: Self-pay | Admitting: Pulmonary Disease

## 2016-06-02 ENCOUNTER — Encounter (INDEPENDENT_AMBULATORY_CARE_PROVIDER_SITE_OTHER): Payer: Self-pay

## 2016-06-02 VITALS — BP 130/82 | HR 88 | Ht 69.0 in | Wt 208.8 lb

## 2016-06-02 DIAGNOSIS — R0600 Dyspnea, unspecified: Secondary | ICD-10-CM | POA: Diagnosis not present

## 2016-06-02 NOTE — Progress Notes (Signed)
Christopher Mcfarland    [id number]    1945/12/23  Primary Care Physician:Christopher Artelia LarocheM Williams, MD  Ref[facility] Physician: [facility] [address] [place] [id number]  Chief complaint:  Consult for PFT evaluation.  HPI: Christopher Mcfarland is a 70 year old with past medical history as below. He has an a Pharmacist, communitygnition interlock device on his car placed by the [facility] about a year ago. He reports inability to low consistently into the device to be able to unlock it. He is requesting PFT documentation and we'll perform for this. He does not report any dyspnea on exertion or at rest. He has chronic daily cough with occasional sputum production. He denies any wheezing, chest pain, palpitations. He was seen by a pulmonologist and  and had PFTs done. He was told that he had mild asthma and was given an albuterol inhaler. He requires independent another separate PFT evaluation to be reviewed by the DMV. He was also seen by Dr. Antoine Mcfarland from cardiology last month but was not found to have any cardiac risk factors.  He has history of hypertension, snoring. He is due to have a sleep study and is awaiting insurance approval. He is a never smoker, no known exposures at work or at home.  Outpatient Encounter Prescriptions as of 06/02/2016  Medication Sig  . amLODipine (NORVASC) 5 MG tablet Take 5 mg by mouth daily.  . meloxicam (MOBIC) 7.5 MG tablet Take 1 tablet by mouth daily.  . simvastatin (ZOCOR) 20 MG tablet Take 20 mg by mouth daily.   No facility-administered encounter medications on file as of 06/02/2016.     Allergies as of 06/02/2016  . (No Known Allergies)    Past Medical History:  Diagnosis Date  . Essential hypertension   . Hepatitis C 2003   Treated  . Hyperlipidemia     Past Surgical History:  Procedure Laterality Date  . CARPAL TUNNEL RELEASE     bilat 1999  . HEMORRHOID SURGERY     1999/4 polyps removed rectum  . TRIGGER FINGER RELEASE     right hand  1999    Family History  Problem Relation Age of Onset  . CAD Brother 7065  . Colon cancer Neg Hx   . Pancreatic cancer Neg Hx   . Rectal cancer Neg Hx   . Stomach cancer Neg Hx     Social History   Social History  . Marital status: Married    Spouse name: N/A  . Number of children: 4  . Years of education: N/A   Occupational History  . Christopher Mcfarland    Social History Main Topics  . Smoking status: Never Smoker  . Smokeless tobacco: Never Used  . Alcohol use 1.2 - 1.8 oz/week    2 - 3 Shots of liquor per week  . Drug use: No     Comment: smoked marijuana "years ago"  . Sexual activity: Not on file   Other Topics Concern  . Not on file   Social History Narrative   Lives at home with wife.      Review of systems: Review of Systems  Constitutional: Negative for fever and chills.  HENT: Negative.   Eyes: Negative for blurred vision.  Respiratory: as per HPI  Cardiovascular: Negative for chest pain and palpitations.  Gastrointestinal: Negative for vomiting, diarrhea, blood per rectum. Genitourinary: Negative for dysuria, urgency, frequency and hematuria.  Musculoskeletal: Negative for myalgias, back pain and joint pain.  Skin: Negative for itching and rash.  Neurological: Negative for dizziness, tremors, focal weakness, seizures and loss of consciousness.  Endo/Heme/Allergies: Negative for environmental allergies.  Psychiatric/Behavioral: Negative for depression, suicidal ideas and hallucinations.  All other systems reviewed and are negative.   Physical Exam: Blood pressure 130/82, pulse 88, height 5\' 9"  (1.753 m), weight 94.7 kg (208 lb 12.8 oz), SpO2 98 %. Gen:      No acute distress HEENT:  EOMI, sclera anicteric Neck:     No masses; no thyromegaly Lungs:    Clear to auscultation bilaterally; normal respiratory effort CV:         Regular rate and rhythm; no murmurs Abd:      + bowel sounds; soft, non-tender; no palpable masses, no distension Ext:    No edema;  adequate peripheral perfusion Skin:      Warm and dry; no rash Neuro: alert and oriented x 3 Psych: normal mood and affect  Data Reviewed:  Assessment:  Dyspnea: Needs PFTs for ignition interlock waiver form. He is already had a PFT in [place] which apparently showed mild asthma. I'll get records of this for review and schedule him for repeat PFTs here.   HTN Stable  Snoring On schedule to get sleep study done  Plan/Recommendations: - Order PFTs  Chilton Greathouse MD [facility] Pager [phone] 06/02/2016, 9:58 AM  CC: Clinic, General Medical

## 2016-06-02 NOTE — Patient Instructions (Signed)
We'll schedule you for PFTs to be done as soon as possible.  Review and follow-up after PFTs.

## 2016-06-03 ENCOUNTER — Encounter (INDEPENDENT_AMBULATORY_CARE_PROVIDER_SITE_OTHER): Payer: Medicare HMO | Admitting: Pulmonary Disease

## 2016-06-03 ENCOUNTER — Telehealth: Payer: Self-pay | Admitting: Pulmonary Disease

## 2016-06-03 DIAGNOSIS — R0600 Dyspnea, unspecified: Secondary | ICD-10-CM

## 2016-06-03 LAB — PULMONARY FUNCTION TEST
DL/VA % pred: 91 %
DL/VA: 4.17 ml/min/mmHg/L
DLCO UNC: 20.48 ml/min/mmHg
DLCO cor % pred: 66 %
DLCO cor: 21.17 ml/min/mmHg
DLCO unc % pred: 64 %
FEF 25-75 POST: 3.02 L/s
FEF 25-75 Pre: 3.5 L/sec
FEF2575-%Change-Post: -13 %
FEF2575-%PRED-PRE: 140 %
FEF2575-%Pred-Post: 120 %
FEV1-%Change-Post: -7 %
FEV1-%PRED-POST: 92 %
FEV1-%Pred-Pre: 99 %
FEV1-POST: 2.66 L
FEV1-PRE: 2.86 L
FEV1FVC-%CHANGE-POST: 4 %
FEV1FVC-%Pred-Pre: 113 %
FEV6-%CHANGE-POST: -10 %
FEV6-%Pred-Post: 80 %
FEV6-%Pred-Pre: 89 %
FEV6-PRE: 3.26 L
FEV6-Post: 2.9 L
FEV6FVC-%Change-Post: 0 %
FEV6FVC-%PRED-PRE: 103 %
FEV6FVC-%Pred-Post: 104 %
FVC-%CHANGE-POST: -11 %
FVC-%PRED-PRE: 86 %
FVC-%Pred-Post: 76 %
FVC-PRE: 3.26 L
FVC-Post: 2.9 L
POST FEV1/FVC RATIO: 91 %
Post FEV6/FVC ratio: 100 %
Pre FEV1/FVC ratio: 88 %
Pre FEV6/FVC Ratio: 100 %
RV % PRED: 134 %
RV: 3.22 L
TLC % PRED: 95 %
TLC: 6.58 L

## 2016-06-03 NOTE — Telephone Encounter (Signed)
Pt seen by PM yesterday 9.20.17 for ignition interlock waiver appt PFT done today 9.21.17 Form filled out and signed by PM and faxed back to (along with copy of PFT):   Division of Motor Vehicles Ignition Interlock Unit Fax: [phone]  [name] to inform pt of the above and to see if he would like copy mailed to him

## 2016-06-04 NOTE — Telephone Encounter (Signed)
Pt was actually in the office lobby - Johny DrillingChan came to me Made copy of the DMV form and PFT to give to pt with a message that he can disregard the VM I had left for him Will sign off.

## 2016-09-02 ENCOUNTER — Encounter: Payer: Self-pay | Admitting: Family

## 2016-09-02 ENCOUNTER — Ambulatory Visit (INDEPENDENT_AMBULATORY_CARE_PROVIDER_SITE_OTHER): Payer: Commercial Managed Care - HMO | Admitting: Family

## 2016-09-02 DIAGNOSIS — M19042 Primary osteoarthritis, left hand: Secondary | ICD-10-CM

## 2016-09-02 DIAGNOSIS — I10 Essential (primary) hypertension: Secondary | ICD-10-CM | POA: Diagnosis not present

## 2016-09-02 DIAGNOSIS — E782 Mixed hyperlipidemia: Secondary | ICD-10-CM

## 2016-09-02 DIAGNOSIS — M19041 Primary osteoarthritis, right hand: Secondary | ICD-10-CM | POA: Diagnosis not present

## 2016-09-02 DIAGNOSIS — E785 Hyperlipidemia, unspecified: Secondary | ICD-10-CM | POA: Insufficient documentation

## 2016-09-02 MED ORDER — AMLODIPINE BESYLATE 5 MG PO TABS: 5.0000 mg | ORAL_TABLET | Freq: Every day | ORAL | 0 refills | Status: DC

## 2016-09-02 MED ORDER — SIMVASTATIN 20 MG PO TABS: 20.0000 mg | ORAL_TABLET | Freq: Every day | ORAL | 0 refills | Status: DC

## 2016-09-02 NOTE — Assessment & Plan Note (Signed)
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 Increase meloxicam. Continue Tylenol as needed. Initiate home therapy with ice/moist heat as needed. Consider imaging if symptoms worsen or do not improve. Recommend starting tumeric as potential for decreasing inflammation. Continue to monitor.

## 2016-09-02 NOTE — Assessment & Plan Note (Signed)
Previously diagnosed with hyperlipidemia and currently maintained on simvastatin with no adverse side effects. Obtain lipid profile during physical. Continue current dosage of simvastatin pending lipid profile results.

## 2016-09-02 NOTE — Assessment & Plan Note (Signed)
Blood pressure appears stable below goal 140/90 with current regimen and no adverse side effects. Denies worse headache of life with no new symptoms of end organ damage noted upon physical exam. Eye exam has been completed this year. Continue to monitor blood pressure at home. Continue current dosage of amlodipine. Follow sodium diet.

## 2016-09-02 NOTE — Patient Instructions (Signed)
Thank you for choosing [facility].  SUMMARY AND INSTRUCTIONS:  Nice to meet you!  Happy Holidays!  Ice /  Most heat to the hands for about 20 minutes every 2 hours as needed and after activity and before bed.  Continue to monitor your blood pressure at home.   Schedule a time for your physical at your convenience.   Medication:  Please continue to take the amlodipine as prescribed.  Try the increased dosage of meloxicam - no more than 15 mg in a day with no additional ibuprofen or Aleve.   Consider Tumeric 500 mg daily for inflammation.  Your prescription(s) have been submitted to your pharmacy or been printed and provided for you. Please take as directed and contact our office if you believe you are having problem(s) with the medication(s) or have any questions.  Follow up:  If your symptoms worsen or fail to improve, please contact our office for further instruction, or in case of emergency go directly to the emergency room at the closest medical facility.    Osteoarthritis Osteoarthritis is a type of arthritis that affects tissue that covers the ends of bones in joints (cartilage). Cartilage acts as a cushion between the bones and helps them move smoothly. Osteoarthritis results when cartilage in the joints gets worn down. Osteoarthritis is sometimes called "wear and tear" arthritis. Osteoarthritis is the most common form of arthritis. It often occurs in older people. It is a condition that gets worse over time (a progressive condition). Joints that are most often affected by this condition are in:  Fingers.  Toes.  Hips.  Knees.  Spine, including neck and lower back. What are the causes? This condition is caused by age-related wearing down of cartilage that covers the ends of bones. What increases the risk? The following factors may make you more likely to develop this condition:  Older age.  Being overweight or obese.  Overuse of joints, such as in  athletes.  Past injury of a joint.  Past surgery on a joint.  Family history of osteoarthritis. What are the signs or symptoms? The main symptoms of this condition are pain, swelling, and stiffness in the joint. The joint may lose its shape over time. Small pieces of bone or cartilage may break off and float inside of the joint, which may cause more pain and damage to the joint. Small deposits of bone (osteophytes) may grow on the edges of the joint. Other symptoms may include:  A grating or scraping feeling inside the joint when you move it.  Popping or creaking sounds when you move. Symptoms may affect one or more joints. Osteoarthritis in a major joint, such as your knee or hip, can make it painful to walk or exercise. If you have osteoarthritis in your hands, you might not be able to grip items, twist your hand, or control small movements of your hands and fingers (fine motor skills). How is this diagnosed? This condition may be diagnosed based on:  Your medical history.  A physical exam.  Your symptoms.  X-rays of the affected joint(s).  Blood tests to rule out other types of arthritis. How is this treated? There is no cure for this condition, but treatment can help to control pain and improve joint function. Treatment plans may include:  A prescribed exercise program that allows for rest and joint relief. You may work with a physical therapist.  A weight control plan.  Pain relief techniques, such as:  Applying heat and cold to  the joint.  Electric pulses delivered to nerve endings under the skin (transcutaneous electrical nerve stimulation, or TENS).  Massage.  Certain nutritional supplements.  NSAIDs or prescription medicines to help relieve pain.  Medicine to help relieve pain and inflammation (corticosteroids). This can be given by mouth (orally) or as an injection.  Assistive devices, such as a brace, wrap, splint, specialized glove, or cane.  Surgery, such  as:  An osteotomy. This is done to reposition the bones and relieve pain or to remove loose pieces of bone and cartilage.  Joint replacement surgery. You may need this surgery if you have very bad (advanced) osteoarthritis. Follow these instructions at home: Activity  Rest your affected joints as directed by your health care provider.  Do not drive or use heavy machinery while taking prescription pain medicine.  Exercise as directed. Your health care provider or physical therapist may recommend specific types of exercise, such as:  Strengthening exercises. These are done to strengthen the muscles that support joints that are affected by arthritis. They can be performed with weights or with exercise bands to add resistance.  Aerobic activities. These are exercises, such as brisk walking or water aerobics, that get your heart pumping.  Range-of-motion activities. These keep your joints easy to move.  Balance and agility exercises. Managing pain, stiffness, and swelling  If directed, apply heat to the affected area as often as told by your health care provider. Use the heat source that your health care provider recommends, such as a moist heat pack or a heating pad.  If you have a removable assistive device, remove it as told by your health care provider.  Place a towel between your skin and the heat source. If your health care provider tells you to keep the assistive device on while you apply heat, place a towel between the assistive device and the heat source.  Leave the heat on for 20-30 minutes.  Remove the heat if your skin turns bright red. This is especially important if you are unable to feel pain, heat, or cold. You may have a greater risk of getting burned.  If directed, put ice on the affected joint:  If you have a removable assistive device, remove it as told by your health care provider.  Put ice in a plastic bag.  Place a towel between your skin and the bag. If your  health care provider tells you to keep the assistive device on during icing, place a towel between the assistive device and the bag.  Leave the ice on for 20 minutes, 2-3 times a day. General instructions  Take over-the-counter and prescription medicines only as told by your health care provider.  Maintain a healthy weight. Follow instructions from your health care provider for weight control. These may include dietary restrictions.  Do not use any products that contain nicotine or tobacco, such as cigarettes and e-cigarettes. These can delay bone healing. If you need help quitting, ask your health care provider.  Use assistive devices as directed by your health care provider.  Keep all follow-up visits as told by your health care provider. This is important. Where to find more information:  [employer]culoskeletal and Skin Diseases: www.niams.http://www.myers.net/nih.gov  [employer]ging: https://walker.com/www.nia.nih.gov  American College of Rheumatology: www.rheumatology.org Contact a health care provider if:  Your skin turns red.  You develop a rash.  You have pain that gets worse.  You have a fever along with joint or muscle aches. Get  help right away if:  You lose a lot of weight.  You suddenly lose your appetite.  You have night sweats. Summary  Osteoarthritis is a type of arthritis that affects tissue covering the ends of bones in joints (cartilage).  This condition is caused by age-related wearing down of cartilage that covers the ends of bones.  The main symptom of this condition is pain, swelling, and stiffness in the joint.  There is no cure for this condition, but treatment can help to control pain and improve joint function. This information is not intended to replace advice given to you by your health care provider. Make sure you discuss any questions you have with your health care provider. Document Released: 08/30/2005 Document Revised: 05/03/2016  Document Reviewed: 05/03/2016 Elsevier Interactive Patient Education  2017 [employer].

## 2016-09-02 NOTE — Progress Notes (Signed)
Subjective:    Patient ID: Christopher Mcfarland, male    DOB: 09-26-1945, 70 y.o.   MRN: [id number]  Chief Complaint  Patient presents with  . Establish Care    wants to check to see if he has arthritis, hands gets stiff at night, needs refills of medications    HPI:  Christopher Mcfarland is a 70 y.o. male who  has a past medical history of Essential hypertension; Hepatitis C (2003); and Hyperlipidemia. and presents today for an office visit to establish care.   1.) Hypertension - Currently maintained on amlodipine. Reports taking his medications as prescribed and denies adverse side effects. Blood pressure generally runs a little lower at home. Denies worst headache of life or symptoms of end organ damage. Working on following a low sodium diet.  BP Readings from Last 3 Encounters:  09/02/16 136/84  06/02/16 130/82  04/19/16 (!) 148/98    2.) Hyperlipidemia - Currently maintained on simvastatin. Reports taking the medication as prescribed and denies adverse side effects or myalgias. Denies chest pain, shortness of breath or heart palpitations.   Lab Results  Component Value Date   CHOL 203 (H) 10/21/2009   HDL 54 10/21/2009   LDLCALC 128 (H) 10/21/2009   TRIG 104 10/21/2009   CHOLHDL 3.8 Ratio 10/21/2009    3.) Hand stiffness - This is a new problem. Associated symptom of stiffness and mild discomfort located in his bilateral hands that has been going on for several months. Modifying factors include Tylenol and meloxicam. Reports the Tylenol has helped some but the meloxicam has not. Denies any specific trauma. 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 No other joint pains. Denies numbness and tingling. Has had carpal tunnel release in his bilateral hands in the past. Right hand dominant.    No Known Allergies    Outpatient Medications Prior to Visit  Medication Sig Dispense Refill  . amLODipine (NORVASC) 5 MG tablet Take 5 mg by  mouth daily.    . meloxicam (MOBIC) 7.5 MG tablet Take 1 tablet by mouth daily.    . simvastatin (ZOCOR) 20 MG tablet Take 20 mg by mouth daily.     No facility-administered medications prior to visit.      Past Medical History:  Diagnosis Date  . Essential hypertension   . Hepatitis C 2003   Treated  . Hyperlipidemia       Past Surgical History:  Procedure Laterality Date  . CARPAL TUNNEL RELEASE     bilat 1999  . HEMORRHOID SURGERY     1999/4 polyps removed rectum  . TRIGGER FINGER RELEASE     right hand 1999      Family History  Problem Relation Age of Onset  . CAD Brother 8065  . Kidney cancer Mother   . Bone cancer Father   . Colon cancer Neg Hx   . Pancreatic cancer Neg Hx   . Rectal cancer Neg Hx   . Stomach cancer Neg Hx       Social History   Social History  . Marital status: Married    Spouse name: N/A  . Number of children: 4  . Years of education: 12   Occupational History  . [employer] Ske[employer]ter    Social History Main Topics  . Smoking status: Never Smoker  . Smokeless tobacco: Never Used  . Alcohol use 1.2 - 1.8 oz/week    2 - 3 Shots of  liquor per week  . Drug use: No     Comment: smoked marijuana "years ago"  . Sexual activity: Not on file   Other Topics Concern  . Not on file   Social History Narrative   Fun: Play pool, yard work, gym, and paint      Review of Systems  Constitutional: Negative for chills and fever.  Eyes:       Negative for changes in vision  Respiratory: Negative for cough, chest tightness and wheezing.   Cardiovascular: Negative for chest pain, palpitations and leg swelling.  Musculoskeletal: Positive for arthralgias.  Neurological: Negative for dizziness, weakness, light-headedness and numbness.       Objective:    BP 136/84 (BP Location: Left Arm, Patient Position: Sitting, Cuff Size: Large)   Pulse 93   Temp 98.3 F (36.8 C) (Oral)   Resp 16   Ht 5\' 9"  (1.753 m)   Wt 200 lb 6.4 oz (90.9 kg)   SpO2 97%    BMI 29.59 kg/m  Nursing note and vital signs reviewed.  Physical Exam  Constitutional: He is oriented to person, place, and time. He appears well-developed and well-nourished. No distress.  Cardiovascular: Normal rate, regular rhythm, normal heart sounds and intact distal pulses.   Pulmonary/Chest: Effort normal and breath sounds normal.  Musculoskeletal:  Bilateral hands - no obvious discoloration or edema. There is mild deformity noted of the left DIP joints. No tenderness or crepitus. Range of motion within normal limits. Strength is normal. Distal pulses and sensation are intact and appropriate.  Neurological: He is alert and oriented to person, place, and time.  Skin: Skin is warm and dry.  Psychiatric: He has a normal mood and affect. His behavior is normal. Judgment and thought content normal.        Assessment & Plan:   Problem List Items Addressed This Visit      Cardiovascular and Mediastinum   Essential hypertension    Blood pressure appears stable below goal 140/90 with current regimen and no adverse side effects. Denies worse headache of life with no new symptoms of end organ damage noted upon physical exam. Eye exam has been completed this year. Continue to monitor blood pressure at home. Continue current dosage of amlodipine. Follow sodium diet.      Relevant Medications   amLODipine (NORVASC) 5 MG tablet   simvastatin (ZOCOR) 20 MG tablet     Musculoskeletal and Integument   Osteoarthritis of both hands    Symptoms and exam consistent with osteoarthritis of both hands most likely related to 20+ year history of physical labor as a Education administratorpainter. Increase meloxicam. Continue Tylenol as needed. Initiate home therapy with ice/moist heat as needed. Consider imaging if symptoms worsen or do not improve. Recommend starting tumeric as potential for decreasing inflammation. Continue to monitor.        Other   Hyperlipidemia    Previously diagnosed with hyperlipidemia and  currently maintained on simvastatin with no adverse side effects. Obtain lipid profile during physical. Continue current dosage of simvastatin pending lipid profile results.      Relevant Medications   amLODipine (NORVASC) 5 MG tablet   simvastatin (ZOCOR) 20 MG tablet       I have discontinued Mr. Farooq's meloxicam. I have also changed his amLODipine and simvastatin.   Meds ordered this encounter  Medications  . amLODipine (NORVASC) 5 MG tablet    Sig: Take 1 tablet (5 mg total) by mouth daily.    Dispense:  90 tablet    Refill:  0    Order Specific Question:   Supervising Provider    Answer:   Hillard Danker A [[id number]]  . simvastatin (ZOCOR) 20 MG tablet    Sig: Take 1 tablet (20 mg total) by mouth daily.    Dispense:  90 tablet    Refill:  0    Order Specific Question:   Supervising Provider    Answer:   Hillard Danker A [[id number]]     Follow-up: Return if symptoms worsen or fail to improve.  Jeanine Luz, FNP

## 2016-11-22 ENCOUNTER — Ambulatory Visit (INDEPENDENT_AMBULATORY_CARE_PROVIDER_SITE_OTHER): Payer: Medicare HMO | Admitting: Family

## 2016-11-22 ENCOUNTER — Encounter: Payer: Self-pay | Admitting: Family

## 2016-11-22 ENCOUNTER — Other Ambulatory Visit (INDEPENDENT_AMBULATORY_CARE_PROVIDER_SITE_OTHER): Payer: Medicare HMO

## 2016-11-22 VITALS — BP 140/96 | HR 94 | Temp 97.8°F | Resp 16 | Ht 69.0 in | Wt 195.0 lb

## 2016-11-22 DIAGNOSIS — Z0001 Encounter for general adult medical examination with abnormal findings: Secondary | ICD-10-CM | POA: Insufficient documentation

## 2016-11-22 DIAGNOSIS — I10 Essential (primary) hypertension: Secondary | ICD-10-CM | POA: Diagnosis not present

## 2016-11-22 DIAGNOSIS — Z125 Encounter for screening for malignant neoplasm of prostate: Secondary | ICD-10-CM | POA: Diagnosis not present

## 2016-11-22 DIAGNOSIS — Z0000 Encounter for general adult medical examination without abnormal findings: Secondary | ICD-10-CM

## 2016-11-22 DIAGNOSIS — E782 Mixed hyperlipidemia: Secondary | ICD-10-CM

## 2016-11-22 LAB — COMPREHENSIVE METABOLIC PANEL
ALBUMIN: 4.4 g/dL (ref 3.5–5.2)
ALK PHOS: 68 U/L (ref 39–117)
ALT: 20 U/L (ref 0–53)
AST: 20 U/L (ref 0–37)
BILIRUBIN TOTAL: 0.4 mg/dL (ref 0.2–1.2)
BUN: 15 mg/dL (ref 6–23)
CO2: 30 mEq/L (ref 19–32)
Calcium: 10 mg/dL (ref 8.4–10.5)
Chloride: 104 mEq/L (ref 96–112)
Creatinine, Ser: 0.96 mg/dL (ref 0.40–1.50)
GFR: 99.88 mL/min (ref >60.00)
GLUCOSE: 97 mg/dL (ref 70–99)
Potassium: 4.3 mEq/L (ref 3.5–5.1)
SODIUM: 140 meq/L (ref 135–145)
TOTAL PROTEIN: 7.9 g/dL (ref 6.0–8.3)

## 2016-11-22 LAB — CBC
HCT: 42.2 % (ref 39.0–52.0)
HEMOGLOBIN: 13.9 g/dL (ref 13.0–17.0)
MCHC: 33.1 g/dL (ref 30.0–36.0)
MCV: 86.8 fl (ref 78.0–100.0)
Platelets: 199 10*3/uL (ref 150.0–400.0)
RBC: 4.86 Mil/uL (ref 4.22–5.81)
RDW: 14.8 % (ref 11.5–15.5)
WBC: 4.4 10*3/uL (ref 4.0–10.5)

## 2016-11-22 LAB — LIPID PANEL
CHOLESTEROL: 174 mg/dL (ref 0–200)
HDL: 38.2 mg/dL — ABNORMAL LOW (ref >39.00)
LDL Cholesterol: 108 mg/dL — ABNORMAL HIGH (ref 0–99)
NONHDL: 135.4
Total CHOL/HDL Ratio: 5
Triglycerides: 139 mg/dL (ref 0.0–149.0)
VLDL: 27.8 mg/dL (ref 0.0–40.0)

## 2016-11-22 NOTE — Assessment & Plan Note (Signed)
Currently maintained on simvastatin with no adverse side effects. Obtain lipid profile. Continue current dosage of simvastatin pending lipid profile results. 

## 2016-11-22 NOTE — Assessment & Plan Note (Signed)
Reviewed and updated patient's medical, surgical, family and social history. Medications and allergies were also reviewed. Basic screenings for depression, activities of daily living, hearing, cognition and safety were performed. Provider list was updated and health plan was provided to the patient.  

## 2016-11-22 NOTE — Patient Instructions (Addendum)
Thank you for choosing [facility].  SUMMARY AND INSTRUCTIONS:  Medication:  Keep taking medication as prescribed.   Labs:  Please stop by the lab on the lower level of the building for your blood work. Your results will be released to MyChart (or called to you) after review, usually within 72 hours after test completion. If any changes need to be made, you will be notified at that same time.  1.) The lab is open from 7:30am to 5:30 pm Monday-Friday 2.) No appointment is necessary 3.) Fasting (if needed) is 6-8 hours after food and drink; black coffee and water are okay   Follow up:  If your symptoms worsen or fail to improve, please contact our office for further instruction, or in case of emergency go directly to the emergency room at the closest medical facility.   Health Maintenance  Topic Date Due  . PNA vac Low Risk Adult (1 of 2 - PCV13) 11/29/2012  . TETANUS/TDAP  10/22/2019  . COLONOSCOPY  06/12/2024  . INFLUENZA VACCINE  Completed  . Hepatitis C Screening  Completed    Health Maintenance, Male A healthy lifestyle and preventive care is important for your health and wellness. Ask your health care provider about what schedule of regular examinations is right for you. What should I know about weight and diet?  Eat a Healthy Diet  Eat plenty of vegetables, fruits, whole grains, low-fat dairy products, and lean protein.  Do not eat a lot of foods high in solid fats, added sugars, or salt. Maintain a Healthy Weight  Regular exercise can help you achieve or maintain a healthy weight. You should:  Do at least 150 minutes of exercise each week. The exercise should increase your heart rate and make you sweat (moderate-intensity exercise).  Do strength-training exercises at least twice a week. Watch Your Levels of Cholesterol and Blood Lipids  Have your blood tested for lipids and cholesterol every 5 years starting at 69 years of age. If you are at high risk for  heart disease, you should start having your blood tested when you are 69 years old. You may need to have your cholesterol levels checked more often if:  Your lipid or cholesterol levels are high.  You are older than 69 years of age.  You are at high risk for heart disease. What should I know about cancer screening? Many types of cancers can be detected early and may often be prevented. Lung Cancer  You should be screened every year for lung cancer if:  You are a current smoker who has smoked for at least 30 years.  You are a former smoker who has quit within the past 15 years.  Talk to your health care provider about your screening options, when you should start screening, and how often you should be screened. Colorectal Cancer  Routine colorectal cancer screening usually begins at 69 years of age and should be repeated every 5-10 years until you are 69 years old. You may need to be screened more often if early forms of precancerous polyps or small growths are found. Your health care provider may recommend screening at an earlier age if you have risk factors for colon cancer.  Your health care provider may recommend using home test kits to check for hidden blood in the stool.  A small camera at the end of a tube can be used to examine your colon (sigmoidoscopy or colonoscopy). This checks for the earliest forms of colorectal cancer. Prostate and  Testicular Cancer  Depending on your age and overall health, your health care provider may do certain tests to screen for prostate and testicular cancer.  Talk to your health care provider about any symptoms or concerns you have about testicular or prostate cancer. Skin Cancer  Check your skin from head to toe regularly.  Tell your health care provider about any new moles or changes in moles, especially if:  There is a change in a mole's size, shape, or color.  You have a mole that is larger than a pencil eraser.  Always use sunscreen.  Apply sunscreen liberally and repeat throughout the day.  Protect yourself by wearing long sleeves, pants, a wide-brimmed hat, and sunglasses when outside. What should I know about heart disease, diabetes, and high blood pressure?  If you are 66-35 years of age, have your blood pressure checked every 3-5 years. If you are 1 years of age or older, have your blood pressure checked every year. You should have your blood pressure measured twice-once when you are at a hospital or clinic, and once when you are not at a hospital or clinic. Record the average of the two measurements. To check your blood pressure when you are not at a hospital or clinic, you can use:  An automated blood pressure machine at a pharmacy.  A home blood pressure monitor.  Talk to your health care provider about your target blood pressure.  If you are between 62-40 years old, ask your health care provider if you should take aspirin to prevent heart disease.  Have regular diabetes screenings by checking your fasting blood sugar level.  If you are at a normal weight and have a low risk for diabetes, have this test once every three years after the age of 21.  If you are overweight and have a high risk for diabetes, consider being tested at a younger age or more often.  A one-time screening for abdominal aortic aneurysm (AAA) by ultrasound is recommended for men aged 27-75 years who are current or former smokers. What should I know about preventing infection? Hepatitis B  If you have a higher risk for hepatitis B, you should be screened for this virus. Talk with your health care provider to find out if you are at risk for hepatitis B infection. Hepatitis C  Blood testing is recommended for:  Everyone born from [birth year] through 1963.  Anyone with known risk factors for hepatitis C. Sexually Transmitted Diseases (STDs)  You should be screened each year for STDs including gonorrhea and chlamydia if:  You are sexually  active and are younger than 69 years of age.  You are older than 69 years of age and your health care provider tells you that you are at risk for this type of infection.  Your sexual activity has changed since you were last screened and you are at an increased risk for chlamydia or gonorrhea. Ask your health care provider if you are at risk.  Talk with your health care provider about whether you are at high risk of being infected with HIV. Your health care provider may recommend a prescription medicine to help prevent HIV infection. What else can I do?  Schedule regular health, dental, and eye exams.  Stay current with your vaccines (immunizations).  Do not use any tobacco products, such as cigarettes, chewing tobacco, and e-cigarettes. If you need help quitting, ask your health care provider.  Limit alcohol intake to no more than 2 drinks per day. One  drink equals 12 ounces of beer, 5 ounces of wine, or 1 ounces of hard liquor.  Do not use street drugs.  Do not share needles.  Ask your health care provider for help if you need support or information about quitting drugs.  Tell your health care provider if you often feel depressed.  Tell your health care provider if you have ever been abused or do not feel safe at home. This information is not intended to replace advice given to you by your health care provider. Make sure you discuss any questions you have with your health care provider. Document Released: 02/26/2008 Document Revised: 04/28/2016 Document Reviewed: 06/03/2015 Elsevier Interactive Patient Education  2017 [employer].

## 2016-11-22 NOTE — Assessment & Plan Note (Signed)
Blood pressure slightly elevated today above goal 140/90 with current medication regimen and no adverse side effects. Denies worst headache of life with no new symptoms of end organ damage noted on physical exam. Encourage monitor blood pressure at home and follow sodium diet. Continue current dosage of amlodipine.

## 2016-11-22 NOTE — Assessment & Plan Note (Signed)
1) Anticipatory Guidance: Discussed importance of wearing a seatbelt while driving and not texting while driving; changing batteries in smoke detector at least once annually; wearing suntan lotion when outside; eating a balanced and moderate diet; getting physical activity at least 30 minutes per day.  2) Immunizations / Screenings / Labs:  Will check on pneumococcal vaccinations. All other immunizations are up-to-date per recommendations. Obtain PSA for prostate cancer screening. All other screenings are up-to-date per recommendations. Obtain CBC, CMET, and lipid profile.    Overall well exam with risk factors for cardiovascular disease including hypertension and hyperlipidemia. Blood pressure slightly elevated today and working on following a low-sodium diet. Encouraged continued physical activity and a diet that is moderate, balance, and varied. Continue other healthy lifestyle behaviors and choices. Follow-up prevention exam in 1 year. Follow-up office visit pending blood work and for chronic conditions.

## 2016-11-22 NOTE — Progress Notes (Signed)
Subjective:    Patient ID: Christopher HammedRoy L Cardamone, male    DOB: 04-03-1946, 71 y.o.   MRN: [id number]  Chief Complaint  Patient presents with  . CPE    not fasting    HPI:  Christopher Mcfarland is a 71 y.o. male who presents today for a Medicare Annual Wellness/Physical exam.    1) Health Maintenance -   Diet - Averaging about 2-3 meals per day consisting of a regular diet working on low sodium; Caffeine intake of about 2-3 cups daily.   Exercise - Gym 3x per week; mixture of cardio and resistance.   2) Preventative Exams / Immunizations:  Dental -- Up to date  Vision -- Up to date   Health Maintenance  Topic Date Due  . PNA vac Low Risk Adult (1 of 2 - PCV13) 11/29/2012  . TETANUS/TDAP  10/22/2019  . COLONOSCOPY  06/12/2024  . INFLUENZA VACCINE  Completed  . Hepatitis C Screening  Completed     Immunization History  Administered Date(s) Administered  . Influenza Whole 08/15/2009  . Influenza,inj,Quad PF,36+ Mos 08/27/2015  . Td 10/21/2009    RISK FACTORS  Tobacco History  Smoking Status  . Never Smoker  Smokeless Tobacco  . Never Used     Cardiac risk factors: advanced age (older than 5555 for men, 6165 for women), dyslipidemia, hypertension and male gender.  Depression Screen  Depression screen [facility] 11/22/2016  Decreased Interest 0  Down, Depressed, Hopeless 0  PHQ - 2 Score 0     Activities of Daily Living In your present state of health, do you have any difficulty performing the following activities?:  Driving? No Managing money?  No Feeding yourself? No Getting from bed to chair? No Climbing a flight of stairs? No Preparing food and eating?: No Bathing or showering? No Getting dressed: No Getting to the toilet? No Using the toilet: No Moving around from place to place: No In the past year have you fallen or had a near fall?:No   Home Safety Has smoke detector and wears seat belts. No excess sun exposure. Are there smokers in your home  (other than you)?  No Do you feel safe at home?  Yes  Hearing Difficulties: No Do you often ask people to speak up or repeat themselves? No Do you experience ringing or noises in your ears? No  Do you have difficulty understanding soft or whispered voices? No    Cognitive Testing  Alert? Yes   Normal Appearance? Yes  Oriented to person? Yes  Place? Yes   Time? Yes  Recall of three objects?  Yes  Can perform simple calculations? Yes  Displays appropriate judgment? Yes  Can read the correct time from a watch face? Yes  Do you feel that you have a problem with memory? No  Do you often misplace items? No   Advanced Directives have been discussed with the patient? Yes   Current Physicians/Providers and Suppliers  1. Marcos EkeGreg Calone, FNP - Internal Medicine 2. Chilton GreathousePraveen Mannam, MD - Pulmonology 3.  Staci Righterobert Comer, MD - Infectious Disease 4. Rollene RotundaJames Hochrein, MD - Cardiology    Indicate any recent Medical Services you may have received from other than [facility] providers in the past year (date may be approximate).  All answers were reviewed with the patient and necessary referrals were made:  Jeanine LuzCalone, Gregory, FNP   11/22/2016    No Known Allergies   Outpatient Medications Prior to Visit  Medication Sig Dispense Refill  .  amLODipine (NORVASC) 5 MG tablet Take 1 tablet (5 mg total) by mouth daily. 90 tablet 0  . simvastatin (ZOCOR) 20 MG tablet Take 1 tablet (20 mg total) by mouth daily. 90 tablet 0   No facility-administered medications prior to visit.      Past Medical History:  Diagnosis Date  . Essential hypertension   . Hepatitis C 2003   Treated  . Hyperlipidemia      Past Surgical History:  Procedure Laterality Date  . CARPAL TUNNEL RELEASE     bilat 1999  . HEMORRHOID SURGERY     1999/4 polyps removed rectum  . TRIGGER FINGER RELEASE     right hand 1999     Family History  Problem Relation Age of Onset  . CAD Brother 66  . Kidney cancer Mother   . Bone cancer  Father   . Colon cancer Neg Hx   . Pancreatic cancer Neg Hx   . Rectal cancer Neg Hx   . Stomach cancer Neg Hx      Social History   Social History  . Marital status: Married    Spouse name: N/A  . Number of children: 4  . Years of education: 12   Occupational History  . [employer]    Social History Main Topics  . Smoking status: Never Smoker  . Smokeless tobacco: Never Used  . Alcohol use 1.2 - 1.8 oz/week    2 - 3 Shots of liquor per week  . Drug use: No     Comment: smoked marijuana "years ago"  . Sexual activity: Not on file   Other Topics Concern  . Not on file   Social History Narrative   Fun: Play pool, yard work, gym, and paint     Review of Systems  Constitutional: Denies fever, chills, fatigue, or significant weight gain/loss. HENT: Head: Denies headache or neck pain Ears: Denies changes in hearing, ringing in ears, earache, drainage Nose: Denies discharge, stuffiness, itching, nosebleed, sinus pain Throat: Denies sore throat, hoarseness, dry mouth, sores, thrush Eyes: Denies loss/changes in vision, pain, redness, blurry/double vision, flashing lights Cardiovascular: Denies chest pain/discomfort, tightness, palpitations, shortness of breath with activity, difficulty lying down, swelling, sudden awakening with shortness of breath Respiratory: Denies shortness of breath, cough, sputum production, wheezing Gastrointestinal: Denies dysphasia, heartburn, change in appetite, nausea, change in bowel habits, rectal bleeding, constipation, diarrhea, yellow skin or eyes Genitourinary: Denies frequency, urgency, burning/pain, blood in urine, incontinence, change in urinary strength. Musculoskeletal: Denies muscle/joint pain, stiffness, back pain, redness or swelling of joints, trauma Skin: Denies rashes, lumps, itching, dryness, color changes, or hair/nail changes Neurological: Denies dizziness, fainting, seizures, weakness, numbness, tingling, tremor Psychiatric -  Denies nervousness, stress, depression or memory loss Endocrine: Denies heat or cold intolerance, sweating, frequent urination, excessive thirst, changes in appetite Hematologic: Denies ease of bruising or bleeding    Objective:     BP (!) 140/96 (BP Location: Left Arm, Patient Position: Sitting, Cuff Size: Normal)   Pulse 94   Temp 97.8 F (36.6 C) (Oral)   Resp 16   Ht 5\' 9"  (1.753 m)   Wt 195 lb (88.5 kg)   SpO2 95%   BMI 28.80 kg/m  Nursing note and vital signs reviewed.  Physical Exam  Constitutional: He is oriented to person, place, and time. He appears well-developed and well-nourished.  HENT:  Head: Normocephalic.  Right Ear: Hearing, tympanic membrane, external ear and ear canal normal.  Left Ear: Hearing, tympanic membrane, external ear and  ear canal normal.  Nose: Nose normal.  Mouth/Throat: Uvula is midline, oropharynx is clear and moist and mucous membranes are normal.  Eyes: Conjunctivae and EOM are normal. Pupils are equal, round, and reactive to light.  Neck: Neck supple. No JVD present. No tracheal deviation present. No thyromegaly present.  Cardiovascular: Normal rate, regular rhythm, normal heart sounds and intact distal pulses.   Pulmonary/Chest: Effort normal and breath sounds normal.  Abdominal: Soft. Bowel sounds are normal. He exhibits no distension and no mass. There is no tenderness. There is no rebound and no guarding.  Musculoskeletal: Normal range of motion. He exhibits no edema or tenderness.  Lymphadenopathy:    He has no cervical adenopathy.  Neurological: He is alert and oriented to person, place, and time. He has normal reflexes. No cranial nerve deficit. He exhibits normal muscle tone. Coordination normal.  Skin: Skin is warm and dry.  Psychiatric: He has a normal mood and affect. His behavior is normal. Judgment and thought content normal.       Assessment & Plan:   During the course of the visit the patient was educated and counseled  about appropriate screening and preventive services including:    Pneumococcal vaccine   Influenza vaccine  Td vaccine  Prostate cancer screening  Colorectal cancer screening  Nutrition counseling   Diet review for nutrition referral? Yes ____  Not Indicated _X___   Patient Instructions (the written plan) was given to the patient.  Medicare Attestation I have personally reviewed: The patient's medical and social history Their use of alcohol, tobacco or illicit drugs Their current medications and supplements The patient's functional ability including ADLs,fall risks, home safety risks, cognitive, and hearing and visual impairment Diet and physical activities Evidence for depression or mood disorders  The patient's weight, height, BMI,  have been recorded in the chart.  I have made referrals, counseling, and provided education to the patient based on review of the above and I have provided the patient with a written personalized care plan for preventive services.     Problem List Items Addressed This Visit      Cardiovascular and Mediastinum   Essential hypertension    Blood pressure slightly elevated today above goal 140/90 with current medication regimen and no adverse side effects. Denies worst headache of life with no new symptoms of end organ damage noted on physical exam. Encourage monitor blood pressure at home and follow sodium diet. Continue current dosage of amlodipine.        Other   Hyperlipidemia    Currently maintained on simvastatin with no adverse side effects. Obtain lipid profile. Continue current dosage of simvastatin pending lipid profile results.      Routine adult health maintenance    1) Anticipatory Guidance: Discussed importance of wearing a seatbelt while driving and not texting while driving; changing batteries in smoke detector at least once annually; wearing suntan lotion when outside; eating a balanced and moderate diet; getting physical  activity at least 30 minutes per day.  2) Immunizations / Screenings / Labs:  Will check on pneumococcal vaccinations. All other immunizations are up-to-date per recommendations. Obtain PSA for prostate cancer screening. All other screenings are up-to-date per recommendations. Obtain CBC, CMET, and lipid profile.    Overall well exam with risk factors for cardiovascular disease including hypertension and hyperlipidemia. Blood pressure slightly elevated today and working on following a low-sodium diet. Encouraged continued physical activity and a diet that is moderate, balance, and varied. Continue other healthy lifestyle  behaviors and choices. Follow-up prevention exam in 1 year. Follow-up office visit pending blood work and for chronic conditions.      Relevant Orders   CBC   Comprehensive metabolic panel   Lipid panel   PSA   Medicare annual wellness visit, initial - Primary    Reviewed and updated patient's medical, surgical, family and social history. Medications and allergies were also reviewed. Basic screenings for depression, activities of daily living, hearing, cognition and safety were performed. Provider list was updated and health plan was provided to the patient.           I am having Mr. Harts maintain his amLODipine and simvastatin.   Follow-up: Return in about 3 months (around 02/22/2017), or if symptoms worsen or fail to improve.   Jeanine Luz, FNP

## 2016-11-23 LAB — PSA: PSA: 0.6 ng/mL (ref 0.10–4.00)

## 2016-11-24 ENCOUNTER — Other Ambulatory Visit: Payer: Self-pay

## 2016-11-24 ENCOUNTER — Telehealth: Payer: Self-pay

## 2016-11-24 DIAGNOSIS — I10 Essential (primary) hypertension: Secondary | ICD-10-CM

## 2016-11-24 MED ORDER — AMLODIPINE BESYLATE 5 MG PO TABS: 5.0000 mg | ORAL_TABLET | Freq: Every day | ORAL | 0 refills | Status: DC

## 2016-11-24 NOTE — Telephone Encounter (Signed)
Pt is interested in taking either lipitor or crestor over the simvastatin. Please advise when they are sent. Thanks

## 2016-11-25 MED ORDER — ROSUVASTATIN CALCIUM 10 MG PO TABS: 10.0000 mg | ORAL_TABLET | Freq: Every day | ORAL | 2 refills | Status: DC

## 2016-11-25 NOTE — Addendum Note (Signed)
Addended by: Jeanine LuzALONE, Shahzaib Azevedo D on: 11/25/2016 03:19 PM   Modules accepted: Orders

## 2016-11-25 NOTE — Telephone Encounter (Signed)
Crestor sent to pharmacy. 

## 2016-11-26 NOTE — Telephone Encounter (Signed)
Pt aware.

## 2017-02-18 ENCOUNTER — Telehealth: Payer: Self-pay | Admitting: Family

## 2017-02-18 NOTE — Telephone Encounter (Signed)
Would like to know if you have received a form for a shoulder brace for the Pt.

## 2017-02-21 NOTE — Telephone Encounter (Signed)
We received form. Called pt to clarify if he asked for or needed a back brace as forms that we get can be scams. Pt advised that he DOES NOT want the back brace and to discard the form. The form has been discarded and will not be completed per pts request.

## 2017-03-01 ENCOUNTER — Other Ambulatory Visit: Payer: Self-pay | Admitting: Family

## 2017-03-01 DIAGNOSIS — I10 Essential (primary) hypertension: Secondary | ICD-10-CM

## 2017-06-10 ENCOUNTER — Encounter (HOSPITAL_BASED_OUTPATIENT_CLINIC_OR_DEPARTMENT_OTHER): Payer: Self-pay | Admitting: Emergency Medicine

## 2017-06-10 ENCOUNTER — Emergency Department (HOSPITAL_BASED_OUTPATIENT_CLINIC_OR_DEPARTMENT_OTHER): Payer: Medicare HMO

## 2017-06-10 ENCOUNTER — Emergency Department (HOSPITAL_BASED_OUTPATIENT_CLINIC_OR_DEPARTMENT_OTHER)
Admission: EM | Admit: 2017-06-10 | Discharge: 2017-06-10 | Disposition: A | Discharge status: Home / Self Care | Payer: Medicare HMO | Attending: Emergency Medicine | Admitting: Emergency Medicine

## 2017-06-10 ENCOUNTER — Encounter (HOSPITAL_COMMUNITY): Payer: Self-pay | Admitting: Emergency Medicine

## 2017-06-10 ENCOUNTER — Emergency Department (HOSPITAL_COMMUNITY)
Admission: EM | Admit: 2017-06-10 | Discharge: 2017-06-10 | Disposition: A | Discharge status: Home / Self Care | Payer: Medicare HMO | Source: Home / Self Care

## 2017-06-10 DIAGNOSIS — R2231 Localized swelling, mass and lump, right upper limb: Secondary | ICD-10-CM | POA: Insufficient documentation

## 2017-06-10 DIAGNOSIS — Z5321 Procedure and treatment not carried out due to patient leaving prior to being seen by health care provider: Secondary | ICD-10-CM

## 2017-06-10 DIAGNOSIS — R600 Localized edema: Secondary | ICD-10-CM | POA: Insufficient documentation

## 2017-06-10 DIAGNOSIS — Z79899 Other long term (current) drug therapy: Secondary | ICD-10-CM | POA: Insufficient documentation

## 2017-06-10 DIAGNOSIS — M25531 Pain in right wrist: Secondary | ICD-10-CM | POA: Diagnosis not present

## 2017-06-10 DIAGNOSIS — I10 Essential (primary) hypertension: Secondary | ICD-10-CM | POA: Insufficient documentation

## 2017-06-10 DIAGNOSIS — M79641 Pain in right hand: Secondary | ICD-10-CM | POA: Diagnosis present

## 2017-06-10 DIAGNOSIS — M7989 Other specified soft tissue disorders: Secondary | ICD-10-CM | POA: Diagnosis not present

## 2017-06-10 DIAGNOSIS — M79671 Pain in right foot: Secondary | ICD-10-CM

## 2017-06-10 MED ORDER — OXYCODONE-ACETAMINOPHEN 5-325 MG PO TABS
1.0000 | ORAL_TABLET | ORAL | Status: DC | PRN
  Administered 2017-06-10: 1 via ORAL
  Filled 2017-06-10: qty 1

## 2017-06-10 MED ORDER — HYDROCODONE-ACETAMINOPHEN 5-325 MG PO TABS
1.0000 | ORAL_TABLET | Freq: Once | ORAL | Status: AC
  Administered 2017-06-10: 1 via ORAL
  Filled 2017-06-10: qty 1

## 2017-06-10 MED ORDER — PREDNISONE 20 MG PO TABS: 40.0000 mg | ORAL_TABLET | Freq: Every day | ORAL | 0 refills | Status: AC

## 2017-06-10 MED ORDER — PREDNISONE 20 MG PO TABS: 40.0000 mg | ORAL_TABLET | Freq: Every day | ORAL | 0 refills | Status: DC

## 2017-06-10 NOTE — Discharge Instructions (Signed)
Your x-ray did not show fracture in her wrist. It did show evidence of what appears to be crystal joint disease.  Please schedule an appointment and follow up with your primary care doctor about this in the next week.  Please take the prednisone I have prescribed to you, 2 tablets once a day for the next 5 days.  As discussed please return to the emergency department if you develop fevers, worsening redness or swelling, redness that extends to your fingers, or any new or worsening symptoms.

## 2017-06-10 NOTE — ED Notes (Signed)
Patient transported to X-ray 

## 2017-06-10 NOTE — ED Notes (Signed)
ED Provider at bedside. 

## 2017-06-10 NOTE — ED Triage Notes (Signed)
Swelling and pain to both hands and R foot. Denies chest pain, SOB, denies injury.

## 2017-06-10 NOTE — ED Provider Notes (Signed)
[facility] Note   CSN: [id number] Arrival date & time: 06/10/17  1624     History   Chief Complaint Chief Complaint  Patient presents with  . Joint Swelling    HPI Christopher Mcfarland is a 71 y.o. male.  HPI   Mr. Christopher Mcfarland is a 71 year old male with a history of cocaine abuse, alcohol abuse, carpal tunnel surgery bilaterally (1999), osteoarthritis of the hands, hypertension, hyperlipidemia who presents to the emergency department for evaluation of right hand pain and swelling which has been going on for the past 2 days. Patient states that he has had bilateral hand pain for several months which is worse at night. He was told that he had osteoarthritis of the hands by his primary care physician. Over the past 2 days he has had worsening swelling in the right wrist with pain. He has been unable to sleep because of the pain. It is described as 10/10 "aching" pain which is worsened with any type of hand movement. He also states that he has some numbness and tingling in all 5 fingers bilaterally which occurs intermittently at night and has not changed. He works as a Education administrator, mows grass, and also pushes carts. He states that today when he went to work he was unable to push the carts because of his wrist pain. He has not taken any medication to alleviate his symptoms. He reports anywhere from 2-4 alcoholic beverages per night. Furthermore he says that last week he had right foot swelling and pain which was alleviated on its own after a few days. He denies history of gout in the past. Requesting an x-ray and pain medicine.   Past Medical History:  Diagnosis Date  . Essential hypertension   . Hepatitis C 2003   Treated  . Hyperlipidemia     Patient Active Problem List   Diagnosis Date Noted  . Routine adult health maintenance 11/22/2016  . Medicare annual wellness visit, initial 11/22/2016  . Osteoarthritis of both hands 09/02/2016  . Essential hypertension  09/02/2016  . Hyperlipidemia 09/02/2016  . Liver mass 01/08/2016  . Liver fibrosis 01/08/2016  . SHOULDER PAIN, LEFT 10/21/2009  . NEUTROPENIA UNSPECIFIED 09/01/2009  . COCAINE ABUSE 09/01/2009  . Chronic hepatitis C without hepatic coma (HCC) 08/15/2009  . TRIGGER FINGER 08/15/2009  . URINALYSIS, ABNORMAL 08/15/2009  . COLONIC POLYPS, HX OF 08/15/2009    Past Surgical History:  Procedure Laterality Date  . CARPAL TUNNEL RELEASE     bilat 1999  . HEMORRHOID SURGERY     1999/4 polyps removed rectum  . TRIGGER FINGER RELEASE     right hand 1999       Home Medications    Prior to Admission medications   Medication Sig Start Date End Date Taking? Authorizing Provider  amLODipine (NORVASC) 5 MG tablet TAKE 1 TABLET(5 MG) BY MOUTH DAILY 03/02/17   Veryl Speak, FNP  predniSONE (DELTASONE) 20 MG tablet Take 2 tablets (40 mg total) by mouth daily. 06/10/17 06/15/17  Kellie Shropshire, PA-C  rosuvastatin (CRESTOR) 10 MG tablet TAKE 1 TABLET(10 MG) BY MOUTH DAILY 03/02/17   Veryl Speak, FNP  simvastatin (ZOCOR) 20 MG tablet Take 1 tablet (20 mg total) by mouth daily. 09/02/16   Veryl Speak, FNP    Family History Family History  Problem Relation Age of Onset  . CAD Brother 47  . Kidney cancer Mother   . Bone cancer Father   . Colon cancer Neg Hx   .  Pancreatic cancer Neg Hx   . Rectal cancer Neg Hx   . Stomach cancer Neg Hx     Social History Social History  Substance Use Topics  . Smoking status: Never Smoker  . Smokeless tobacco: Never Used  . Alcohol use 1.2 - 1.8 oz/week    2 - 3 Shots of liquor per week     Allergies   Patient has no known allergies.   Review of Systems Review of Systems  Constitutional: Negative for chills, fatigue and fever.  Musculoskeletal: Positive for arthralgias (Right hand) and joint swelling. Negative for gait problem.  Skin: Positive for color change (right hand erythema). Negative for rash and wound.  Neurological:  Positive for weakness and numbness.     Physical Exam Updated Vital Signs BP (!) 155/106 (BP Location: Left Arm)   Pulse 92   Temp 98.2 F (36.8 C) (Oral)   Resp 18   Ht  (1.753 m)   Wt 88.5 kg (195 lb)   SpO2 100%   BMI 28.80 kg/m   Physical Exam  Constitutional: He is oriented to person, place, and time. He appears well-developed and well-nourished. No distress.  HENT:  Head: Normocephalic and atraumatic.  Eyes: Right eye exhibits no discharge. Left eye exhibits no discharge.  Pulmonary/Chest: Effort normal. No respiratory distress.  Musculoskeletal: He exhibits no deformity.  Patient with significant erythema and swelling of the right wrist. (See picture below.) Tenderness to palpation over the radial head. Some tenderness to palpation over the 1st and 2nd MCP of the right hand as well. Limited pronation, supination, flexion and extension of the right wrist due to pain. Reduced flexion and extension of the MCP joints of the right hand due to pain. Normal ROM of PIP and DIP joints in right hand. Grip strength 4/5 in right hand, left hand 5/5.   Neurological: He is alert and oriented to person, place, and time. Coordination normal.  Distal sensation to soft touch intact in bilateral upper and lower extremities.  Skin: Skin is warm and dry. He is not diaphoretic.  No wound or rash noted.  Psychiatric: He has a normal mood and affect. His behavior is normal.  Nursing note and vitals reviewed.    ED Treatments / Results  Labs (all labs ordered are listed, but only abnormal results are displayed) Labs Reviewed - No data to display  EKG  EKG Interpretation None       Radiology Dg Hand Complete Right  Result Date: 06/10/2017 CLINICAL DATA:  Chronic tingling in both hands for months, redness beginning yesterday. EXAM: RIGHT HAND - COMPLETE 3+ VIEW COMPARISON:  None. FINDINGS: There is no evidence of fracture or dislocation. There is no evidence of arthropathy or other  focal bone abnormality. Small calcification projecting in palmar wrist. Faint wrist intra-articular calcifications compatible with CPPD. Mild wrist soft tissue swelling without subcutaneous gas or radiopaque foreign bodies. IMPRESSION: Wrist soft tissue swelling. Suspected CPPD. No acute osseous process. Electronically Signed   By: Awilda Metro M.D.   On: 06/10/2017 18:05    Procedures Procedures (including critical care time)  Medications Ordered in ED Medications  HYDROcodone-acetaminophen (NORCO/VICODIN) 5-325 MG per tablet 1 tablet (1 tablet Oral Given 06/10/17 1802)     Initial Impression / Assessment and Plan / ED Course  I have reviewed the triage vital signs and the nursing notes.  Pertinent labs & imaging results that were available during my care of the patient were reviewed by me and considered in  my medical decision making (see chart for details).     Hand x-ray reveals signs of CPPD. Patient's exam is consistent with crystalopathy. He also has a history of similar symptoms in his right foot (swelling, pain, erythema, warmth) which self resolved last week.  Patient is nontoxic-appearing, no fevers. He is not diabetic, not taking any immunosuppressants. Do not suspect septic joint at this time. Will treat with steroid burst. Have given patient strict return precautions for septic joint, patient voices understanding and agrees at bedside. Dr. Clarene Duke also saw this patient and agrees with the plan.   Final Clinical Impressions(s) / ED Diagnoses   Final diagnoses:  Right wrist pain    New Prescriptions Current Discharge Medication List    START taking these medications   Details  predniSONE (DELTASONE) 20 MG tablet Take 2 tablets (40 mg total) by mouth daily. Qty: 10 tablet, Refills: 0         Lawrence Marseilles 06/10/17 1956    Little, Ambrose Finland, MD 06/11/17 [phone]

## 2017-06-10 NOTE — ED Triage Notes (Signed)
Pt presents with bilateral hand swelling and pain also with right foot pain. States the tingling has been going on for a month but the swelling began yesterday after work. Vitals stable, skin intact, afebrile at triage.

## 2017-06-10 NOTE — ED Notes (Signed)
Pt pacing around in the ER waiting room. States his pain is unbearable. Standing orders entered.

## 2017-06-10 NOTE — ED Notes (Signed)
Pt instructed to let triage know of any changes. He states his wife will be here shortly.

## 2017-08-12 ENCOUNTER — Emergency Department (HOSPITAL_BASED_OUTPATIENT_CLINIC_OR_DEPARTMENT_OTHER)
Admission: EM | Admit: 2017-08-12 | Discharge: 2017-08-12 | Disposition: A | Discharge status: Home / Self Care | Payer: Medicare HMO | Attending: Emergency Medicine | Admitting: Emergency Medicine

## 2017-08-12 ENCOUNTER — Other Ambulatory Visit: Payer: Self-pay

## 2017-08-12 ENCOUNTER — Encounter (HOSPITAL_BASED_OUTPATIENT_CLINIC_OR_DEPARTMENT_OTHER): Payer: Self-pay | Admitting: *Deleted

## 2017-08-12 DIAGNOSIS — M436 Torticollis: Secondary | ICD-10-CM | POA: Diagnosis not present

## 2017-08-12 DIAGNOSIS — Z79899 Other long term (current) drug therapy: Secondary | ICD-10-CM | POA: Insufficient documentation

## 2017-08-12 DIAGNOSIS — M542 Cervicalgia: Secondary | ICD-10-CM | POA: Diagnosis present

## 2017-08-12 DIAGNOSIS — I10 Essential (primary) hypertension: Secondary | ICD-10-CM | POA: Diagnosis not present

## 2017-08-12 MED ORDER — METHOCARBAMOL 750 MG PO TABS: 750.0000 mg | ORAL_TABLET | Freq: Four times a day (QID) | ORAL | 0 refills | Status: DC

## 2017-08-12 MED ORDER — HYDROCODONE-ACETAMINOPHEN 5-325 MG PO TABS: 1.0000 | ORAL_TABLET | ORAL | 0 refills | Status: DC | PRN

## 2017-08-12 NOTE — ED Provider Notes (Signed)
[facility] EMERGENCY DEPARTMENT Provider Note   CSN: [id number] Arrival date & time: 08/12/17  1542     History   Chief Complaint Chief Complaint  Patient presents with  . Neck Pain    HPI Christopher Mcfarland is a 71 y.o. male.  HPI 71 year old male who presents with right-sided neck pain.  Symptoms have been ongoing for 1 week.  No preceding injury, fall, heavy lifting or exertional activity.  States that he woke up one morning with mild symptoms that have been persistent and now worsening. Worse with movement of head. Symptoms not improved with a muscle relaxant that he took from his wife's prescriptions and ibuprofen.  Denies vision or speech changes, headaches, confusion, difficulty walking, focal numbness or weakness. No fever chills or URI symptoms.   Past Medical History:  Diagnosis Date  . Essential hypertension   . Hepatitis C 2003   Treated  . Hyperlipidemia     Patient Active Problem List   Diagnosis Date Noted  . Routine adult health maintenance 11/22/2016  . Medicare annual wellness visit, initial 11/22/2016  . Osteoarthritis of both hands 09/02/2016  . Essential hypertension 09/02/2016  . Hyperlipidemia 09/02/2016  . Liver mass 01/08/2016  . Liver fibrosis 01/08/2016  . SHOULDER PAIN, LEFT 10/21/2009  . NEUTROPENIA UNSPECIFIED 09/01/2009  . COCAINE ABUSE 09/01/2009  . Chronic hepatitis C without hepatic coma (HCC) 08/15/2009  . TRIGGER FINGER 08/15/2009  . URINALYSIS, ABNORMAL 08/15/2009  . COLONIC POLYPS, HX OF 08/15/2009    Past Surgical History:  Procedure Laterality Date  . CARPAL TUNNEL RELEASE     bilat 1999  . HEMORRHOID SURGERY     1999/4 polyps removed rectum  . TRIGGER FINGER RELEASE     right hand 1999       Home Medications    Prior to Admission medications   Medication Sig Start Date End Date Taking? Authorizing Provider  amLODipine (NORVASC) 5 MG tablet TAKE 1 TABLET(5 MG) BY MOUTH DAILY 03/02/17   Veryl Speakalone, Gregory  D, FNP  rosuvastatin (CRESTOR) 10 MG tablet TAKE 1 TABLET(10 MG) BY MOUTH DAILY 03/02/17   Veryl Speakalone, Gregory D, FNP  simvastatin (ZOCOR) 20 MG tablet Take 1 tablet (20 mg total) by mouth daily. 09/02/16   Veryl Speakalone, Gregory D, FNP    Family History Family History  Problem Relation Age of Onset  . CAD Brother 5665  . Kidney cancer Mother   . Bone cancer Father   . Colon cancer Neg Hx   . Pancreatic cancer Neg Hx   . Rectal cancer Neg Hx   . Stomach cancer Neg Hx     Social History Social History   Tobacco Use  . Smoking status: Never Smoker  . Smokeless tobacco: Never Used  Substance Use Topics  . Alcohol use: Yes    Alcohol/week: 1.2 - 1.8 oz    Types: 2 - 3 Shots of liquor per week  . Drug use: No    Comment: smoked marijuana "years ago"     Allergies   Patient has no known allergies.   Review of Systems Review of Systems  Constitutional: Negative for fever.  Respiratory: Negative for shortness of breath.   Cardiovascular: Negative for chest pain.  Musculoskeletal: Positive for neck pain.  Neurological: Negative for syncope, speech difficulty, weakness and numbness.  Hematological: Does not bruise/bleed easily.  All other systems reviewed and are negative.    Physical Exam Updated Vital Signs BP 128/84   Pulse (!) 113  Temp 98 F (36.7 C) (Oral)   Resp 18   Ht 5\' 9"  (1.753 m)   Wt 83.9 kg (185 lb)   SpO2 98%   BMI 27.32 kg/m   Physical Exam Physical Exam  Nursing note and vitals reviewed. Constitutional: Well developed, well nourished, non-toxic, and in no acute distress Head: Normocephalic and atraumatic.  Mouth/Throat: Oropharynx is clear and moist.  Neck: Normal range of motion. Neck supple. tenderness of right paraspinal muscles and right trapezius muscle Cardiovascular: Normal rate and regular rhythm.  No bruits. Pulmonary/Chest: Effort normal and breath sounds normal.  Abdominal: Soft. There is no tenderness. There is no rebound and no guarding.    Musculoskeletal: Normal range of motion.  Skin: Skin is warm and dry.  Psychiatric: Cooperative Neurological:  Alert, oriented to person, place, time, and situation. Memory grossly in tact. Fluent speech. No dysarthria or aphasia.  Cranial nerves: VF are full. Pupils are symmetric, and reactive to light. EOMI without nystagmus. No gaze deviation. Facial muscles symmetric with activation. Sensation to light touch over face in tact bilaterally. Hearing grossly in tact. Palate elevates symmetrically. Head turn and shoulder shrug are intact. Tongue midline.  Reflexes defered.  Muscle bulk and tone normal. No pronator drift. Moves all extremities symmetrically. Sensation to light touch is in tact throughout in bilateral upper and lower extremities. Coordination reveals no dysmetria with finger to nose. Gait is narrow-based and steady. Non-ataxic.   ED Treatments / Results  Labs (all labs ordered are listed, but only abnormal results are displayed) Labs Reviewed - No data to display  EKG  EKG Interpretation None       Radiology No results found.  Procedures Procedures (including critical care time)  Medications Ordered in ED Medications - No data to display   Initial Impression / Assessment and Plan / ED Course  I have reviewed the triage vital signs and the nursing notes.  Pertinent labs & imaging results that were available during my care of the patient were reviewed by me and considered in my medical decision making (see chart for details).     Presents with neck pain.  Tenderness over the right trapezius and right paraspinal muscles.  Consistent with likely muscle spasm/pain.  Presentation at this time not concerning for spinal cord compression, carotid or vertebral artery dissection or other serious causes. No radicular symptoms. No midline tenderness. Normal neurological exam. Patient to continue supportive care management. Will discharge with prescriptions for analgesics  and muscle relaxants.  Final Clinical Impressions(s) / ED Diagnoses   Final diagnoses:  Torticollis    ED Discharge Orders    None       [name], Moris Ratchford Duo, MD 08/12/17 1739

## 2017-08-12 NOTE — ED Notes (Signed)
Patient is A & O x4.  He understood AVS instructions.  

## 2017-08-12 NOTE — Discharge Instructions (Signed)
You have muscle pain. Take medications as prescribed. Continue heat pads and ibuprofen.   Follow-up with your PCP for ongoing management. Return for worsening symptoms, including escalating pain, numbness or weakness of hands or arms, confusion, or any other symptoms concerning to you.

## 2017-08-12 NOTE — ED Triage Notes (Signed)
Stiffness in the right side of his neck x 1 week. No injury. Worse when he tries to move his head side to side.

## 2017-09-09 ENCOUNTER — Other Ambulatory Visit: Payer: Self-pay | Admitting: *Deleted

## 2017-09-09 ENCOUNTER — Other Ambulatory Visit: Payer: Self-pay | Admitting: Internal Medicine

## 2017-09-09 MED ORDER — ROSUVASTATIN CALCIUM 10 MG PO TABS: ORAL_TABLET | ORAL | 0 refills | Status: DC

## 2017-09-19 ENCOUNTER — Other Ambulatory Visit: Payer: Self-pay | Admitting: Internal Medicine

## 2017-09-19 ENCOUNTER — Other Ambulatory Visit: Payer: Self-pay | Admitting: *Deleted

## 2017-09-19 DIAGNOSIS — I10 Essential (primary) hypertension: Secondary | ICD-10-CM

## 2017-09-19 MED ORDER — AMLODIPINE BESYLATE 5 MG PO TABS: ORAL_TABLET | ORAL | 0 refills | Status: DC

## 2017-09-29 DIAGNOSIS — H524 Presbyopia: Secondary | ICD-10-CM | POA: Diagnosis not present

## 2017-10-07 DIAGNOSIS — Z0100 Encounter for examination of eyes and vision without abnormal findings: Secondary | ICD-10-CM | POA: Diagnosis not present

## 2017-10-10 ENCOUNTER — Telehealth: Payer: Self-pay | Admitting: Family

## 2017-10-10 DIAGNOSIS — I10 Essential (primary) hypertension: Secondary | ICD-10-CM

## 2017-10-10 MED ORDER — AMLODIPINE BESYLATE 5 MG PO TABS: ORAL_TABLET | ORAL | 0 refills | Status: DC

## 2017-10-10 NOTE — Telephone Encounter (Signed)
Pt is scheduled to see Christopher Mcfarland on 12/12/2017 for a physical. He is needing a refill on amLODipine (NORVASC) 5 MG tablet. Can this be sent in to get him through?

## 2017-10-10 NOTE — Telephone Encounter (Signed)
Per office policy sent enough mediation to local pharmacy until appt...Raechel Chute/lmb

## 2017-10-13 ENCOUNTER — Encounter: Payer: Self-pay | Admitting: Gastroenterology

## 2017-10-13 ENCOUNTER — Ambulatory Visit (INDEPENDENT_AMBULATORY_CARE_PROVIDER_SITE_OTHER): Payer: Medicare HMO | Admitting: Gastroenterology

## 2017-10-13 VITALS — BP 110/66 | HR 100 | Ht 68.0 in | Wt 190.2 lb

## 2017-10-13 DIAGNOSIS — R6889 Other general symptoms and signs: Secondary | ICD-10-CM | POA: Diagnosis not present

## 2017-10-13 DIAGNOSIS — R0989 Other specified symptoms and signs involving the circulatory and respiratory systems: Secondary | ICD-10-CM

## 2017-10-13 NOTE — Patient Instructions (Signed)
[facility]

## 2017-10-13 NOTE — Progress Notes (Signed)
[facility] Consult Note:  History: Christopher Mcfarland 10/13/2017  Referring physician: Veryl Speak, FNP  Reason for consult/chief complaint: Gastroesophageal Reflux (constant clearing of the throat)   Subjective  HPI:  This is a 72 year old man self-referred and accompanied by his wife for concern of possible GERD.  His wife urged him to come for an appointment today because for the last 20 years he has had throat clearing and right-sided nasal and sinus congestion with postnasal drip.  His wife says he clears his throat but does not produce any mucus, and does so both day and night, even in his sleep.  They wanted him to be evaluated for GERD because they report their granddaughter had identical symptoms and went through an extensive workup with ENT and allergy and ultimately was diagnosed with GERD.  Treatment of that apparently resolved her symptoms.  I reviewed the last primary care notes, it does not seem that he has brought this up with them nor had any particular therapy for it.  It sounds like he may have occasionally tried various sprays, unknown if he tried any allergy medicines or antacids. He denies heartburn regurgitation nausea vomiting early satiety weight change altered bowel habits or rectal bleeding.  ROS:  Review of Systems  He denies chest pain dyspnea or dysuria Past Medical History: Past Medical History:  Diagnosis Date  . Essential hypertension   . Hepatitis C 2003   Treated  . Hyperlipidemia      Past Surgical History: Past Surgical History:  Procedure Laterality Date  . CARPAL TUNNEL RELEASE     bilat 1999  . HEMORRHOID SURGERY     1999/4 polyps removed rectum  . TRIGGER FINGER RELEASE     right hand 1999     Family History: Family History  Problem Relation Age of Onset  . CAD Brother 75  . Kidney cancer Mother   . Bone cancer Father   . Colon cancer Neg Hx   . Pancreatic cancer Neg Hx   . Rectal cancer Neg Hx     . Stomach cancer Neg Hx     Social History: Social History   Socioeconomic History  . Marital status: Married    Spouse name: None  . Number of children: 4  . Years of education: 79  . Highest education level: None  Social Needs  . Financial resource strain: None  . Food insecurity - worry: None  . Food insecurity - inability: None  . Transportation needs - medical: None  . Transportation needs - non-medical: None  Occupational History  . Occupation: Painter  Tobacco Use  . Smoking status: Never Smoker  . Smokeless tobacco: Never Used  Substance and Sexual Activity  . Alcohol use: Yes    Alcohol/week: 1.2 - 1.8 oz    Types: 2 - 3 Shots of liquor per week  . Drug use: No    Comment: smoked marijuana "years ago"  . Sexual activity: None  Other Topics Concern  . None  Social History Narrative   Fun: Play pool, yard work, gym, and paint    Allergies: No Known Allergies  Outpatient Meds: Current Outpatient Medications  Medication Sig Dispense Refill  . amLODipine (NORVASC) 5 MG tablet TAKE 1 TABLET(5 MG) BY MOUTH DAILY. Must keep appt w/new provider for refills 90 tablet 0  . rosuvastatin (CRESTOR) 10 MG tablet TAKE 1 TABLET BY MOUTH EVERY DAY. NEED TO SCHEDULE APPT. FOR FURTHER REFILLS 90 tablet 0   No  current facility-administered medications for this visit.       ___________________________________________________________________ Objective   Exam:  BP 110/66 (BP Location: Left Arm, Patient Position: Sitting, Cuff Size: Normal)   Pulse 100   Ht 5\' 8"  (1.727 m) Comment: height measured without shoes  Wt 190 lb 4 oz (86.3 kg)   BMI 28.93 kg/m    General: this is a(n) well-appearing man, somewhat gravelly vocal quality  Eyes: sclera anicteric, no redness  ENT: oral mucosa moist without lesions, no cervical or supraclavicular lymphadenopathy, good dentition  CV: RRR without murmur, S1/S2, no JVD, no peripheral edema  Resp: clear to auscultation  bilaterally, normal RR and effort noted  GI: soft, no tenderness, with active bowel sounds. No guarding or palpable organomegaly noted.  Skin; warm and dry, no rash or jaundice noted  Neuro: awake, alert and oriented x 3. Normal gross motor function and fluent speech   Assessment: Encounter Diagnosis  Name Primary?  . Throat clearing Yes    This patient's symptoms seem to be chronic allergic rhinitis or sinus condition causing postnasal drip and throat clearing.  I really do not think this is reflux related.  I believe the yield of an upper endoscopy would be very low in this case.  They are intent on trying something, so I suggested he could take once daily omeprazole or equivalent medicine for 2 or 3 weeks and see if there is any improvement.  I suggested an ENT evaluation, which they were amenable to.  His wife expressed some frustration that this is not the answer to his problem, since "it took me 20 years to have him do this".  Plan:  Short trial PPI ENT referral was placed I will send a copy this to his primary care provider.  Christopher Mcfarland  CC: Veryl Speakalone, Gregory D, FNP

## 2017-10-14 ENCOUNTER — Telehealth: Payer: Self-pay

## 2017-10-14 NOTE — Telephone Encounter (Signed)
[facility] Will await appointment information.

## 2017-10-25 DIAGNOSIS — R6889 Other general symptoms and signs: Secondary | ICD-10-CM | POA: Diagnosis not present

## 2017-10-25 DIAGNOSIS — K219 Gastro-esophageal reflux disease without esophagitis: Secondary | ICD-10-CM | POA: Diagnosis not present

## 2017-10-25 DIAGNOSIS — J3489 Other specified disorders of nose and nasal sinuses: Secondary | ICD-10-CM | POA: Diagnosis not present

## 2017-10-26 NOTE — Telephone Encounter (Signed)
Contacted Mr. Christopher Mcfarland, to check on his referral status. He states he was seen by ENT on 10-25-2017. Will look out for his office note.

## 2017-11-29 ENCOUNTER — Other Ambulatory Visit: Payer: Self-pay | Admitting: Internal Medicine

## 2017-12-12 ENCOUNTER — Ambulatory Visit (INDEPENDENT_AMBULATORY_CARE_PROVIDER_SITE_OTHER): Payer: Medicare HMO | Admitting: Nurse Practitioner

## 2017-12-12 ENCOUNTER — Encounter: Payer: Self-pay | Admitting: Nurse Practitioner

## 2017-12-12 ENCOUNTER — Other Ambulatory Visit (INDEPENDENT_AMBULATORY_CARE_PROVIDER_SITE_OTHER): Payer: Medicare HMO

## 2017-12-12 VITALS — BP 122/78 | HR 91 | Temp 98.3°F | Resp 16 | Ht 68.0 in | Wt 194.6 lb

## 2017-12-12 DIAGNOSIS — E782 Mixed hyperlipidemia: Secondary | ICD-10-CM

## 2017-12-12 DIAGNOSIS — Z0000 Encounter for general adult medical examination without abnormal findings: Secondary | ICD-10-CM

## 2017-12-12 DIAGNOSIS — Z23 Encounter for immunization: Secondary | ICD-10-CM

## 2017-12-12 DIAGNOSIS — Z125 Encounter for screening for malignant neoplasm of prostate: Secondary | ICD-10-CM | POA: Diagnosis not present

## 2017-12-12 DIAGNOSIS — I10 Essential (primary) hypertension: Secondary | ICD-10-CM

## 2017-12-12 LAB — COMPREHENSIVE METABOLIC PANEL
ALK PHOS: 59 U/L (ref 39–117)
ALT: 21 U/L (ref 0–53)
AST: 22 U/L (ref 0–37)
Albumin: 4.4 g/dL (ref 3.5–5.2)
BILIRUBIN TOTAL: 0.6 mg/dL (ref 0.2–1.2)
BUN: 17 mg/dL (ref 6–23)
CO2: 30 meq/L (ref 19–32)
Calcium: 9.5 mg/dL (ref 8.4–10.5)
Chloride: 104 mEq/L (ref 96–112)
Creatinine, Ser: 1.02 mg/dL (ref 0.40–1.50)
GFR: 92.85 mL/min (ref >60.00)
Glucose, Bld: 102 mg/dL — ABNORMAL HIGH (ref 70–99)
Potassium: 4 mEq/L (ref 3.5–5.1)
Sodium: 140 mEq/L (ref 135–145)
Total Protein: 7.8 g/dL (ref 6.0–8.3)

## 2017-12-12 LAB — CBC
HCT: 38.1 % — ABNORMAL LOW (ref 39.0–52.0)
HEMOGLOBIN: 12.7 g/dL — AB (ref 13.0–17.0)
MCHC: 33.4 g/dL (ref 30.0–36.0)
MCV: 86.2 fl (ref 78.0–100.0)
Platelets: 177 10*3/uL (ref 150.0–400.0)
RBC: 4.41 Mil/uL (ref 4.22–5.81)
RDW: 14.4 % (ref 11.5–15.5)
WBC: 5.1 10*3/uL (ref 4.0–10.5)

## 2017-12-12 LAB — LIPID PANEL
CHOL/HDL RATIO: 4
Cholesterol: 155 mg/dL (ref 0–200)
HDL: 41.8 mg/dL (ref >39.00)
LDL Cholesterol: 95 mg/dL (ref 0–99)
NonHDL: 112.98
TRIGLYCERIDES: 91 mg/dL (ref 0.0–149.0)
VLDL: 18.2 mg/dL (ref 0.0–40.0)

## 2017-12-12 LAB — PSA: PSA: 0.77 ng/mL (ref 0.10–4.00)

## 2017-12-12 MED ORDER — AMLODIPINE BESYLATE 5 MG PO TABS: ORAL_TABLET | ORAL | 2 refills | Status: DC

## 2017-12-12 MED ORDER — ROSUVASTATIN CALCIUM 10 MG PO TABS: ORAL_TABLET | ORAL | 2 refills | Status: DC

## 2017-12-12 NOTE — Patient Instructions (Addendum)
Please head downstairs for lab work/x-rays. If any of your test results are critically abnormal, you will be contacted right away. Your results may be released to your MyChart for viewing before I am able to provide you with my response. I will contact you within a week about your test results and any recommendations for abnormalities.  Please return in 1 year for your annual physical, or sooner if you need me!   Preventive Care 11 Years and Older, Male Preventive care refers to lifestyle choices and visits with your health care provider that can promote health and wellness. What does preventive care include?  A yearly physical exam. This is also called an annual well check.  Dental exams once or twice a year.  Routine eye exams. Ask your health care provider how often you should have your eyes checked.  Personal lifestyle choices, including: ? Daily care of your teeth and gums. ? Regular physical activity. ? Eating a healthy diet. ? Avoiding tobacco and drug use. ? Limiting alcohol use. ? Practicing safe sex. ? Taking low doses of aspirin every day. ? Taking vitamin and mineral supplements as recommended by your health care provider. What happens during an annual well check? The services and screenings done by your health care provider during your annual well check will depend on your age, overall health, lifestyle risk factors, and family history of disease. Counseling Your health care provider may ask you questions about your:  Alcohol use.  Tobacco use.  Drug use.  Emotional well-being.  Home and relationship well-being.  Sexual activity.  Eating habits.  History of falls.  Memory and ability to understand (cognition).  Work and work Statistician.  Screening You may have the following tests or measurements:  Height, weight, and BMI.  Blood pressure.  Lipid and cholesterol levels. These may be checked every 5 years, or more frequently if you are over 70  years old.  Skin check.  Lung cancer screening. You may have this screening every year starting at age 70 if you have a 30-pack-year history of smoking and currently smoke or have quit within the past 15 years.  Fecal occult blood test (FOBT) of the stool. You may have this test every year starting at age 70.  Flexible sigmoidoscopy or colonoscopy. You may have a sigmoidoscopy every 5 years or a colonoscopy every 10 years starting at age 70.  Prostate cancer screening. Recommendations will vary depending on your family history and other risks.  Hepatitis C blood test.  Hepatitis B blood test.  Sexually transmitted disease (STD) testing.  Diabetes screening. This is done by checking your blood sugar (glucose) after you have not eaten for a while (fasting). You may have this done every 1-3 years.  Abdominal aortic aneurysm (AAA) screening. You may need this if you are a current or former smoker.  Osteoporosis. You may be screened starting at age 46 if you are at high risk.  Talk with your health care provider about your test results, treatment options, and if necessary, the need for more tests. Vaccines Your health care provider may recommend certain vaccines, such as:  Influenza vaccine. This is recommended every year.  Tetanus, diphtheria, and acellular pertussis (Tdap, Td) vaccine. You may need a Td booster every 10 years.  Varicella vaccine. You may need this if you have not been vaccinated.  Zoster vaccine. You may need this after age 70.  Measles, mumps, and rubella (MMR) vaccine. You may need at least one dose of MMR  if you were born in 1957 or later. You may also need a second dose.  Pneumococcal 13-valent conjugate (PCV13) vaccine. One dose is recommended after age 21.  Pneumococcal polysaccharide (PPSV23) vaccine. One dose is recommended after age 70.  Meningococcal vaccine. You may need this if you have certain conditions.  Hepatitis A vaccine. You may need this  if you have certain conditions or if you travel or work in places where you may be exposed to hepatitis A.  Hepatitis B vaccine. You may need this if you have certain conditions or if you travel or work in places where you may be exposed to hepatitis B.  Haemophilus influenzae type b (Hib) vaccine. You may need this if you have certain risk factors.  Talk to your health care provider about which screenings and vaccines you need and how often you need them. This information is not intended to replace advice given to you by your health care provider. Make sure you discuss any questions you have with your health care provider. Document Released: 09/26/2015 Document Revised: 05/19/2016 Document Reviewed: 07/01/2015 Elsevier Interactive Patient Education  [employer].

## 2017-12-12 NOTE — Progress Notes (Signed)
Name: Christopher Mcfarland   MRN: [id number]    DOB: 09-04-1946   Date:12/12/2017       Progress Note  Subjective  Chief Complaint  Chief Complaint  Patient presents with  . Establish Care    CPE, not fasting    HPI [name] is establishing care with me today. Patient presents for annual CPE. 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  USPSTF grade A and B recommendations:  Diet: tries to follow a low sodium diet Exercise: gym a few days a week, has not been going as much recently   Depression: No concerns Depression screen [facility] 2/9 12/12/2017 11/22/2016 08/27/2015  Decreased Interest 0 0 0  Down, Depressed, Hopeless 0 0 0  PHQ - 2 Score 0 0 0   Hypertension: maintained on amlodipine, takes medication daily, denies any noted adverse effects, recent home readings 130s/80s BP Readings from Last 3 Encounters:  12/12/17 122/78  10/13/17 110/66  08/12/17 (!) 149/86   Obesity: Wt Readings from Last 3 Encounters:  12/12/17 194 lb 9.6 oz (88.3 kg)  10/13/17 190 lb 4 oz (86.3 kg)  08/12/17 185 lb (83.9 kg)   BMI Readings from Last 3 Encounters:  12/12/17 29.59 kg/m  10/13/17 28.93 kg/m  08/12/17 27.32 kg/m   Lipids: maintained on crestor, reports daily medication compliance with no noted adverse side effects, no history of MI or stroke Lab Results  Component Value Date   CHOL 174 11/22/2016   CHOL 203 (H) 10/21/2009   Lab Results  Component Value Date   HDL 38.20 (L) 11/22/2016   HDL 54 10/21/2009   Lab Results  Component Value Date   LDLCALC 108 (H) 11/22/2016   LDLCALC 128 (H) 10/21/2009   Lab Results  Component Value Date   TRIG 139.0 11/22/2016   TRIG 104 10/21/2009   Lab Results  Component Value Date   CHOLHDL 5 11/22/2016   CHOLHDL 3.8 Ratio 10/21/2009   No results found for: LDLDIRECT Glucose:  Glucose, Bld  Date Value Ref Range Status  11/22/2016 97 70 - 99 mg/dL Final   11/91/4782 90 65 - 99 mg/dL Final  95/62/1308 97 70 - 99 mg/dL Final   Alcohol: about 2 drinks 2-3 days a week Tobacco use: No, never  STD testing and prevention (chl/gon/syphilis): declines HIV: declines screening  Skin cancer: no concerns, does not wear sunscreen Colorectal cancer: colonoscopy up date   Prostate cancer: PSA ordered today Lab Results  Component Value Date   PSA 0.60 11/22/2016   PSA 0.47 08/15/2009   IPSS Questionnaire (AUA-7): Over the past month.   1)  How often have you had a sensation of not emptying your bladder completely after you finish urinating?  0 - Not at all  2)  How often have you had to urinate again less than two hours after you finished urinating? 0 - Not at all  3)  How often have you found you stopped and started again several times when you urinated?  0 - Not at all  4) How difficult have you found it to postpone urination?  0 - Not at all  5) How often have you had a weak urinary stream?  0 - Not at all  6) How often have you had to push or strain to begin urination?  0 - Not at all  7) How many times did you most typically get up to urinate  from the time you went to bed until the time you got up in the morning?  0 - None  Total score:  0   Aspirin: not indicated  ECG:  Not indicated  Vaccinations: PNA today  Advanced Care Planning: A voluntary discussion about advance care planning including the explanation and discussion of advance directives.  Discussed health care proxy and Living will, and the patient DOES NOT have a living will at present time. If patient does have living will, I have requested they bring this to the clinic to be scanned in to their chart.  Patient Active Problem List   Diagnosis Date Noted  . Routine adult health maintenance 11/22/2016  . Medicare annual wellness visit, initial 11/22/2016  . Osteoarthritis of both hands 09/02/2016  . Essential hypertension 09/02/2016  . Hyperlipidemia 09/02/2016  . Liver mass  01/08/2016  . Liver fibrosis 01/08/2016  . SHOULDER PAIN, LEFT 10/21/2009  . NEUTROPENIA UNSPECIFIED 09/01/2009  . COCAINE ABUSE 09/01/2009  . Chronic hepatitis C without hepatic coma (HCC) 08/15/2009  . TRIGGER FINGER 08/15/2009  . URINALYSIS, ABNORMAL 08/15/2009  . COLONIC POLYPS, HX OF 08/15/2009    Past Surgical History:  Procedure Laterality Date  . CARPAL TUNNEL RELEASE     bilat 1999  . HEMORRHOID SURGERY     1999/4 polyps removed rectum  . TRIGGER FINGER RELEASE     right hand 1999    Family History  Problem Relation Age of Onset  . CAD Brother 78  . Kidney cancer Mother   . Bone cancer Father   . Colon cancer Neg Hx   . Pancreatic cancer Neg Hx   . Rectal cancer Neg Hx   . Stomach cancer Neg Hx     Social History   Socioeconomic History  . Marital status: Married    Spouse name: Not on file  . Number of children: 4  . Years of education: 45  . Highest education level: Not on file  Occupational History  . Occupation: [employer]  . Financial resource strain: Not on file  . Food insecurity:    Worry: Not on file    Inability: Not on file  . Transportation needs:    Medical: Not on file    Non-medical: Not on file  Tobacco Use  . Smoking status: Never Smoker  . Smokeless tobacco: Never Used  Substance and Sexual Activity  . Alcohol use: Yes    Alcohol/week: 1.2 - 1.8 oz    Types: 2 - 3 Shots of liquor per week  . Drug use: No    Comment: smoked marijuana "years ago"  . Sexual activity: Not on file  Lifestyle  . Physical activity:    Days per week: Not on file    Minutes per session: Not on file  . Stress: Not on file  Relationships  . Social connections:    Talks on phone: Not on file    Gets together: Not on file    Attends religious service: Not on file    Active member of club or organization: Not on file    Attends meetings of clubs or organizations: Not on file    Relationship status: Not on file  . Intimate partner  violence:    Fear of current or ex partner: Not on file    Emotionally abused: Not on file    Physically abused: Not on file    Forced sexual activity: Not on file  Other Topics Concern  .  Not on file  Social History Narrative   Fun: Play pool, yard work, gym, and [employer]     Current Outpatient Medications:  .  amLODipine (NORVASC) 5 MG tablet, TAKE 1 TABLET(5 MG) BY MOUTH DAILY. Must keep appt w/new provider for refills, Disp: 90 tablet, Rfl: 0 .  rosuvastatin (CRESTOR) 10 MG tablet, TAKE 1 TABLET BY MOUTH EVERY DAY. NEED TO SCHEDULE APPT. FOR FURTHER REFILLS, Disp: 30 tablet, Rfl: 0  No Known Allergies   ROS  Constitutional: Negative for fever or weight change.  Respiratory: Negative for cough and shortness of breath.   Cardiovascular: Negative for chest pain or palpitations.  Gastrointestinal: Negative for abdominal pain, no bowel changes.  Musculoskeletal: Negative for gait problem or joint swelling.  Skin: Negative for rash.  Neurological: Negative for dizziness or headache.  No other specific complaints in a complete review of systems (except as listed in HPI above).   Objective  Vitals:   12/12/17 1529  BP: 122/78  Pulse: 91  Resp: 16  Temp: 98.3 F (36.8 C)  TempSrc: Oral  SpO2: 97%  Weight: 194 lb 9.6 oz (88.3 kg)  Height: 5\' 8"  (1.727 m)    Body mass index is 29.59 kg/m.  Physical Exam Vital signs reviewed. Constitutional: Patient appears well-developed and well-nourished. No distress.  HENT: Head: Normocephalic and atraumatic. Ears: B TMs ok, no erythema or effusion; Nose: Nose normal. Mouth/Throat: Oropharynx is clear and moist. No oropharyngeal exudate.  Eyes: Conjunctivae and EOM are normal. Pupils are equal, round, and reactive to light. No scleral icterus.  Neck: Normal range of motion. Neck supple. No JVD present. No thyromegaly present.  Cardiovascular: Normal rate, regular rhythm and normal heart sounds.  No murmur heard. No BLE  edema. Pulmonary/Chest: Effort normal and breath sounds normal. No respiratory distress. Abdominal: Soft. Bowel sounds are normal, no distension. There is no tenderness. no masses Musculoskeletal: Normal range of motion, no joint effusions. No gross deformities Neurological: he is alert and oriented to person, place, and time. No cranial nerve deficit. Coordination, balance, strength, speech and gait are normal.  Skin: Skin is warm and dry. No rash noted. No erythema.  Psychiatric: Patient has a normal mood and affect. behavior is normal. Judgment and thought content normal.  PHQ2/9: [facility] 12/12/2017 11/22/2016 08/27/2015  Decreased Interest 0 0 0  Down, Depressed, Hopeless 0 0 0  PHQ - 2 Score 0 0 0    Fall Risk: Fall Risk  12/12/2017 11/22/2016 08/27/2015  Falls in the past year? No No No    Assessment & Plan RTC in 1 year for F/U: HTN, HLD; CPE

## 2017-12-12 NOTE — Assessment & Plan Note (Addendum)
-  Prostate cancer screening and PSA options (with potential risks and benefits of testing vs not testing) were discussed along with recent recs/guidelines. -USPSTF grade A and B recommendations reviewed with patient; age-appropriate recommendations, preventive care, screening tests, etc discussed and encouraged; healthy living encouraged; see AVS for patient education given to patient -Discussed importance of 150 minutes of physical activity weekly,eat 6 servings of fruit/vegetables daily and drink plenty of water and avoid sweet beverages.  -Follow up and care instructions discussed and provided in AVS.   -Reviewed Health Maintenance:   Need for vaccination with 13-polyvalent pneumococcal conjugate vaccine- given today  Screening for prostate cancer- PSA; Future

## 2017-12-12 NOTE — Assessment & Plan Note (Addendum)
Stable, continue amlodipine Update labs - amLODipine (NORVASC) 5 MG tablet; TAKE 1 TABLET(5 MG) BY MOUTH DAILY. Must keep appt w/new provider for refills  Dispense: 90 tablet; Refill: 0 - Comprehensive metabolic panel; Future - CBC; Future

## 2017-12-12 NOTE — Assessment & Plan Note (Addendum)
Stable, continue rosuvastatin Update labs-ate lunch at 1230 but would prefer labs today due to working full time, cant get back to lab - rosuvastatin (CRESTOR) 10 MG tablet; Take 1 tablet by mouth daily.  Dispense: 90 tablet; Refill: 2 - Lipid panel; Future - Comprehensive metabolic panel; Future - CBC; Future

## 2017-12-20 DIAGNOSIS — K219 Gastro-esophageal reflux disease without esophagitis: Secondary | ICD-10-CM | POA: Diagnosis not present

## 2017-12-20 DIAGNOSIS — J300 Vasomotor rhinitis: Secondary | ICD-10-CM | POA: Insufficient documentation

## 2017-12-20 DIAGNOSIS — J3489 Other specified disorders of nose and nasal sinuses: Secondary | ICD-10-CM | POA: Diagnosis not present

## 2017-12-20 DIAGNOSIS — R6889 Other general symptoms and signs: Secondary | ICD-10-CM | POA: Diagnosis not present

## 2018-04-08 ENCOUNTER — Other Ambulatory Visit: Payer: Self-pay | Admitting: Nurse Practitioner

## 2018-04-08 DIAGNOSIS — E782 Mixed hyperlipidemia: Secondary | ICD-10-CM

## 2018-07-07 ENCOUNTER — Other Ambulatory Visit: Payer: Self-pay | Admitting: Nurse Practitioner

## 2018-07-30 ENCOUNTER — Emergency Department (HOSPITAL_BASED_OUTPATIENT_CLINIC_OR_DEPARTMENT_OTHER)
Admission: EM | Admit: 2018-07-30 | Discharge: 2018-07-30 | Disposition: A | Discharge status: Home / Self Care | Payer: Medicare HMO | Attending: Emergency Medicine | Admitting: Emergency Medicine

## 2018-07-30 ENCOUNTER — Encounter (HOSPITAL_BASED_OUTPATIENT_CLINIC_OR_DEPARTMENT_OTHER): Payer: Self-pay | Admitting: Emergency Medicine

## 2018-07-30 ENCOUNTER — Other Ambulatory Visit: Payer: Self-pay

## 2018-07-30 DIAGNOSIS — K5909 Other constipation: Secondary | ICD-10-CM | POA: Diagnosis not present

## 2018-07-30 DIAGNOSIS — K5900 Constipation, unspecified: Secondary | ICD-10-CM | POA: Diagnosis present

## 2018-07-30 DIAGNOSIS — Z79899 Other long term (current) drug therapy: Secondary | ICD-10-CM | POA: Diagnosis not present

## 2018-07-30 DIAGNOSIS — I10 Essential (primary) hypertension: Secondary | ICD-10-CM | POA: Diagnosis not present

## 2018-07-30 DIAGNOSIS — E785 Hyperlipidemia, unspecified: Secondary | ICD-10-CM | POA: Insufficient documentation

## 2018-07-30 LAB — OCCULT BLOOD X 1 CARD TO LAB, STOOL: FECAL OCCULT BLD: NEGATIVE

## 2018-07-30 MED ORDER — POLYETHYLENE GLYCOL 3350 17 G PO PACK: 17.0000 g | PACK | Freq: Every day | ORAL | 0 refills | Status: DC

## 2018-07-30 MED ORDER — DOCUSATE SODIUM 100 MG PO CAPS: 100.0000 mg | ORAL_CAPSULE | Freq: Two times a day (BID) | ORAL | 0 refills | Status: DC

## 2018-07-30 MED ORDER — FLEET ENEMA 7-19 GM/118ML RE ENEM
1.0000 | ENEMA | Freq: Once | RECTAL | Status: AC
  Administered 2018-07-30: 1 via RECTAL
  Filled 2018-07-30: qty 1

## 2018-07-30 NOTE — ED Triage Notes (Signed)
Pt c/o constipation. Reports last BM was Friday. Pt denies N/V or abdominal pain.

## 2018-07-30 NOTE — ED Provider Notes (Signed)
[facility] EMERGENCY DEPARTMENT Provider Note   CSN: [id number] Arrival date & time: 07/30/18  0734     History   Chief Complaint Chief Complaint  Patient presents with  . Constipation    HPI Christopher Mcfarland is a 72 y.o. male.  HPI Patient reports that he has constipation.  He normally has a bowel movement every morning at 5 AM.  His last bowel movement was day before yesterday.  He was straining at stool this morning and unable to pass stool.  Reports yesterday he was trying to drink some fiber supplements.  He denies he is having abdominal pain.  He reports he does have rectal pain when he is trying to strain at stool.  The only recent change in his diet is that he added a nutritional protein supplement from the supplement store.  He works out regularly.  He reports his diet has not otherwise changed.  Patient denies any pain with eating, no loss of appetite, no abdominal pain with eating.  Patient reports that he goes to his physician for regular screening and has had screening colonoscopies although he cannot remember the date of his last one.  He believes it is within the past couple years.  He has had no fevers, no weight loss.  He has had no urinary dysfunction.  No pain no burning with urination. Past Medical History:  Diagnosis Date  . Essential hypertension   . Hepatitis C 2003   Treated  . Hyperlipidemia     Patient Active Problem List   Diagnosis Date Noted  . Routine adult health maintenance 11/22/2016  . Medicare annual wellness visit, initial 11/22/2016  . Osteoarthritis of both hands 09/02/2016  . Essential hypertension 09/02/2016  . Hyperlipidemia 09/02/2016  . Liver mass 01/08/2016  . Liver fibrosis 01/08/2016  . NEUTROPENIA UNSPECIFIED 09/01/2009  . COCAINE ABUSE 09/01/2009  . Chronic hepatitis C without hepatic coma (HCC) 08/15/2009  . TRIGGER FINGER 08/15/2009  . URINALYSIS, ABNORMAL 08/15/2009  . COLONIC POLYPS, HX OF 08/15/2009     Past Surgical History:  Procedure Laterality Date  . CARPAL TUNNEL RELEASE     bilat 1999  . HEMORRHOID SURGERY     1999/4 polyps removed rectum  . TRIGGER FINGER RELEASE     right hand 1999        Home Medications    Prior to Admission medications   Medication Sig Start Date End Date Taking? Authorizing Provider  amLODipine (NORVASC) 5 MG tablet TAKE 1 TABLET(5 MG) BY MOUTH DAILY. Must keep appt w/new provider for refills Patient taking differently: Take 10 mg by mouth daily. TAKE 1 TABLET(5 MG) BY MOUTH DAILY. Must keep appt w/new provider for refills 12/12/17   Evaristo Bury, NP  docusate sodium (COLACE) 100 MG capsule Take 1 capsule (100 mg total) by mouth every 12 (twelve) hours. 07/30/18   Arby Barrette, MD  polyethylene glycol (MIRALAX / GLYCOLAX) packet Take 17 g by mouth daily. 07/30/18   Arby Barrette, MD  rosuvastatin (CRESTOR) 10 MG tablet Take 1 tablet by mouth daily. 12/12/17   Evaristo Bury, NP  rosuvastatin (CRESTOR) 10 MG tablet TAKE 1 TABLET BY MOUTH EVERY DAY 07/10/18   Evaristo Bury, NP    Family History Family History  Problem Relation Age of Onset  . CAD Brother 86  . Kidney cancer Mother   . Bone cancer Father   . Colon cancer Neg Hx   . Pancreatic cancer Neg Hx   .  Rectal cancer Neg Hx   . Stomach cancer Neg Hx     Social History Social History   Tobacco Use  . Smoking status: Never Smoker  . Smokeless tobacco: Never Used  Substance Use Topics  . Alcohol use: Yes    Alcohol/week: 2.0 - 3.0 standard drinks    Types: 2 - 3 Shots of liquor per week  . Drug use: No    Comment: smoked marijuana "years ago"     Allergies   Patient has no known allergies.   Review of Systems Review of Systems 10 Systems reviewed and are negative for acute change except as noted in the HPI.   Physical Exam Updated Vital Signs BP (!) 150/95   Pulse 84   Temp 98 F (36.7 C) (Oral)   Resp 18   Ht 5\' 7"  (1.702 m)   Wt 83.9 kg    SpO2 100%   BMI 28.98 kg/m   Physical Exam  Constitutional: He is oriented to person, place, and time.  Patient is clinically well in appearance.  He is alert and nontoxic.  Well-nourished well-developed.  HENT:  Head: Normocephalic and atraumatic.  Eyes: Conjunctivae and EOM are normal.  Cardiovascular: Normal rate, regular rhythm, normal heart sounds and intact distal pulses.  Pulmonary/Chest: Effort normal and breath sounds normal.  Abdominal: Soft.    No acute distress.  Bowel sounds slightly hyperactive.  Abdomen is soft and nontender.  No palpable mass.    Genitourinary:  Genitourinary Comments: Normal external examination of perianal area.  Patient has 1 small about 3 mm nonthrombosed hemorrhoidal tag.  Digital exam shows a firm stool that is just at the tip of my reach.  Brownish-green in color.  Prostate not enlarged.  Mildly tender.  No visible blood on exam.  Musculoskeletal: He exhibits no edema or tenderness.  Neurological: He is alert and oriented to person, place, and time. He exhibits normal muscle tone. Coordination normal.  Skin: Skin is warm.  Psychiatric: He has a normal mood and affect.     ED Treatments / Results  Labs (all labs ordered are listed, but only abnormal results are displayed) Labs Reviewed  OCCULT BLOOD X 1 CARD TO LAB, STOOL    EKG None  Radiology No results found.  Procedures Procedures (including critical care time)  Medications Ordered in ED Medications  sodium phosphate (FLEET) 7-19 GM/118ML enema 1 enema (1 enema Rectal Given 07/30/18 0820)     Initial Impression / Assessment and Plan / ED Course  I have reviewed the triage vital signs and the nursing notes.  Pertinent labs & imaging results that were available during my care of the patient were reviewed by me and considered in my medical decision making (see chart for details).  Clinical Course as of Jul 30 842  Wynelle Link Jul 30, 2018  1610 Patient has had a large bowel  movement after enema.  He is up and ambulatory and comfortable.  He reports "I am straight".  Patient was counseled on subsequent plan of management for constipation.   [MP]    Clinical Course User Index [MP] Arby Barrette, MD   Patient presents with constipation.  He does not have any associated abdominal pain symptoms were resolved with fleets enema.  Likely etiology is addition of protein supplement this week to his diet.  Patient reports he is up-to-date on screening colonoscopies.  He is counseled to follow-up with his PCP and make sure that he is all up-to-date and if any  additional diagnostic studies are needed.  Final Clinical Impressions(s) / ED Diagnoses   Final diagnoses:  Other constipation    ED Discharge Orders         Ordered    polyethylene glycol (MIRALAX / GLYCOLAX) packet  Daily     07/30/18 0839    docusate sodium (COLACE) 100 MG capsule  Every 12 hours     07/30/18 0840           Arby BarrettePfeiffer, Markeya Mincy, MD 07/30/18 [phone]

## 2018-10-05 ENCOUNTER — Other Ambulatory Visit: Payer: Self-pay | Admitting: Nurse Practitioner

## 2018-10-05 DIAGNOSIS — I10 Essential (primary) hypertension: Secondary | ICD-10-CM

## 2018-10-17 DIAGNOSIS — H524 Presbyopia: Secondary | ICD-10-CM | POA: Diagnosis not present

## 2018-12-14 ENCOUNTER — Encounter: Payer: Medicare HMO | Admitting: Nurse Practitioner

## 2018-12-15 ENCOUNTER — Encounter: Payer: Medicare HMO | Admitting: Nurse Practitioner

## 2019-01-03 ENCOUNTER — Other Ambulatory Visit: Payer: Self-pay | Admitting: *Deleted

## 2019-01-03 DIAGNOSIS — I10 Essential (primary) hypertension: Secondary | ICD-10-CM

## 2019-01-03 MED ORDER — ROSUVASTATIN CALCIUM 10 MG PO TABS: ORAL_TABLET | ORAL | 2 refills | Status: DC

## 2019-01-03 MED ORDER — AMLODIPINE BESYLATE 5 MG PO TABS: ORAL_TABLET | ORAL | 0 refills | Status: DC

## 2019-03-07 ENCOUNTER — Encounter: Payer: Medicare HMO | Admitting: Nurse Practitioner

## 2019-03-07 ENCOUNTER — Other Ambulatory Visit: Payer: Self-pay

## 2019-03-07 ENCOUNTER — Other Ambulatory Visit (INDEPENDENT_AMBULATORY_CARE_PROVIDER_SITE_OTHER): Payer: Medicare HMO

## 2019-03-07 ENCOUNTER — Ambulatory Visit (INDEPENDENT_AMBULATORY_CARE_PROVIDER_SITE_OTHER): Payer: Medicare HMO | Admitting: Internal Medicine

## 2019-03-07 ENCOUNTER — Encounter: Payer: Self-pay | Admitting: Internal Medicine

## 2019-03-07 ENCOUNTER — Ambulatory Visit (INDEPENDENT_AMBULATORY_CARE_PROVIDER_SITE_OTHER): Payer: Medicare HMO | Admitting: *Deleted

## 2019-03-07 VITALS — BP 124/86 | HR 106 | Ht 67.0 in | Wt 204.0 lb

## 2019-03-07 VITALS — BP 124/86 | HR 106 | Temp 98.8°F | Ht 67.0 in | Wt 204.0 lb

## 2019-03-07 DIAGNOSIS — E782 Mixed hyperlipidemia: Secondary | ICD-10-CM | POA: Diagnosis not present

## 2019-03-07 DIAGNOSIS — E538 Deficiency of other specified B group vitamins: Secondary | ICD-10-CM

## 2019-03-07 DIAGNOSIS — R739 Hyperglycemia, unspecified: Secondary | ICD-10-CM | POA: Diagnosis not present

## 2019-03-07 DIAGNOSIS — Z23 Encounter for immunization: Secondary | ICD-10-CM

## 2019-03-07 DIAGNOSIS — E559 Vitamin D deficiency, unspecified: Secondary | ICD-10-CM

## 2019-03-07 DIAGNOSIS — E611 Iron deficiency: Secondary | ICD-10-CM

## 2019-03-07 DIAGNOSIS — Z0000 Encounter for general adult medical examination without abnormal findings: Secondary | ICD-10-CM

## 2019-03-07 DIAGNOSIS — Z0001 Encounter for general adult medical examination with abnormal findings: Secondary | ICD-10-CM | POA: Diagnosis not present

## 2019-03-07 DIAGNOSIS — I10 Essential (primary) hypertension: Secondary | ICD-10-CM

## 2019-03-07 DIAGNOSIS — N529 Male erectile dysfunction, unspecified: Secondary | ICD-10-CM | POA: Diagnosis not present

## 2019-03-07 LAB — CBC WITH DIFFERENTIAL/PLATELET
Basophils Absolute: 0.1 10*3/uL (ref 0.0–0.1)
Basophils Relative: 1 % (ref 0.0–3.0)
Eosinophils Absolute: 0.1 10*3/uL (ref 0.0–0.7)
Eosinophils Relative: 1.2 % (ref 0.0–5.0)
HCT: 40.6 % (ref 39.0–52.0)
Hemoglobin: 13.4 g/dL (ref 13.0–17.0)
Lymphocytes Relative: 46.3 % — ABNORMAL HIGH (ref 12.0–46.0)
Lymphs Abs: 2.6 10*3/uL (ref 0.7–4.0)
MCHC: 33.1 g/dL (ref 30.0–36.0)
MCV: 88.1 fl (ref 78.0–100.0)
Monocytes Absolute: 0.5 10*3/uL (ref 0.1–1.0)
Monocytes Relative: 8.7 % (ref 3.0–12.0)
Neutro Abs: 2.4 10*3/uL (ref 1.4–7.7)
Neutrophils Relative %: 42.8 % — ABNORMAL LOW (ref 43.0–77.0)
Platelets: 186 10*3/uL (ref 150.0–400.0)
RBC: 4.61 Mil/uL (ref 4.22–5.81)
RDW: 14.2 % (ref 11.5–15.5)
WBC: 5.6 10*3/uL (ref 4.0–10.5)

## 2019-03-07 MED ORDER — SILDENAFIL CITRATE 100 MG PO TABS: 50.0000 mg | ORAL_TABLET | Freq: Every day | ORAL | 11 refills | Status: DC | PRN

## 2019-03-07 NOTE — Patient Instructions (Signed)
You had the Pneumovax pneumonia shot today  Please take all new medication as prescribed - the viagra  Please continue all other medications as before, and refills have been done if requested.  Please have the pharmacy call with any other refills you may need.  Please continue your efforts at being more active, low cholesterol diet, and weight control.  You are otherwise up to date with prevention measures today.  Please keep your appointments with your specialists as you may have planned  Please go to the LAB in the Basement (turn left off the elevator) for the tests to be done today  You will be contacted by phone if any changes need to be made immediately.  Otherwise, you will receive a letter about your results with an explanation, but please check with MyChart first.  Please remember to sign up for MyChart if you have not done so, as this will be important to you in the future with finding out test results, communicating by private email, and scheduling acute appointments online when needed.  Please return in 1 year for your yearly visit, or sooner if needed, with Lab testing done 3-5 days before

## 2019-03-07 NOTE — Progress Notes (Signed)
Subjective:    Patient ID: Christopher Mcfarland, male    DOB: 27-Apr-1946, 73 y.o.   MRN: [id number]  HPI  Here for wellness and f/u;  Overall doing ok;  Pt denies Chest pain, worsening SOB, DOE, wheezing, orthopnea, PND, worsening LE edema, palpitations, dizziness or syncope.  Pt denies neurological change such as new headache, facial or extremity weakness.  Pt denies polydipsia, polyuria, or low sugar symptoms. Pt states overall good compliance with treatment and medications, good tolerability, and has been trying to follow appropriate diet.  Pt denies worsening depressive symptoms, suicidal ideation or panic. No fever, night sweats, wt loss, loss of appetite, or other constitutional symptoms.  Pt states good ability with ADL's, has low fall risk, home safety reviewed and adequate, no other significant changes in hearing or vision, and only occasionally active with exercise. Also c/o 6 mo worsening moderate intermittent ED symptoms, worse when tired, better with rest  Nothing else makes better or worse.  Just cant seem to maintain to completion Past Medical History:  Diagnosis Date  . Essential hypertension   . Hepatitis C 2003   Treated  . Hyperlipidemia    Past Surgical History:  Procedure Laterality Date  . CARPAL TUNNEL RELEASE     bilat 1999  . HEMORRHOID SURGERY     1999/4 polyps removed rectum  . TRIGGER FINGER RELEASE     right hand 1999    reports that he has never smoked. He has never used smokeless tobacco. He reports current alcohol use of about 2.0 - 3.0 standard drinks of alcohol per week. He reports that he does not use drugs. family history includes Bone cancer in his father; CAD (age of onset: 74) in his brother; Kidney cancer in his mother. No Known Allergies Current Outpatient Medications on File Prior to Visit  Medication Sig Dispense Refill  . amLODipine (NORVASC) 5 MG tablet TAKE 1 TABLET(5 MG) BY MOUTH DAILY. MUST KEEP 03/07/19 visit. 90 tablet 0  . docusate sodium  (COLACE) 100 MG capsule Take 1 capsule (100 mg total) by mouth every 12 (twelve) hours. 60 capsule 0  . polyethylene glycol (MIRALAX / GLYCOLAX) packet Take 17 g by mouth daily. 14 each 0  . rosuvastatin (CRESTOR) 10 MG tablet Take 1 tablet by mouth daily. 90 tablet 2  . rosuvastatin (CRESTOR) 10 MG tablet TAKE 1 TABLET BY MOUTH EVERY DAY. MUST KEEP 03/07/19 visit. 30 tablet 2   No current facility-administered medications on file prior to visit.    Review of Systems Constitutional: Negative for other unusual diaphoresis, sweats, appetite or weight changes HENT: Negative for other worsening hearing loss, ear pain, facial swelling, mouth sores or neck stiffness.   Eyes: Negative for other worsening pain, redness or other visual disturbance.  Respiratory: Negative for other stridor or swelling Cardiovascular: Negative for other palpitations or other chest pain  Gastrointestinal: Negative for worsening diarrhea or loose stools, blood in stool, distention or other pain Genitourinary: Negative for hematuria, flank pain or other change in urine volume.  Musculoskeletal: Negative for myalgias or other joint swelling.  Skin: Negative for other color change, or other wound or worsening drainage.  Neurological: Negative for other syncope or numbness. Hematological: Negative for other adenopathy or swelling Psychiatric/Behavioral: Negative for hallucinations, other worsening agitation, SI, self-injury, or new decreased concentration ALl other system neg per pt    Objective:   Physical Exam BP 124/86   Pulse (!) 106   Temp 98.8 F (37.1 C) (Oral)  Ht 5\' 7"  (1.702 m)   Wt 204 lb (92.5 kg)   SpO2 98%   BMI 31.95 kg/m  VS noted,  Constitutional: Pt is oriented to person, place, and time. Appears well-developed and well-nourished, in no significant distress and comfortable Head: Normocephalic and atraumatic  Eyes: Conjunctivae and EOM are normal. Pupils are equal, round, and reactive to light  Right Ear: External ear normal without discharge Left Ear: External ear normal without discharge Nose: Nose without discharge or deformity Mouth/Throat: Oropharynx is without other ulcerations and moist  Neck: Normal range of motion. Neck supple. No JVD present. No tracheal deviation present or significant neck LA or mass Cardiovascular: Normal rate, regular rhythm, normal heart sounds and intact distal pulses.   Pulmonary/Chest: WOB normal and breath sounds without rales or wheezing  Abdominal: Soft. Bowel sounds are normal. NT. No HSM  Musculoskeletal: Normal range of motion. Exhibits no edema Lymphadenopathy: Has no other cervical adenopathy.  Neurological: Pt is alert and oriented to person, place, and time. Pt has normal reflexes. No cranial nerve deficit. Motor grossly intact, Gait intact Skin: Skin is warm and dry. No rash noted or new ulcerations Psychiatric:  Has normal mood and affect. Behavior is normal without agitation No other exam findings Lab Results  Component Value Date   WBC 5.6 03/07/2019   HGB 13.4 03/07/2019   HCT 40.6 03/07/2019   PLT 186.0 03/07/2019   GLUCOSE 88 03/07/2019   CHOL 167 03/07/2019   TRIG 70.0 03/07/2019   HDL 47.60 03/07/2019   LDLCALC 105 (H) 03/07/2019   ALT 21 03/07/2019   AST 21 03/07/2019   NA 142 03/07/2019   K 4.1 03/07/2019   CL 105 03/07/2019   CREATININE 1.29 03/07/2019   BUN 18 03/07/2019   CO2 27 03/07/2019   TSH 1.18 03/07/2019   PSA 0.55 03/07/2019   INR 1.17 08/20/2015   HGBA1C 6.0 03/07/2019      Assessment & Plan:

## 2019-03-07 NOTE — Progress Notes (Addendum)
Subjective:   Christopher Mcfarland is a 73 y.o. male who presents for Medicare Annual/Subsequent preventive examination.  Review of Systems:   Cardiac Risk Factors include: advanced age (>6155men, 68>65 women);dyslipidemia;hypertension;male gender Sleep patterns: feels rested on waking, gets up 0-1 times nightly to void and sleeps 6 hours nightly.    Home Safety/Smoke Alarms: Feels safe in home. Smoke alarms in place.  Living environment; residence and Firearm Safety: 1-story house/ trailer. Lives with wife, no needs for DME, good support system Seat Belt Safety/Bike Helmet: Wears seat belt.   PSA-  Lab Results  Component Value Date   PSA 0.77 12/12/2017   PSA 0.60 11/22/2016   PSA 0.47 08/15/2009       Objective:    Vitals: There were no vitals taken for this visit.  There is no height or weight on file to calculate BMI.  Advanced Directives 03/07/2019 07/30/2018 08/12/2017 06/10/2017 06/10/2017 06/12/2014 05/29/2014  Does Patient Have a Medical Advance Directive? No No No No No No No  Would patient like information on creating a medical advance directive? Yes (MAU/Ambulatory/Procedural Areas - Information given) No - Patient declined - - No - Patient declined - No - patient declined information    Tobacco Social History   Tobacco Use  Smoking Status Never Smoker  Smokeless Tobacco Never Used     Counseling given: Not Answered  Past Medical History:  Diagnosis Date  . Essential hypertension   . Hepatitis C 2003   Treated  . Hyperlipidemia    Past Surgical History:  Procedure Laterality Date  . CARPAL TUNNEL RELEASE     bilat 1999  . HEMORRHOID SURGERY     1999/4 polyps removed rectum  . TRIGGER FINGER RELEASE     right hand 1999   Family History  Problem Relation Age of Onset  . CAD Brother 2065  . Kidney cancer Mother   . Bone cancer Father   . Colon cancer Neg Hx   . Pancreatic cancer Neg Hx   . Rectal cancer Neg Hx   . Stomach cancer Neg Hx    Social History    Socioeconomic History  . Marital status: Married    Spouse name: Not on file  . Number of children: 4  . Years of education: 9312  . Highest education level: Not on file  Occupational History  . Occupation: [employer]  Social Needs  . Financial resource strain: Not hard at all  . Food insecurity    Worry: Never true    Inability: Never true  . Transportation needs    Medical: No    Non-medical: No  Tobacco Use  . Smoking status: Never Smoker  . Smokeless tobacco: Never Used  Substance and Sexual Activity  . Alcohol use: Yes    Alcohol/week: 2.0 - 3.0 standard drinks    Types: 2 - 3 Shots of liquor per week  . Drug use: No    Comment: smoked marijuana "years ago"  . Sexual activity: Yes  Lifestyle  . Physical activity    Days per week: 5 days    Minutes per session: 50 min  . Stress: Not at all  Relationships  . Social connections    Talks on phone: More than three times a week    Gets together: More than three times a week    Attends religious service: 1 to 4 times per year    Active member of club or organization: Yes    Attends meetings of clubs  or organizations: More than 4 times per year    Relationship status: Married  Other Topics Concern  . Not on file  Social History Narrative   Fun: Play pool, yard work, gym, and paint    Outpatient Encounter Medications as of 03/07/2019  Medication Sig  . amLODipine (NORVASC) 5 MG tablet TAKE 1 TABLET(5 MG) BY MOUTH DAILY. MUST KEEP 03/07/19 visit.  Marland Kitchen. docusate sodium (COLACE) 100 MG capsule Take 1 capsule (100 mg total) by mouth every 12 (twelve) hours.  . polyethylene glycol (MIRALAX / GLYCOLAX) packet Take 17 g by mouth daily.  . rosuvastatin (CRESTOR) 10 MG tablet Take 1 tablet by mouth daily.  . rosuvastatin (CRESTOR) 10 MG tablet TAKE 1 TABLET BY MOUTH EVERY DAY. MUST KEEP 03/07/19 visit.   No facility-administered encounter medications on file as of 03/07/2019.     Activities of Daily Living In your present state of  health, do you have any difficulty performing the following activities: 03/07/2019  Hearing? N  Vision? N  Difficulty concentrating or making decisions? N  Walking or climbing stairs? N  Dressing or bathing? N  Doing errands, shopping? N  Preparing Food and eating ? N  Using the Toilet? N  In the past six months, have you accidently leaked urine? N  Do you have problems with loss of bowel control? N  Managing your Medications? N  Managing your Finances? N  Housekeeping or managing your Housekeeping? N  Some recent data might be hidden    Patient Care Team: Corwin LevinsJohn, [name], MD as PCP - General (Internal Medicine) Myrtie Neitheranis, Andreas BlowerHenry L III, MD as Consulting Physician (Gastroenterology) Comer, Belia Hemanobert W, MD as Consulting Physician (Infectious Diseases) Christia ReadingBates, Dwight, MD as Consulting Physician (Otolaryngology)   Assessment:   This is a routine wellness examination for Christopher Mcfarland. Physical assessment deferred to PCP.   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 Patient Stated     I want to lose weight by increasing my physical activity and use portion control.       Fall Risk Fall Risk  03/07/2019 03/07/2019 12/12/2017 11/22/2016 08/27/2015  Falls in the past year? 0 0 No No No  Number falls in past yr: 0 - - - -    Depression Screen PHQ 2/9 Scores 03/07/2019 03/07/2019 12/12/2017 11/22/2016  PHQ - 2 Score 0 0 0 0    Cognitive Function        Ad8 score reviewed for issues:  Issues making decisions: no  Less interest in hobbies / activities: no  Repeats questions, stories (family complaining): no  Trouble using ordinary gadgets (microwave, computer, phone):no  Forgets the month or year: no  Mismanaging finances: no  Remembering appts: no  Daily problems with thinking and/or memory: no Ad8 score is= 0  Immunization History  Administered Date(s) Administered  . Influenza Whole 08/15/2009  . Influenza,inj,Quad PF,6+ Mos 08/27/2015  . Pneumococcal Conjugate-13 12/12/2017  . Pneumococcal Polysaccharide-23 03/07/2019  . Td 10/21/2009   Screening Tests Health Maintenance  Topic Date Due  . PNA vac Low  Risk Adult (2 of 2 - PPSV23) 12/13/2018  . INFLUENZA VACCINE  04/14/2019  . TETANUS/TDAP  10/22/2019  . COLONOSCOPY  06/12/2024  . Hepatitis C Screening  Completed      Plan:    Reviewed health maintenance screenings with patient today and relevant education, vaccines, and/or referrals were provided.   I have personally reviewed and noted the following in the patient's chart:   . Medical and social history . Use of alcohol, tobacco or illicit drugs  . Current medications and supplements . Functional ability and status . Nutritional status . Physical activity . Advanced directives . List of other physicians . Vitals . Screenings to include cognitive, depression, and falls . Referrals and appointments  In addition, I have reviewed and discussed with patient certain preventive protocols, quality metrics, and best practice recommendations. A written personalized care plan for preventive services as well as general preventive health recommendations were provided to patient.     Michiel Cowboy, RN  03/07/2019  Medical screening examination/treatment/procedure(s) were performed by non-physician practitioner and as supervising physician I was immediately available for consultation/collaboration. I agree with  above. Cathlean Cower, MD

## 2019-03-07 NOTE — Patient Instructions (Signed)
Continue doing brain stimulating activities (puzzles, reading, adult coloring books, staying active) to keep memory sharp.   Continue to eat heart healthy diet (full of fruits, vegetables, whole grains, lean protein, water--limit salt, fat, and sugar intake) and increase physical activity as tolerated.   Christopher Mcfarland , Thank you for taking time to come for your Medicare Wellness Visit. I appreciate your ongoing commitment to your health goals. Please review the following plan we discussed and let me know if I can assist you in the future.   These are the goals we discussed: Goals    . Patient Stated     I want lose weight by increasing my physical activity and eat less.       This is a list of the screening recommended for you and due dates:  Health Maintenance  Topic Date Due  . Pneumonia vaccines (2 of 2 - PPSV23) 12/13/2018  . Flu Shot  04/14/2019  . Tetanus Vaccine  10/22/2019  . Colon Cancer Screening  06/12/2024  .  Hepatitis C: One time screening is recommended by Center for Disease Control  (CDC) for  adults born from [birth year] through 1963.   Completed    Preventive Care 47 Years and Older, Male Preventive care refers to lifestyle choices and visits with your health care provider that can promote health and wellness. What does preventive care include?   A yearly physical exam. This is also called an annual well check.  Dental exams once or twice a year.  Routine eye exams. Ask your health care provider how often you should have your eyes checked.  Personal lifestyle choices, including: ? Daily care of your teeth and gums. ? Regular physical activity. ? Eating a healthy diet. ? Avoiding tobacco and drug use. ? Limiting alcohol use. ? Practicing safe sex. ? Taking low doses of aspirin every day. ? Taking vitamin and mineral supplements as recommended by your health care provider. What happens during an annual well check? The services and screenings done by your  health care provider during your annual well check will depend on your age, overall health, lifestyle risk factors, and family history of disease. Counseling Your health care provider may ask you questions about your:  Alcohol use.  Tobacco use.  Drug use.  Emotional well-being.  Home and relationship well-being.  Sexual activity.  Eating habits.  History of falls.  Memory and ability to understand (cognition).  Work and work Statistician. Screening You may have the following tests or measurements:  Height, weight, and BMI.  Blood pressure.  Lipid and cholesterol levels. These may be checked every 5 years, or more frequently if you are over 51 years old.  Skin check.  Lung cancer screening. You may have this screening every year starting at age 77 if you have a 30-pack-year history of smoking and currently smoke or have quit within the past 15 years.  Colorectal cancer screening. All adults should have this screening starting at age 74 and continuing until age 64. You will have tests every 1-10 years, depending on your results and the type of screening test. People at increased risk should start screening at an earlier age. Screening tests may include: ? Guaiac-based fecal occult blood testing. ? Fecal immunochemical test (FIT). ? Stool DNA test. ? Virtual colonoscopy. ? Sigmoidoscopy. During this test, a flexible tube with a tiny camera (sigmoidoscope) is used to examine your rectum and lower colon. The sigmoidoscope is inserted through your anus into your rectum and lower  colon. ? Colonoscopy. During this test, a long, thin, flexible tube with a tiny camera (colonoscope) is used to examine your entire colon and rectum.  Prostate cancer screening. Recommendations will vary depending on your family history and other risks.  Hepatitis C blood test.  Hepatitis B blood test.  Sexually transmitted disease (STD) testing.  Diabetes screening. This is done by checking your  blood sugar (glucose) after you have not eaten for a while (fasting). You may have this done every 1-3 years.  Abdominal aortic aneurysm (AAA) screening. You may need this if you are a current or former smoker.  Osteoporosis. You may be screened starting at age 83 if you are at high risk. Talk with your health care provider about your test results, treatment options, and if necessary, the need for more tests. Vaccines Your health care provider may recommend certain vaccines, such as:  Influenza vaccine. This is recommended every year.  Tetanus, diphtheria, and acellular pertussis (Tdap, Td) vaccine. You may need a Td booster every 10 years.  Varicella vaccine. You may need this if you have not been vaccinated.  Zoster vaccine. You may need this after age 49.  Measles, mumps, and rubella (MMR) vaccine. You may need at least one dose of MMR if you were born in 1957 or later. You may also need a second dose.  Pneumococcal 13-valent conjugate (PCV13) vaccine. One dose is recommended after age 68.  Pneumococcal polysaccharide (PPSV23) vaccine. One dose is recommended after age 94.  Meningococcal vaccine. You may need this if you have certain conditions.  Hepatitis A vaccine. You may need this if you have certain conditions or if you travel or work in places where you may be exposed to hepatitis A.  Hepatitis B vaccine. You may need this if you have certain conditions or if you travel or work in places where you may be exposed to hepatitis B.  Haemophilus influenzae type b (Hib) vaccine. You may need this if you have certain risk factors. Talk to your health care provider about which screenings and vaccines you need and how often you need them. This information is not intended to replace advice given to you by your health care provider. Make sure you discuss any questions you have with your health care provider. Document Released: 09/26/2015 Document Revised: 10/20/2017 Document Reviewed:  07/01/2015 Elsevier Interactive Patient Education  2019 [employer].

## 2019-03-08 ENCOUNTER — Other Ambulatory Visit: Payer: Self-pay | Admitting: Internal Medicine

## 2019-03-08 ENCOUNTER — Encounter: Payer: Self-pay | Admitting: Internal Medicine

## 2019-03-08 LAB — BASIC METABOLIC PANEL
BUN: 18 mg/dL (ref 6–23)
CO2: 27 mEq/L (ref 19–32)
Calcium: 9.8 mg/dL (ref 8.4–10.5)
Chloride: 105 mEq/L (ref 96–112)
Creatinine, Ser: 1.29 mg/dL (ref 0.40–1.50)
GFR: 66.38 mL/min (ref >60.00)
Glucose, Bld: 88 mg/dL (ref 70–99)
Potassium: 4.1 mEq/L (ref 3.5–5.1)
Sodium: 142 mEq/L (ref 135–145)

## 2019-03-08 LAB — IBC PANEL
Iron: 79 ug/dL (ref 42–165)
Saturation Ratios: 21.2 % (ref 20.0–50.0)
Transferrin: 266 mg/dL (ref 212.0–360.0)

## 2019-03-08 LAB — URINALYSIS, ROUTINE W REFLEX MICROSCOPIC
Bilirubin Urine: NEGATIVE
Hgb urine dipstick: NEGATIVE
Ketones, ur: NEGATIVE
Leukocytes,Ua: NEGATIVE
Nitrite: NEGATIVE
RBC / HPF: NONE SEEN (ref 0–?)
Specific Gravity, Urine: >=1.03 — AB (ref 1.000–1.030)
Total Protein, Urine: NEGATIVE
Urine Glucose: NEGATIVE
Urobilinogen, UA: 0.2 (ref 0.0–1.0)
WBC, UA: NONE SEEN (ref 0–?)
pH: 5.5 (ref 5.0–8.0)

## 2019-03-08 LAB — HEPATIC FUNCTION PANEL
ALT: 21 U/L (ref 0–53)
AST: 21 U/L (ref 0–37)
Albumin: 4.8 g/dL (ref 3.5–5.2)
Alkaline Phosphatase: 49 U/L (ref 39–117)
Bilirubin, Direct: 0.1 mg/dL (ref 0.0–0.3)
Total Bilirubin: 0.6 mg/dL (ref 0.2–1.2)
Total Protein: 8.2 g/dL (ref 6.0–8.3)

## 2019-03-08 LAB — LIPID PANEL
Cholesterol: 167 mg/dL (ref 0–200)
HDL: 47.6 mg/dL (ref >39.00)
LDL Cholesterol: 105 mg/dL — ABNORMAL HIGH (ref 0–99)
NonHDL: 119.13
Total CHOL/HDL Ratio: 4
Triglycerides: 70 mg/dL (ref 0.0–149.0)
VLDL: 14 mg/dL (ref 0.0–40.0)

## 2019-03-08 LAB — VITAMIN B12: Vitamin B-12: 390 pg/mL (ref 211–911)

## 2019-03-08 LAB — HEMOGLOBIN A1C: Hgb A1c MFr Bld: 6 % (ref 4.6–6.5)

## 2019-03-08 LAB — VITAMIN D 25 HYDROXY (VIT D DEFICIENCY, FRACTURES): VITD: 25.56 ng/mL — ABNORMAL LOW (ref 30.00–100.00)

## 2019-03-08 LAB — TSH: TSH: 1.18 u[IU]/mL (ref 0.35–4.50)

## 2019-03-08 LAB — PSA: PSA: 0.55 ng/mL (ref 0.10–4.00)

## 2019-03-08 MED ORDER — VITAMIN D (ERGOCALCIFEROL) 1.25 MG (50000 UNIT) PO CAPS: 50000.0000 [IU] | ORAL_CAPSULE | ORAL | 0 refills | Status: DC

## 2019-03-11 ENCOUNTER — Encounter: Payer: Self-pay | Admitting: Internal Medicine

## 2019-03-11 DIAGNOSIS — E559 Vitamin D deficiency, unspecified: Secondary | ICD-10-CM | POA: Insufficient documentation

## 2019-03-11 DIAGNOSIS — N529 Male erectile dysfunction, unspecified: Secondary | ICD-10-CM | POA: Insufficient documentation

## 2019-03-11 NOTE — Assessment & Plan Note (Addendum)
Mild to mod, for viagra prn, to f/u any worsening symptoms or concerns  In addition to the time spent performing CPE, I spent an additional 25 minutes face to face,in which greater than 50% of this time was spent in counseling and coordination of care for patient's acute illness as documented, including the differential dx, treatment, further evaluation and other management of ED, HTN, hyperglyemia, HLD, Vit d deficiency

## 2019-03-11 NOTE — Assessment & Plan Note (Signed)
stable overall by history and exam, recent data reviewed with pt, and pt to continue medical treatment as before,  to f/u any worsening symptoms or concerns  

## 2019-03-11 NOTE — Assessment & Plan Note (Signed)
For lab, may need replacement

## 2019-03-11 NOTE — Assessment & Plan Note (Signed)

## 2019-04-03 ENCOUNTER — Other Ambulatory Visit: Payer: Self-pay | Admitting: Internal Medicine

## 2019-04-03 DIAGNOSIS — I10 Essential (primary) hypertension: Secondary | ICD-10-CM

## 2019-06-27 ENCOUNTER — Ambulatory Visit (INDEPENDENT_AMBULATORY_CARE_PROVIDER_SITE_OTHER): Payer: Medicare HMO | Admitting: Internal Medicine

## 2019-06-27 ENCOUNTER — Encounter: Payer: Self-pay | Admitting: Internal Medicine

## 2019-06-27 ENCOUNTER — Other Ambulatory Visit: Payer: Self-pay

## 2019-06-27 DIAGNOSIS — M542 Cervicalgia: Secondary | ICD-10-CM

## 2019-06-27 MED ORDER — MELOXICAM 7.5 MG PO TABS: 7.5000 mg | ORAL_TABLET | Freq: Every day | ORAL | 0 refills | Status: DC

## 2019-06-27 MED ORDER — METHOCARBAMOL 750 MG PO TABS: 750.0000 mg | ORAL_TABLET | Freq: Four times a day (QID) | ORAL | 0 refills | Status: DC | PRN

## 2019-06-27 NOTE — Progress Notes (Signed)
Subjective:    Patient ID: JARMON JAVID, male    DOB: 1946/06/26, 73 y.o.   MRN: [id number]  HPI The patient is here for an acute visit.   Stiff neck:  About one week ago his neck pain started.  He woke up with the stiff neck - that night he used two pillows instead of one. This has happened to him in the past - about two years ago.  He has stiffness in the back of his neck on both sides. It hurts more with any movement and is worse at night.  He denies numbness/tingling or weakness in his arms/hands.  He denies headaches.    He has taken Advil a couple of times and it helped a little.  He tried arthritris cream and a heat pad - it helped a little.     Medications and allergies reviewed with patient and updated if appropriate.  Patient Active Problem List   Diagnosis Date Noted  . Erectile dysfunction 03/11/2019  . Vitamin D deficiency 03/11/2019  . Hyperglycemia 03/07/2019  . Encounter for well adult exam with abnormal findings 11/22/2016  . Medicare annual wellness visit, initial 11/22/2016  . Osteoarthritis of both hands 09/02/2016  . Essential hypertension 09/02/2016  . Hyperlipidemia 09/02/2016  . Liver mass 01/08/2016  . Liver fibrosis 01/08/2016  . NEUTROPENIA UNSPECIFIED 09/01/2009  . COCAINE ABUSE 09/01/2009  . Chronic hepatitis C without hepatic coma (HCC) 08/15/2009  . TRIGGER FINGER 08/15/2009  . URINALYSIS, ABNORMAL 08/15/2009  . COLONIC POLYPS, HX OF 08/15/2009    Current Outpatient Medications on File Prior to Visit  Medication Sig Dispense Refill  . amLODipine (NORVASC) 5 MG tablet TAKE 1 TABLET(5 MG) BY MOUTH DAILY 90 tablet 2  . docusate sodium (COLACE) 100 MG capsule Take 1 capsule (100 mg total) by mouth every 12 (twelve) hours. 60 capsule 0  . polyethylene glycol (MIRALAX / GLYCOLAX) packet Take 17 g by mouth daily. 14 each 0  . rosuvastatin (CRESTOR) 10 MG tablet Take 1 tablet by mouth daily. 90 tablet 2  . rosuvastatin (CRESTOR) 10 MG tablet  TAKE 1 TABLET BY MOUTH EVERY DAY 90 tablet 2  . sildenafil (VIAGRA) 100 MG tablet Take 0.5-1 tablets (50-100 mg total) by mouth daily as needed for erectile dysfunction. 5 tablet 11  . Vitamin D, Ergocalciferol, (DRISDOL) 1.25 MG (50000 UT) CAPS capsule Take 1 capsule (50,000 Units total) by mouth every 7 (seven) days. 12 capsule 0   No current facility-administered medications on file prior to visit.     Past Medical History:  Diagnosis Date  . Essential hypertension   . Hepatitis C 2003   Treated  . Hyperlipidemia     Past Surgical History:  Procedure Laterality Date  . CARPAL TUNNEL RELEASE     bilat 1999  . HEMORRHOID SURGERY     1999/4 polyps removed rectum  . TRIGGER FINGER RELEASE     right hand 1999    Social History   Socioeconomic History  . Marital status: Married    Spouse name: Not on file  . Number of children: 4  . Years of education: 46  . Highest education level: Not on file  Occupational History  . Occupation: [employer]  . Financial resource strain: Not hard at all  . Food insecurity    Worry: Never true    Inability: Never true  . Transportation needs    Medical: No    Non-medical: No  Tobacco Use  .  Smoking status: Never Smoker  . Smokeless tobacco: Never Used  Substance and Sexual Activity  . Alcohol use: Yes    Alcohol/week: 2.0 - 3.0 standard drinks    Types: 2 - 3 Shots of liquor per week  . Drug use: No    Comment: smoked marijuana "years ago"  . Sexual activity: Yes  Lifestyle  . Physical activity    Days per week: 5 days    Minutes per session: 50 min  . Stress: Not at all  Relationships  . Social connections    Talks on phone: More than three times a week    Gets together: More than three times a week    Attends religious service: 1 to 4 times per year    Active member of club or organization: Yes    Attends meetings of clubs or organizations: More than 4 times per year    Relationship status: Married  Other  Topics Concern  . Not on file  Social History Narrative   Fun: Play pool, yard work, gym, and Scientist, research (life sciences)    Family History  Problem Relation Age of Onset  . CAD Brother 72  . Kidney cancer Mother   . Bone cancer Father   . Colon cancer Neg Hx   . Pancreatic cancer Neg Hx   . Rectal cancer Neg Hx   . Stomach cancer Neg Hx     Review of Systems  Constitutional: Negative for chills and fever.  Musculoskeletal: Positive for neck pain and neck stiffness.  Neurological: Negative for dizziness, weakness, light-headedness, numbness and headaches.       Objective:   Vitals:   06/27/19 0951  BP: (!) 144/80  Pulse: 90  Resp: 16  Temp: 97.9 F (36.6 C)  SpO2: 97%   BP Readings from Last 3 Encounters:  06/27/19 (!) 144/80  03/07/19 124/86  03/07/19 124/86   Wt Readings from Last 3 Encounters:  06/27/19 203 lb (92.1 kg)  03/07/19 204 lb (92.5 kg)  03/07/19 204 lb (92.5 kg)   Body mass index is 31.79 kg/m.   Physical Exam Constitutional:      General: He is not in acute distress.    Appearance: Normal appearance. He is not ill-appearing.  HENT:     Head: Normocephalic and atraumatic.  Musculoskeletal:        General: Tenderness (tenderness posterior neck down to shoulders b/l, moving neck causes pain, slightly decreased ROM.  no deformity) present.  Neurological:     General: No focal deficit present.     Mental Status: He is alert.     Cranial Nerves: No cranial nerve deficit.     Sensory: No sensory deficit.     Motor: No weakness.            Assessment & Plan:    See Problem List for Assessment and Plan of chronic medical problems.

## 2019-06-27 NOTE — Patient Instructions (Addendum)
Take the muscle relaxer as needed.  Take the meloxicam daily with food.  Do not take any advil while taking meloxicam.     Apply heat and topical arthritis medications.   Genitally stretch the neck.     Please call if there is no improvement in your symptoms.

## 2019-06-27 NOTE — Assessment & Plan Note (Signed)
Muscle strain Continue heat, topical arthritis medication Gently stretch neck Methocarbamol Q 6 hr prn - he has taken this in the past and tolerated it well meloxicam daily with food #15 - advised not to take any advil while taking this  Call if no improvement or with any questions.

## 2019-07-23 ENCOUNTER — Other Ambulatory Visit: Payer: Self-pay

## 2019-07-23 ENCOUNTER — Ambulatory Visit (INDEPENDENT_AMBULATORY_CARE_PROVIDER_SITE_OTHER): Payer: Medicare HMO | Admitting: Internal Medicine

## 2019-07-23 ENCOUNTER — Encounter: Payer: Self-pay | Admitting: Internal Medicine

## 2019-07-23 DIAGNOSIS — R739 Hyperglycemia, unspecified: Secondary | ICD-10-CM

## 2019-07-23 DIAGNOSIS — I10 Essential (primary) hypertension: Secondary | ICD-10-CM

## 2019-07-23 DIAGNOSIS — M5416 Radiculopathy, lumbar region: Secondary | ICD-10-CM | POA: Diagnosis not present

## 2019-07-23 MED ORDER — GABAPENTIN 100 MG PO CAPS: 100.0000 mg | ORAL_CAPSULE | Freq: Three times a day (TID) | ORAL | 3 refills | Status: DC

## 2019-07-23 MED ORDER — PREDNISONE 10 MG PO TABS: ORAL_TABLET | ORAL | 0 refills | Status: DC

## 2019-07-23 MED ORDER — TRAMADOL HCL 50 MG PO TABS: 50.0000 mg | ORAL_TABLET | Freq: Four times a day (QID) | ORAL | 0 refills | Status: DC | PRN

## 2019-07-23 NOTE — Patient Instructions (Signed)
Please take all new medication as prescribed  - the tramadol for pain as needed, prednisone and gabapentin as prescribed  Please continue all other medications as before, and refills have been done if requested.  Please have the pharmacy call with any other refills you may need.  Please continue your efforts at being more active, low cholesterol diet, and weight control.  Please keep your appointments with your specialists as you may have planned

## 2019-07-23 NOTE — Assessment & Plan Note (Signed)
stable overall by history and exam, recent data reviewed with pt, and pt to continue medical treatment as before,  to f/u any worsening symptoms or concerns  

## 2019-07-23 NOTE — Progress Notes (Signed)
Subjective:    Patient ID: Christopher Mcfarland, male    DOB: [birth year], 73 y.o.   MRN: [id number]  HPI  Here with 1 wk onset right leg pain, acute onset from right buttock area with radation to the right foot, burning, constant, moderate, maybe some better with lying in a recliner or standing and better with heat pad, but nsaid and muscle relaxer from last visit no help, and worse every time he tries to stand.  Has some vague numbness but no weakness, gait change or falls.  No fever,  Pt denies chest pain, increased sob or doe, wheezing, orthopnea, PND, increased LE swelling, palpitations, dizziness or syncope.  No prior LS spine dz or imaging or surgury.  Pt denies other new neurological symptoms.   Pt denies polydipsia, polyuria,  Past Medical History:  Diagnosis Date  . Essential hypertension   . Hepatitis C 2003   Treated  . Hyperlipidemia    Past Surgical History:  Procedure Laterality Date  . CARPAL TUNNEL RELEASE     bilat 1999  . HEMORRHOID SURGERY     1999/4 polyps removed rectum  . TRIGGER FINGER RELEASE     right hand 1999    reports that he has never smoked. He has never used smokeless tobacco. He reports current alcohol use of about 2.0 - 3.0 standard drinks of alcohol per week. He reports that he does not use drugs. family history includes Bone cancer in his father; CAD (age of onset: 40) in his brother; Kidney cancer in his mother. No Known Allergies Current Outpatient Medications on File Prior to Visit  Medication Sig Dispense Refill  . amLODipine (NORVASC) 5 MG tablet TAKE 1 TABLET(5 MG) BY MOUTH DAILY 90 tablet 2  . docusate sodium (COLACE) 100 MG capsule Take 1 capsule (100 mg total) by mouth every 12 (twelve) hours. 60 capsule 0  . meloxicam (MOBIC) 7.5 MG tablet Take 1 tablet (7.5 mg total) by mouth daily. 15 tablet 0  . methocarbamol (ROBAXIN-750) 750 MG tablet Take 1 tablet (750 mg total) by mouth every 6 (six) hours as needed for muscle spasms. 30 tablet 0  .  polyethylene glycol (MIRALAX / GLYCOLAX) packet Take 17 g by mouth daily. 14 each 0  . rosuvastatin (CRESTOR) 10 MG tablet TAKE 1 TABLET BY MOUTH EVERY DAY 90 tablet 2  . sildenafil (VIAGRA) 100 MG tablet Take 0.5-1 tablets (50-100 mg total) by mouth daily as needed for erectile dysfunction. 5 tablet 11  . Vitamin D, Ergocalciferol, (DRISDOL) 1.25 MG (50000 UT) CAPS capsule Take 1 capsule (50,000 Units total) by mouth every 7 (seven) days. 12 capsule 0   No current facility-administered medications on file prior to visit.    Review of Systems  Constitutional: Negative for other unusual diaphoresis or sweats HENT: Negative for ear discharge or swelling Eyes: Negative for other worsening visual disturbances Respiratory: Negative for stridor or other swelling  Gastrointestinal: Negative for worsening distension or other blood Genitourinary: Negative for retention or other urinary change Musculoskeletal: Negative for other MSK pain or swelling Skin: Negative for color change or other new lesions Neurological: Negative for worsening tremors and other numbness  Psychiatric/Behavioral: Negative for worsening agitation or other fatigue All otherwise neg per pt     Objective:   Physical Exam BP 118/82   Pulse 95   Temp 98.4 F (36.9 C) (Oral)   Ht 5\' 7"  (1.702 m)   Wt 203 lb (92.1 kg)   SpO2 96%  BMI 31.79 kg/m  VS noted,  Constitutional: Pt appears in NAD HENT: Head: NCAT.  Right Ear: External ear normal.  Left Ear: External ear normal.  Eyes: . Pupils are equal, round, and reactive to light. Conjunctivae and EOM are normal Nose: without d/c or deformity Neck: Neck supple. Gross normal ROM Cardiovascular: Normal rate and regular rhythm.   Pulmonary/Chest: Effort normal and breath sounds without rales or wheezing.  Abd:  Soft, NT, ND, + BS, no organomegaly Neurological: Pt is alert. At baseline orientation, motor grossly intact Skin: Skin is warm. No rashes, other new lesions, no  LE edema Psychiatric: Pt behavior is normal without agitation  All otherwise neg per pt Lab Results  Component Value Date   WBC 5.6 03/07/2019   HGB 13.4 03/07/2019   HCT 40.6 03/07/2019   PLT 186.0 03/07/2019   GLUCOSE 88 03/07/2019   CHOL 167 03/07/2019   TRIG 70.0 03/07/2019   HDL 47.60 03/07/2019   LDLCALC 105 (H) 03/07/2019   ALT 21 03/07/2019   AST 21 03/07/2019   NA 142 03/07/2019   K 4.1 03/07/2019   CL 105 03/07/2019   CREATININE 1.29 03/07/2019   BUN 18 03/07/2019   CO2 27 03/07/2019   TSH 1.18 03/07/2019   PSA 0.55 03/07/2019   INR 1.17 08/20/2015   HGBA1C 6.0 03/07/2019      Assessment & Plan:

## 2019-07-23 NOTE — Assessment & Plan Note (Signed)
New onset, no prior hx, no prior imaging, current tx no help; for tramadol prn, predpac asd, and gabapentin 100 tid, consider MRI if not improving in 2-3 wks

## 2019-07-28 ENCOUNTER — Emergency Department (HOSPITAL_BASED_OUTPATIENT_CLINIC_OR_DEPARTMENT_OTHER): Payer: Medicare HMO

## 2019-07-28 ENCOUNTER — Other Ambulatory Visit: Payer: Self-pay

## 2019-07-28 ENCOUNTER — Encounter (HOSPITAL_BASED_OUTPATIENT_CLINIC_OR_DEPARTMENT_OTHER): Payer: Self-pay | Admitting: Adult Health

## 2019-07-28 ENCOUNTER — Emergency Department (HOSPITAL_BASED_OUTPATIENT_CLINIC_OR_DEPARTMENT_OTHER)
Admission: EM | Admit: 2019-07-28 | Discharge: 2019-07-28 | Disposition: A | Discharge status: Home / Self Care | Payer: Medicare HMO | Attending: Emergency Medicine | Admitting: Emergency Medicine

## 2019-07-28 DIAGNOSIS — M79604 Pain in right leg: Secondary | ICD-10-CM | POA: Diagnosis present

## 2019-07-28 DIAGNOSIS — I10 Essential (primary) hypertension: Secondary | ICD-10-CM | POA: Insufficient documentation

## 2019-07-28 DIAGNOSIS — M25551 Pain in right hip: Secondary | ICD-10-CM | POA: Insufficient documentation

## 2019-07-28 DIAGNOSIS — E785 Hyperlipidemia, unspecified: Secondary | ICD-10-CM | POA: Insufficient documentation

## 2019-07-28 DIAGNOSIS — Z79899 Other long term (current) drug therapy: Secondary | ICD-10-CM | POA: Insufficient documentation

## 2019-07-28 DIAGNOSIS — M1611 Unilateral primary osteoarthritis, right hip: Secondary | ICD-10-CM | POA: Diagnosis not present

## 2019-07-28 DIAGNOSIS — M5416 Radiculopathy, lumbar region: Secondary | ICD-10-CM | POA: Diagnosis not present

## 2019-07-28 DIAGNOSIS — M47816 Spondylosis without myelopathy or radiculopathy, lumbar region: Secondary | ICD-10-CM | POA: Diagnosis not present

## 2019-07-28 DIAGNOSIS — M5410 Radiculopathy, site unspecified: Secondary | ICD-10-CM

## 2019-07-28 NOTE — ED Provider Notes (Signed)
[facility] EMERGENCY DEPARTMENT Provider Note   CSN: [id number] Arrival date & time: 07/28/19  1343     History   Chief Complaint Chief Complaint  Patient presents with  . Leg Pain    HPI Christopher Mcfarland is a 73 y.o. male with past medical history of hypertension, hepatitis C, presenting to the emergency department with persistent pain to the right hip radiating down the right leg for about 2 weeks.  Symptoms initially had gradual onset with radiation of pain starting in the groin wrapping around his leg towards the posterior leg down to his foot.  He was evaluated by his PCP on 07/23/2019 diagnosed with right lumbar radiculitis.  He was prescribed tramadol, gabapentin, and a prednisone taper which he reports is not providing him relief of his symptoms.  He denies any injury.  Denies numbness or weakness in extremities, bowel or bladder incontinence, saddle paresthesia, abdominal pain, urinary symptoms, fever.  He states his PCP was planning to order him an outpatient MRI this week.     The history is provided by the patient and medical records.    Past Medical History:  Diagnosis Date  . Essential hypertension   . Hepatitis C 2003   Treated  . Hyperlipidemia     Patient Active Problem List   Diagnosis Date Noted  . Right lumbar radiculitis 07/23/2019  . Neck pain, musculoskeletal 06/27/2019  . Erectile dysfunction 03/11/2019  . Vitamin D deficiency 03/11/2019  . Hyperglycemia 03/07/2019  . Encounter for well adult exam with abnormal findings 11/22/2016  . Medicare annual wellness visit, initial 11/22/2016  . Osteoarthritis of both hands 09/02/2016  . Essential hypertension 09/02/2016  . Hyperlipidemia 09/02/2016  . Liver mass 01/08/2016  . Liver fibrosis 01/08/2016  . NEUTROPENIA UNSPECIFIED 09/01/2009  . COCAINE ABUSE 09/01/2009  . Chronic hepatitis C without hepatic coma (HCC) 08/15/2009  . TRIGGER FINGER 08/15/2009  . URINALYSIS, ABNORMAL 08/15/2009   . COLONIC POLYPS, HX OF 08/15/2009    Past Surgical History:  Procedure Laterality Date  . CARPAL TUNNEL RELEASE     bilat 1999  . HEMORRHOID SURGERY     1999/4 polyps removed rectum  . TRIGGER FINGER RELEASE     right hand 1999        Home Medications    Prior to Admission medications   Medication Sig Start Date End Date Taking? Authorizing Provider  amLODipine (NORVASC) 5 MG tablet TAKE 1 TABLET(5 MG) BY MOUTH DAILY 04/03/19   Corwin Levins, MD  docusate sodium (COLACE) 100 MG capsule Take 1 capsule (100 mg total) by mouth every 12 (twelve) hours. 07/30/18   Arby Barrette, MD  gabapentin (NEURONTIN) 100 MG capsule Take 1 capsule (100 mg total) by mouth 3 (three) times daily. 07/23/19   Corwin Levins, MD  meloxicam (MOBIC) 7.5 MG tablet Take 1 tablet (7.5 mg total) by mouth daily. 06/27/19   Pincus Sanes, MD  methocarbamol (ROBAXIN-750) 750 MG tablet Take 1 tablet (750 mg total) by mouth every 6 (six) hours as needed for muscle spasms. 06/27/19   Pincus Sanes, MD  polyethylene glycol (MIRALAX / GLYCOLAX) packet Take 17 g by mouth daily. 07/30/18   Arby Barrette, MD  predniSONE (DELTASONE) 10 MG tablet 4tab by mouth x 3day,3tabs x 3day,2tab x 3day,1 tab x 3 day 07/23/19   Corwin Levins, MD  rosuvastatin (CRESTOR) 10 MG tablet TAKE 1 TABLET BY MOUTH EVERY DAY 04/03/19   Corwin Levins, MD  sildenafil (  VIAGRA) 100 MG tablet Take 0.5-1 tablets (50-100 mg total) by mouth daily as needed for erectile dysfunction. 03/07/19   Biagio Borg, MD  traMADol (ULTRAM) 50 MG tablet Take 1 tablet (50 mg total) by mouth every 6 (six) hours as needed. 07/23/19   Biagio Borg, MD  Vitamin D, Ergocalciferol, (DRISDOL) 1.25 MG (50000 UT) CAPS capsule Take 1 capsule (50,000 Units total) by mouth every 7 (seven) days. 03/08/19   Biagio Borg, MD    Family History Family History  Problem Relation Age of Onset  . CAD Brother 12  . Kidney cancer Mother   . Bone cancer Father   . Colon cancer Neg Hx    . Pancreatic cancer Neg Hx   . Rectal cancer Neg Hx   . Stomach cancer Neg Hx     Social History Social History   Tobacco Use  . Smoking status: Never Smoker  . Smokeless tobacco: Never Used  Substance Use Topics  . Alcohol use: Yes    Alcohol/week: 2.0 - 3.0 standard drinks    Types: 2 - 3 Shots of liquor per week  . Drug use: No    Comment: smoked marijuana "years ago"     Allergies   Patient has no known allergies.   Review of Systems Review of Systems  All other systems reviewed and are negative.    Physical Exam Updated Vital Signs BP (!) 137/107 (BP Location: Right Arm)   Pulse 86   Temp 99 F (37.2 C) (Oral)   Resp 18   Ht 5\' 8"  (1.727 m)   Wt 92.1 kg   SpO2 98%   BMI 30.87 kg/m   Physical Exam Vitals signs and nursing note reviewed.  Constitutional:      General: He is not in acute distress.    Appearance: He is well-developed. He is not ill-appearing.  HENT:     Head: Normocephalic and atraumatic.  Eyes:     Conjunctiva/sclera: Conjunctivae normal.  Cardiovascular:     Rate and Rhythm: Normal rate and regular rhythm.  Pulmonary:     Effort: Pulmonary effort is normal.     Breath sounds: Normal breath sounds.  Abdominal:     General: Bowel sounds are normal.     Palpations: Abdomen is soft.     Tenderness: There is no abdominal tenderness.  Musculoskeletal:     Comments: No tenderness is present with palpation of the low back, gluteal region or hip.  Skin:    General: Skin is warm.  Neurological:     Mental Status: He is alert.     Comments: Normal tone.  5/5 strength in BUE and BLE including strong and equal grip strength and dorsiflexion/plantar flexion Sensory: Pinprick and light touch normal in BLE extremities.  Gait: normal gait and balance CV: distal pulses palpable throughout    Psychiatric:        Behavior: Behavior normal.      ED Treatments / Results  Labs (all labs ordered are listed, but only abnormal results are  displayed) Labs Reviewed - No data to display  EKG None  Radiology Dg Lumbar Spine Complete  Result Date: 07/28/2019 CLINICAL DATA:  Right hip pain extending down the leg EXAM: LUMBAR SPINE - COMPLETE 4+ VIEW COMPARISON:  None. FINDINGS: Lumbar alignment within normal limits. Vertebral body heights are maintained. Aortic atherosclerosis. Mild disc space narrowing at L2-L3 and L3-L4 with mild endplate sclerosis and osteophyte. Moderate to advanced degenerative change at L4-L5 and  L5-S1. Facet degenerative changes of the lower lumbar spine. IMPRESSION: 1. Diffuse degenerative changes of the lumbar spine, most marked at L4-L5 and L5-S1. 2. No definite acute osseous abnormality. Electronically Signed   By: Jasmine PangKim  Fujinaga M.D.   On: 07/28/2019 16:23   Dg Hip Unilat With Pelvis 2-3 Views Right  Result Date: 07/28/2019 CLINICAL DATA:  Right hip pain EXAM: DG HIP (WITH OR WITHOUT PELVIS) 2-3V RIGHT COMPARISON:  None. FINDINGS: SI joints are patent. Pubic symphysis and rami are intact. No fracture or malalignment. Mild degenerative changes of the right hip with minimal spurring at the femoral head neck junction and mild joint space narrowing. IMPRESSION: Mild degenerative changes.  No acute osseous abnormality. Electronically Signed   By: Jasmine PangKim  Fujinaga M.D.   On: 07/28/2019 16:24    Procedures Procedures (including critical care time)  Medications Ordered in ED Medications - No data to display   Initial Impression / Assessment and Plan / ED Course  I have reviewed the triage vital signs and the nursing notes.  Pertinent labs & imaging results that were available during my care of the patient were reviewed by me and considered in my medical decision making (see chart for details).        Patient presenting less with 2 weeks of right-sided leg pain, consistent with a radiculopathy.  He is having no concerning symptoms for cauda equina or significant spinal cord injury.  Normal neurologic exam.   Patient is able to ambulate though states it is painful.  Imaging of L-spine and hip reveal degenerative changes.  Discussed with patient that MRI was not indicated at this time in the emergency department though encouraged to follow-up closely with his primary care provider.  Pt prescribed meloxicam last month, he states he has many left over. Encouraged he begin taking that with his already prescribed tramadol, gabapentin and prednisone.  Encourage symptomatic management and strict return precautions.  Patient verbalized understanding agrees with care plan.  Patient discussed with Dr. Jeraldine LootsLockwood.  Discussed results, findings, treatment and follow up. Patient advised of return precautions. Patient verbalized understanding and agreed with plan.  Final Clinical Impressions(s) / ED Diagnoses   Final diagnoses:  Radicular pain of right lower extremity    ED Discharge Orders    None       Ouita Nish, [place] N, PA-C 07/28/19 1709    Gerhard MunchLockwood, Robert, MD 07/28/19 2323

## 2019-07-28 NOTE — ED Notes (Signed)
Pt reports recent diagnosis of pinched nerve, reports scripts are not helping with pain

## 2019-07-28 NOTE — ED Notes (Signed)
Pt verbalized understanding of dc instructions.

## 2019-07-28 NOTE — Discharge Instructions (Addendum)
Take your medications as prescribed for pain. You can apply heat if this provides additional relief. Follow close with your primary care provider regarding her ongoing pain. Return to the emergency department if you develop problems holding your bowels or emptying your bladder, weakness in your legs, or fever.

## 2019-07-28 NOTE — ED Triage Notes (Signed)
Presents with 3 days of right leg pain that begins in the right hip and goes to the posterior aspect of the leg. Movement and standing makes the pain worse. He was seen by his PMD and told he had a pinched nerve. The medication he was given is not helping.

## 2019-08-13 ENCOUNTER — Other Ambulatory Visit (INDEPENDENT_AMBULATORY_CARE_PROVIDER_SITE_OTHER): Payer: Medicare HMO

## 2019-08-13 ENCOUNTER — Ambulatory Visit (INDEPENDENT_AMBULATORY_CARE_PROVIDER_SITE_OTHER): Payer: Medicare HMO | Admitting: Family

## 2019-08-13 ENCOUNTER — Ambulatory Visit (INDEPENDENT_AMBULATORY_CARE_PROVIDER_SITE_OTHER)
Admission: RE | Admit: 2019-08-13 | Discharge: 2019-08-13 | Disposition: A | Discharge status: Home / Self Care | Payer: Medicare HMO | Source: Ambulatory Visit | Attending: Family | Admitting: Family

## 2019-08-13 ENCOUNTER — Telehealth: Payer: Self-pay | Admitting: Internal Medicine

## 2019-08-13 ENCOUNTER — Encounter: Payer: Self-pay | Admitting: Family

## 2019-08-13 ENCOUNTER — Other Ambulatory Visit: Payer: Self-pay

## 2019-08-13 ENCOUNTER — Other Ambulatory Visit: Payer: Self-pay | Admitting: Family

## 2019-08-13 VITALS — BP 148/84 | HR 98 | Temp 98.4°F | Ht 68.0 in | Wt 204.0 lb

## 2019-08-13 DIAGNOSIS — M7989 Other specified soft tissue disorders: Secondary | ICD-10-CM | POA: Diagnosis not present

## 2019-08-13 DIAGNOSIS — M5416 Radiculopathy, lumbar region: Secondary | ICD-10-CM

## 2019-08-13 DIAGNOSIS — M79644 Pain in right finger(s): Secondary | ICD-10-CM

## 2019-08-13 DIAGNOSIS — M79641 Pain in right hand: Secondary | ICD-10-CM | POA: Diagnosis not present

## 2019-08-13 LAB — CBC WITH DIFFERENTIAL/PLATELET
Basophils Absolute: 0.1 10*3/uL (ref 0.0–0.1)
Basophils Relative: 1.2 % (ref 0.0–3.0)
Eosinophils Absolute: 0.1 10*3/uL (ref 0.0–0.7)
Eosinophils Relative: 2.4 % (ref 0.0–5.0)
HCT: 39.2 % (ref 39.0–52.0)
Hemoglobin: 13.2 g/dL (ref 13.0–17.0)
Lymphocytes Relative: 38.1 % (ref 12.0–46.0)
Lymphs Abs: 1.9 10*3/uL (ref 0.7–4.0)
MCHC: 33.6 g/dL (ref 30.0–36.0)
MCV: 87.1 fl (ref 78.0–100.0)
Monocytes Absolute: 0.6 10*3/uL (ref 0.1–1.0)
Monocytes Relative: 12.2 % — ABNORMAL HIGH (ref 3.0–12.0)
Neutro Abs: 2.3 10*3/uL (ref 1.4–7.7)
Neutrophils Relative %: 46.1 % (ref 43.0–77.0)
Platelets: 158 10*3/uL (ref 150.0–400.0)
RBC: 4.5 Mil/uL (ref 4.22–5.81)
RDW: 14.3 % (ref 11.5–15.5)
WBC: 5 10*3/uL (ref 4.0–10.5)

## 2019-08-13 LAB — COMPREHENSIVE METABOLIC PANEL
ALT: 23 U/L (ref 0–53)
AST: 16 U/L (ref 0–37)
Albumin: 4.2 g/dL (ref 3.5–5.2)
Alkaline Phosphatase: 45 U/L (ref 39–117)
BUN: 20 mg/dL (ref 6–23)
CO2: 25 mEq/L (ref 19–32)
Calcium: 9.7 mg/dL (ref 8.4–10.5)
Chloride: 102 mEq/L (ref 96–112)
Creatinine, Ser: 1.15 mg/dL (ref 0.40–1.50)
GFR: 75.7 mL/min (ref >60.00)
Glucose, Bld: 110 mg/dL — ABNORMAL HIGH (ref 70–99)
Potassium: 4.2 mEq/L (ref 3.5–5.1)
Sodium: 135 mEq/L (ref 135–145)
Total Bilirubin: 0.5 mg/dL (ref 0.2–1.2)
Total Protein: 7.7 g/dL (ref 6.0–8.3)

## 2019-08-13 LAB — URIC ACID: Uric Acid, Serum: 7.9 mg/dL — ABNORMAL HIGH (ref 4.0–7.8)

## 2019-08-13 MED ORDER — TRAMADOL HCL 50 MG PO TABS: 50.0000 mg | ORAL_TABLET | Freq: Four times a day (QID) | ORAL | 2 refills | Status: DC | PRN

## 2019-08-13 MED ORDER — PREDNISONE 20 MG PO TABS: 40.0000 mg | ORAL_TABLET | Freq: Every day | ORAL | 0 refills | Status: DC

## 2019-08-13 NOTE — Progress Notes (Signed)
Christopher Mcfarland is a 73 y.o. male with the following history as recorded in [employer]:  Patient Active Problem List   Diagnosis Date Noted  . Right lumbar radiculitis 07/23/2019  . Neck pain, musculoskeletal 06/27/2019  . Erectile dysfunction 03/11/2019  . Vitamin D deficiency 03/11/2019  . Hyperglycemia 03/07/2019  . Encounter for well adult exam with abnormal findings 11/22/2016  . Medicare annual wellness visit, initial 11/22/2016  . Osteoarthritis of both hands 09/02/2016  . Essential hypertension 09/02/2016  . Hyperlipidemia 09/02/2016  . Liver mass 01/08/2016  . Liver fibrosis 01/08/2016  . NEUTROPENIA UNSPECIFIED 09/01/2009  . COCAINE ABUSE 09/01/2009  . Chronic hepatitis C without hepatic coma ([facility]) 08/15/2009  . TRIGGER FINGER 08/15/2009  . URINALYSIS, ABNORMAL 08/15/2009  . COLONIC POLYPS, HX OF 08/15/2009    Current Outpatient Medications  Medication Sig Dispense Refill  . amLODipine (NORVASC) 5 MG tablet TAKE 1 TABLET(5 MG) BY MOUTH DAILY 90 tablet 2  . docusate sodium (COLACE) 100 MG capsule Take 1 capsule (100 mg total) by mouth every 12 (twelve) hours. 60 capsule 0  . esomeprazole (NEXIUM) 40 MG capsule Take by mouth.    . gabapentin (NEURONTIN) 100 MG capsule Take 1 capsule (100 mg total) by mouth 3 (three) times daily. 90 capsule 3  . ipratropium (ATROVENT) 0.06 % nasal spray Place 2 sprays into both nostrils 3 (three) times daily.    . meloxicam (MOBIC) 7.5 MG tablet Take 1 tablet (7.5 mg total) by mouth daily. 15 tablet 0  . methocarbamol (ROBAXIN-750) 750 MG tablet Take 1 tablet (750 mg total) by mouth every 6 (six) hours as needed for muscle spasms. 30 tablet 0  . polyethylene glycol (MIRALAX / GLYCOLAX) packet Take 17 g by mouth daily. 14 each 0  . rosuvastatin (CRESTOR) 10 MG tablet TAKE 1 TABLET BY MOUTH EVERY DAY 90 tablet 2  . sildenafil (VIAGRA) 100 MG tablet Take 0.5-1 tablets (50-100 mg total) by mouth daily as needed for erectile dysfunction. 5 tablet  11  . traMADol (ULTRAM) 50 MG tablet Take 1 tablet (50 mg total) by mouth every 6 (six) hours as needed. 30 tablet 0  . Vitamin D, Ergocalciferol, (DRISDOL) 1.25 MG (50000 UT) CAPS capsule Take 1 capsule (50,000 Units total) by mouth every 7 (seven) days. 12 capsule 0  . predniSONE (DELTASONE) 20 MG tablet Take 2 tablets (40 mg total) by mouth daily with breakfast. 10 tablet 0   No current facility-administered medications for this visit.     Allergies: Patient has no known allergies.  Past Medical History:  Diagnosis Date  . Essential hypertension   . Hepatitis C 2003   Treated  . Hyperlipidemia     Past Surgical History:  Procedure Laterality Date  . CARPAL TUNNEL RELEASE     bilat 1999  . HEMORRHOID SURGERY     1999/4 polyps removed rectum  . TRIGGER FINGER RELEASE     right hand 1999    Family History  Problem Relation Age of Onset  . CAD Brother 52  . Kidney cancer Mother   . Bone cancer Father   . Colon cancer Neg Hx   . Pancreatic cancer Neg Hx   . Rectal cancer Neg Hx   . Stomach cancer Neg Hx     Social History   Tobacco Use  . Smoking status: Never Smoker  . Smokeless tobacco: Never Used  Substance Use Topics  . Alcohol use: Yes    Alcohol/week: 2.0 - 3.0 standard drinks  Types: 2 - 3 Shots of liquor per week    Subjective:  2 concerns:  1) Continued low back pain- was seen here by his PCP on 11/9 and at the ER on 11/14; initially responded to Prednisone, Tramadol and Gabapentin; did not understand he was to call his PCP to get MRI scheduled if symptoms persist.  2) Right thumb pain x 2 days; history of gout flare; suspects this is gout flare- did eat increased pork over the holiday;       Objective:  Vitals:   08/13/19 1006  BP: (!) 148/84  Pulse: 98  Temp: 98.4 F (36.9 C)  TempSrc: Oral  SpO2: 98%  Weight: 204 lb (92.5 kg)  Height: _0  (1.727 m)    General: Well developed, well nourished, in no acute distress  Skin : Warm and dry.   Head: Normocephalic and atraumatic  Lungs: Respirations unlabored;  Musculoskeletal: No deformities; marked swelling of right thumb; limited range of motion; Extremities: No edema, cyanosis, clubbing  Vessels: Symmetric bilaterally  Neurologic: Alert and oriented; speech intact; face symmetrical; moves all extremities well; CNII-XII intact without focal deficit   Assessment:  1. Pain of right thumb   2. Right lumbar radiculitis     Plan:  1. Suspect gout flare; check labs and X-ray; Rx for Prednisone 40 mg qd x 5 days; encouraged to try cherry juice, drink water;  2. Update lumbar MRI; will have patient follow-up with his PCP;   This visit occurred during the SARS-CoV-2 public health emergency.  Safety protocols were in place, including screening questions prior to the visit, additional usage of staff PPE, and extensive cleaning of exam room while observing appropriate contact time as indicated for disinfecting solutions.    No follow-ups on file.  Orders Placed This Encounter  Procedures  . DG Hand Complete Right    Standing Status:   Future    Number of Occurrences:   1    Standing Expiration Date:   10/12/2020    Order Specific Question:   Reason for Exam (SYMPTOM  OR DIAGNOSIS REQUIRED)    Answer:   right thumb pain    Order Specific Question:   Preferred imaging location?    Answer:   [facility]    Order Specific Question:   Radiology Contrast Protocol - do NOT remove file path    Answer:   [email or web address]  . CBC w/Diff    Standing Status:   Future    Number of Occurrences:   1    Standing Expiration Date:   08/12/2020  . Comp Met (CMET)    Standing Status:   Future    Number of Occurrences:   1    Standing Expiration Date:   08/12/2020  . Uric acid    Standing Status:   Future    Number of Occurrences:   1    Standing Expiration Date:   08/12/2020    Requested Prescriptions    No prescriptions requested or ordered  in this encounter

## 2019-08-13 NOTE — Telephone Encounter (Signed)
Done erx 

## 2019-08-16 ENCOUNTER — Telehealth: Payer: Self-pay | Admitting: Internal Medicine

## 2019-08-16 NOTE — Telephone Encounter (Signed)
Patient requesting predniSONE (DELTASONE) 20 MG tablet stating right thumb pain has not improved seeking a refill has 2 pills left,please advise  [name], Jackson Saline

## 2019-08-16 NOTE — Telephone Encounter (Signed)
Ok to finish the current prednisone before needs refill, thanks

## 2019-08-17 MED ORDER — PREDNISONE 20 MG PO TABS: 40.0000 mg | ORAL_TABLET | Freq: Every day | ORAL | 0 refills | Status: DC

## 2019-08-24 ENCOUNTER — Other Ambulatory Visit: Payer: Self-pay

## 2019-08-24 ENCOUNTER — Ambulatory Visit
Admission: RE | Admit: 2019-08-24 | Discharge: 2019-08-24 | Disposition: A | Discharge status: Home / Self Care | Payer: Medicare HMO | Source: Ambulatory Visit | Attending: Family | Admitting: Family

## 2019-08-24 DIAGNOSIS — M48061 Spinal stenosis, lumbar region without neurogenic claudication: Secondary | ICD-10-CM | POA: Diagnosis not present

## 2019-08-24 DIAGNOSIS — M5416 Radiculopathy, lumbar region: Secondary | ICD-10-CM

## 2019-08-27 ENCOUNTER — Other Ambulatory Visit: Payer: Self-pay | Admitting: Family

## 2019-08-27 DIAGNOSIS — M5416 Radiculopathy, lumbar region: Secondary | ICD-10-CM

## 2019-08-29 ENCOUNTER — Telehealth: Payer: Self-pay

## 2019-08-29 NOTE — Telephone Encounter (Signed)
Copied from [place] [id number]. Topic: General - Inquiry >> Aug 29, 2019 11:28 AM Reyne Dumas L wrote: Reason for CRM:   Pt calling to get results of CT scan.

## 2019-08-30 ENCOUNTER — Telehealth: Payer: Self-pay | Admitting: Family

## 2019-08-30 NOTE — Telephone Encounter (Signed)
Patient has called back in regard to MRI results.  I have given Mr. Christopher Mcfarland's response in regard.  Patient understood.

## 2019-08-30 NOTE — Telephone Encounter (Signed)
Called patient and left message for him to return call again to clinic to go over MRI results.

## 2019-09-17 DIAGNOSIS — M5416 Radiculopathy, lumbar region: Secondary | ICD-10-CM | POA: Diagnosis not present

## 2019-09-20 DIAGNOSIS — M48062 Spinal stenosis, lumbar region with neurogenic claudication: Secondary | ICD-10-CM | POA: Diagnosis not present

## 2019-09-20 DIAGNOSIS — M5416 Radiculopathy, lumbar region: Secondary | ICD-10-CM | POA: Diagnosis not present

## 2019-10-03 DIAGNOSIS — M48062 Spinal stenosis, lumbar region with neurogenic claudication: Secondary | ICD-10-CM | POA: Diagnosis not present

## 2019-10-03 DIAGNOSIS — M5416 Radiculopathy, lumbar region: Secondary | ICD-10-CM | POA: Diagnosis not present

## 2019-10-26 DIAGNOSIS — M5416 Radiculopathy, lumbar region: Secondary | ICD-10-CM | POA: Diagnosis not present

## 2019-10-26 DIAGNOSIS — M48062 Spinal stenosis, lumbar region with neurogenic claudication: Secondary | ICD-10-CM | POA: Diagnosis not present

## 2019-11-13 DIAGNOSIS — J300 Vasomotor rhinitis: Secondary | ICD-10-CM | POA: Diagnosis not present

## 2019-11-13 DIAGNOSIS — R040 Epistaxis: Secondary | ICD-10-CM | POA: Diagnosis not present

## 2019-11-21 DIAGNOSIS — M5416 Radiculopathy, lumbar region: Secondary | ICD-10-CM | POA: Diagnosis not present

## 2019-11-21 DIAGNOSIS — M48062 Spinal stenosis, lumbar region with neurogenic claudication: Secondary | ICD-10-CM | POA: Diagnosis not present

## 2019-12-18 ENCOUNTER — Other Ambulatory Visit: Payer: Self-pay | Admitting: Internal Medicine

## 2019-12-18 DIAGNOSIS — I10 Essential (primary) hypertension: Secondary | ICD-10-CM | POA: Diagnosis not present

## 2019-12-18 DIAGNOSIS — M5416 Radiculopathy, lumbar region: Secondary | ICD-10-CM | POA: Diagnosis not present

## 2019-12-18 DIAGNOSIS — M48062 Spinal stenosis, lumbar region with neurogenic claudication: Secondary | ICD-10-CM | POA: Diagnosis not present

## 2019-12-18 DIAGNOSIS — Z6831 Body mass index (BMI) 31.0-31.9, adult: Secondary | ICD-10-CM | POA: Diagnosis not present

## 2019-12-18 MED ORDER — AMLODIPINE BESYLATE 5 MG PO TABS: ORAL_TABLET | ORAL | 0 refills | Status: DC

## 2019-12-18 MED ORDER — ROSUVASTATIN CALCIUM 10 MG PO TABS: ORAL_TABLET | ORAL | 0 refills | Status: DC

## 2019-12-18 NOTE — Telephone Encounter (Signed)
    1.Medication Requested: rosuvastatin (CRESTOR) 10 MG tablet amLODipine (NORVASC) 5 MG tablet  2. Pharmacy (Name, Street, [place]): [facility]  3. On Med List: yes  4. Last Visit with PCP: 08/13/19  5. Next visit date with PCP: 03/07/20   Agent: Please be advised that RX refills may take up to 3 business days. We ask that you follow-up with your pharmacy.

## 2019-12-18 NOTE — Telephone Encounter (Signed)
Per office policy [facility] day [facility] until appt...Raechel Chute

## 2019-12-22 ENCOUNTER — Other Ambulatory Visit: Payer: Self-pay

## 2019-12-22 ENCOUNTER — Encounter (HOSPITAL_BASED_OUTPATIENT_CLINIC_OR_DEPARTMENT_OTHER): Payer: Self-pay | Admitting: Emergency Medicine

## 2019-12-22 ENCOUNTER — Emergency Department (HOSPITAL_BASED_OUTPATIENT_CLINIC_OR_DEPARTMENT_OTHER)
Admission: EM | Admit: 2019-12-22 | Discharge: 2019-12-22 | Disposition: A | Discharge status: Home / Self Care | Payer: Medicare HMO | Attending: Emergency Medicine | Admitting: Emergency Medicine

## 2019-12-22 DIAGNOSIS — W010XXA Fall on same level from slipping, tripping and stumbling without subsequent striking against object, initial encounter: Secondary | ICD-10-CM | POA: Insufficient documentation

## 2019-12-22 DIAGNOSIS — I10 Essential (primary) hypertension: Secondary | ICD-10-CM | POA: Diagnosis not present

## 2019-12-22 DIAGNOSIS — Z79899 Other long term (current) drug therapy: Secondary | ICD-10-CM | POA: Diagnosis not present

## 2019-12-22 DIAGNOSIS — G8929 Other chronic pain: Secondary | ICD-10-CM

## 2019-12-22 DIAGNOSIS — M25462 Effusion, left knee: Secondary | ICD-10-CM

## 2019-12-22 DIAGNOSIS — M25552 Pain in left hip: Secondary | ICD-10-CM | POA: Insufficient documentation

## 2019-12-22 MED ORDER — OXYCODONE HCL 5 MG PO TABS
5.0000 mg | ORAL_TABLET | Freq: Once | ORAL | Status: AC
  Administered 2019-12-22: 08:00:00 5 mg via ORAL
  Filled 2019-12-22: qty 1

## 2019-12-22 MED ORDER — KETOROLAC TROMETHAMINE 15 MG/ML IJ SOLN
15.0000 mg | Freq: Once | INTRAMUSCULAR | Status: AC
  Administered 2019-12-22: 15 mg via INTRAMUSCULAR
  Filled 2019-12-22: qty 1

## 2019-12-22 MED ORDER — ACETAMINOPHEN 500 MG PO TABS
1000.0000 mg | ORAL_TABLET | Freq: Once | ORAL | Status: AC
  Administered 2019-12-22: 08:00:00 1000 mg via ORAL
  Filled 2019-12-22: qty 2

## 2019-12-22 MED ORDER — DICLOFENAC SODIUM 1 % EX GEL: 4.0000 g | Freq: Four times a day (QID) | CUTANEOUS | 0 refills | Status: DC

## 2019-12-22 NOTE — Discharge Instructions (Addendum)
Take tylenol 1000mg (2 extra strength) four times a day.   Use the gel as prescribed.   Follow up with your doctor on Monday.

## 2019-12-22 NOTE — ED Triage Notes (Signed)
Fell on Tuesday while leaving dr office following a back injection. Pain and swelling to L knee

## 2019-12-22 NOTE — ED Provider Notes (Signed)
[facility] EMERGENCY DEPARTMENT Provider Note   CSN: [id number] Arrival date & time: 12/22/19  0736     History Chief Complaint  Patient presents with  . Fall    Christopher Mcfarland is a 74 y.o. male.  74 yo M with a chief complaints of left hip pain.  This been an ongoing issue for him.  Has been getting cortisone injections into the hip with some improvement.  Had his last one done last week.  States that he had gotten up too quickly to meet somebody to give him a ride and he fell due to pain to that left leg.  This was about 5 days ago.  Since that is noticed some mild swelling to his left knee.  Has had mild worsening of his chronic pain.  States he still to take Tylenol only for his pain and that is not helping him with his pain currently.  He denies any new symptoms to the leg.  Had a small abrasion to the anterior leg which he cleaned out at home.  The history is provided by the patient.  Fall This is a new problem. The current episode started more than 2 days ago. The problem occurs rarely. The problem has been resolved. Pertinent negatives include no chest pain, no abdominal pain, no headaches and no shortness of breath. Nothing aggravates the symptoms. Nothing relieves the symptoms. He has tried nothing for the symptoms. The treatment provided no relief.       Past Medical History:  Diagnosis Date  . Essential hypertension   . Hepatitis C 2003   Treated  . Hyperlipidemia     Patient Active Problem List   Diagnosis Date Noted  . Right lumbar radiculitis 07/23/2019  . Neck pain, musculoskeletal 06/27/2019  . Erectile dysfunction 03/11/2019  . Vitamin D deficiency 03/11/2019  . Hyperglycemia 03/07/2019  . Encounter for well adult exam with abnormal findings 11/22/2016  . Medicare annual wellness visit, initial 11/22/2016  . Osteoarthritis of both hands 09/02/2016  . Essential hypertension 09/02/2016  . Hyperlipidemia 09/02/2016  . Liver mass 01/08/2016  .  Liver fibrosis 01/08/2016  . NEUTROPENIA UNSPECIFIED 09/01/2009  . COCAINE ABUSE 09/01/2009  . Chronic hepatitis C without hepatic coma ([place]) 08/15/2009  . TRIGGER FINGER 08/15/2009  . URINALYSIS, ABNORMAL 08/15/2009  . COLONIC POLYPS, HX OF 08/15/2009    Past Surgical History:  Procedure Laterality Date  . CARPAL TUNNEL RELEASE     bilat 1999  . HEMORRHOID SURGERY     1999/4 polyps removed rectum  . TRIGGER FINGER RELEASE     right hand 1999       Family History  Problem Relation Age of Onset  . CAD Brother 60  . Kidney cancer Mother   . Bone cancer Father   . Colon cancer Neg Hx   . Pancreatic cancer Neg Hx   . Rectal cancer Neg Hx   . Stomach cancer Neg Hx     Social History   Tobacco Use  . Smoking status: Never Smoker  . Smokeless tobacco: Never Used  Substance Use Topics  . Alcohol use: Not Currently    Alcohol/week: 2.0 - 3.0 standard drinks    Types: 2 - 3 Shots of liquor per week  . Drug use: No    Comment: smoked marijuana "years ago"    Home Medications Prior to Admission medications   Medication Sig Start Date End Date Taking? Authorizing Provider  amLODipine (NORVASC) 5 MG tablet TAKE 1  TABLET(5 MG) BY MOUTH DAILY Annual appt due in June must see provider for future refills 12/18/19   Corwin Levins, MD  diclofenac Sodium (VOLTAREN) 1 % GEL Apply 4 g topically 4 (four) times daily. 12/22/19   Melene Plan, DO  docusate sodium (COLACE) 100 MG capsule Take 1 capsule (100 mg total) by mouth every 12 (twelve) hours. 07/30/18   Arby Barrette, MD  esomeprazole (NEXIUM) 40 MG capsule Take by mouth. 10/25/17   [provider]  gabapentin (NEURONTIN) 100 MG capsule Take 1 capsule (100 mg total) by mouth 3 (three) times daily. 07/23/19   Corwin Levins, MD  ipratropium (ATROVENT) 0.06 % nasal spray Place 2 sprays into both nostrils 3 (three) times daily. 07/07/19   [provider]  meloxicam (MOBIC) 7.5 MG tablet Take 1 tablet (7.5 mg total) by  mouth daily. 06/27/19   Pincus Sanes, MD  methocarbamol (ROBAXIN-750) 750 MG tablet Take 1 tablet (750 mg total) by mouth every 6 (six) hours as needed for muscle spasms. 06/27/19   Pincus Sanes, MD  polyethylene glycol (MIRALAX / GLYCOLAX) packet Take 17 g by mouth daily. 07/30/18   Arby Barrette, MD  predniSONE (DELTASONE) 20 MG tablet Take 2 tablets (40 mg total) by mouth daily with breakfast. 08/17/19   Corwin Levins, MD  rosuvastatin (CRESTOR) 10 MG tablet TAKE 1 TABLET BY MOUTH EVERY DAY Annual appt due in June must see provider for future refills 12/18/19   Corwin Levins, MD  sildenafil (VIAGRA) 100 MG tablet Take 0.5-1 tablets (50-100 mg total) by mouth daily as needed for erectile dysfunction. 03/07/19   Corwin Levins, MD  traMADol (ULTRAM) 50 MG tablet Take 1 tablet (50 mg total) by mouth every 6 (six) hours as needed. 08/13/19   Corwin Levins, MD  Vitamin D, Ergocalciferol, (DRISDOL) 1.25 MG (50000 UT) CAPS capsule Take 1 capsule (50,000 Units total) by mouth every 7 (seven) days. 03/08/19   Corwin Levins, MD    Allergies    Patient has no known allergies.  Review of Systems   Review of Systems  Constitutional: Negative for chills and fever.  HENT: Negative for congestion and facial swelling.   Eyes: Negative for discharge and visual disturbance.  Respiratory: Negative for shortness of breath.   Cardiovascular: Negative for chest pain and palpitations.  Gastrointestinal: Negative for abdominal pain, diarrhea and vomiting.  Musculoskeletal: Positive for arthralgias, back pain and myalgias.  Skin: Negative for color change and rash.  Neurological: Negative for tremors, syncope and headaches.  Psychiatric/Behavioral: Negative for confusion and dysphoric mood.    Physical Exam Updated Vital Signs BP (!) 158/110 (BP Location: Right Arm)   Pulse (!) 116   Temp 99.8 F (37.7 C) (Oral)   Resp 20   Ht 5\' 8"  (1.727 m)   Wt 92.5 kg   SpO2 100%   BMI 31.02 kg/m   Physical  Exam Vitals and nursing note reviewed.  Constitutional:      Appearance: He is well-developed.  HENT:     Head: Normocephalic and atraumatic.  Eyes:     Pupils: Pupils are equal, round, and reactive to light.  Neck:     Vascular: No JVD.  Cardiovascular:     Rate and Rhythm: Normal rate and regular rhythm.     Heart sounds: No murmur. No friction rub. No gallop.   Pulmonary:     Effort: No respiratory distress.     Breath sounds: No wheezing.  Abdominal:     General: There is no distension.     Tenderness: There is no guarding or rebound.  Musculoskeletal:        General: Normal range of motion.     Cervical back: Normal range of motion and neck supple.     Comments: Pulse motor and sensation are intact to the left lower extremity.  He has a small abrasion overlying the distal tibia.  Appears to be chronic in nature.  No surrounding erythema edema or significant tenderness.  There is a mild effusion to the left knee.  I am able to passively range this without significant tenderness.  He has mild pain with internal and external rotation of the left lower extremity.  No midline L-spine tenderness.  Ambulatory.  Skin:    Coloration: Skin is not pale.     Findings: No rash.  Neurological:     Mental Status: He is alert and oriented to person, place, and time.  Psychiatric:        Behavior: Behavior normal.     ED Results / Procedures / Treatments   Labs (all labs ordered are listed, but only abnormal results are displayed) Labs Reviewed - No data to display  EKG None  Radiology No results found.  Procedures Procedures (including critical care time)  Medications Ordered in ED Medications  acetaminophen (TYLENOL) tablet 1,000 mg (1,000 mg Oral Given 12/22/19 0756)  ketorolac (TORADOL) 15 MG/ML injection 15 mg (15 mg Intramuscular Given 12/22/19 0757)  oxyCODONE (Oxy IR/ROXICODONE) immediate release tablet 5 mg (5 mg Oral Given 12/22/19 0756)    ED Course  I have reviewed  the triage vital signs and the nursing notes.  Pertinent labs & imaging results that were available during my care of the patient were reviewed by me and considered in my medical decision making (see chart for details).    MDM Rules/Calculators/A&P                      74 yo M with a chief complaints of worsening of his chronic left-sided low back pain.  The patient did have a fall at the onset of this about a week ago.  He has no significant finding on exam.  He is requesting pain medicine to go home with.  I discussed the limitations of medications prescribed from the emergency department.  I recommend that he discuss this with his family doctor or whoever is taking care of his pain on Monday.  I will start him on diclofenac gel.  Give a dose of medicines here.  8:13 AM:  I have discussed the diagnosis/risks/treatment options with the patient and believe the pt to be eligible for discharge home to follow-up with Pain managment. We also discussed returning to the ED immediately if new or worsening sx occur. We discussed the sx which are most concerning (e.g., sudden worsening pain, fever, inability to tolerate by mouth) that necessitate immediate return. Medications administered to the patient during their visit and any new prescriptions provided to the patient are listed below.  Medications given during this visit Medications  acetaminophen (TYLENOL) tablet 1,000 mg (1,000 mg Oral Given 12/22/19 0756)  ketorolac (TORADOL) 15 MG/ML injection 15 mg (15 mg Intramuscular Given 12/22/19 0757)  oxyCODONE (Oxy IR/ROXICODONE) immediate release tablet 5 mg (5 mg Oral Given 12/22/19 0756)     The patient appears reasonably screen and/or stabilized for discharge and I doubt any other medical condition or other [facility] requiring further  screening, evaluation, or treatment in the ED at this time prior to discharge.   Final Clinical Impression(s) / ED Diagnoses Final diagnoses:  Chronic left hip pain  Effusion  of left knee joint    Rx / DC Orders ED Discharge Orders         Ordered    diclofenac Sodium (VOLTAREN) 1 % GEL  4 times daily     12/22/19 0751           Melene Plan, DO 12/22/19 2081

## 2019-12-24 ENCOUNTER — Ambulatory Visit (INDEPENDENT_AMBULATORY_CARE_PROVIDER_SITE_OTHER): Payer: Medicare HMO | Admitting: Family

## 2019-12-24 ENCOUNTER — Encounter: Payer: Self-pay | Admitting: Family

## 2019-12-24 ENCOUNTER — Other Ambulatory Visit: Payer: Self-pay

## 2019-12-24 ENCOUNTER — Ambulatory Visit (INDEPENDENT_AMBULATORY_CARE_PROVIDER_SITE_OTHER): Payer: Medicare HMO

## 2019-12-24 VITALS — BP 146/78 | HR 110 | Temp 98.3°F | Ht 68.0 in | Wt 204.0 lb

## 2019-12-24 DIAGNOSIS — M25562 Pain in left knee: Secondary | ICD-10-CM

## 2019-12-24 DIAGNOSIS — M1712 Unilateral primary osteoarthritis, left knee: Secondary | ICD-10-CM | POA: Diagnosis not present

## 2019-12-24 DIAGNOSIS — M25462 Effusion, left knee: Secondary | ICD-10-CM | POA: Diagnosis not present

## 2019-12-24 LAB — CBC WITH DIFFERENTIAL/PLATELET
Basophils Absolute: 0 10*3/uL (ref 0.0–0.1)
Basophils Relative: 0.5 % (ref 0.0–3.0)
Eosinophils Absolute: 0.1 10*3/uL (ref 0.0–0.7)
Eosinophils Relative: 0.8 % (ref 0.0–5.0)
HCT: 39.7 % (ref 39.0–52.0)
Hemoglobin: 13.4 g/dL (ref 13.0–17.0)
Lymphocytes Relative: 35.8 % (ref 12.0–46.0)
Lymphs Abs: 2.4 10*3/uL (ref 0.7–4.0)
MCHC: 33.7 g/dL (ref 30.0–36.0)
MCV: 87.3 fl (ref 78.0–100.0)
Monocytes Absolute: 0.9 10*3/uL (ref 0.1–1.0)
Monocytes Relative: 13 % — ABNORMAL HIGH (ref 3.0–12.0)
Neutro Abs: 3.4 10*3/uL (ref 1.4–7.7)
Neutrophils Relative %: 49.9 % (ref 43.0–77.0)
Platelets: 205 10*3/uL (ref 150.0–400.0)
RBC: 4.55 Mil/uL (ref 4.22–5.81)
RDW: 14.2 % (ref 11.5–15.5)
WBC: 6.8 10*3/uL (ref 4.0–10.5)

## 2019-12-24 LAB — COMPREHENSIVE METABOLIC PANEL
ALT: 14 U/L (ref 0–53)
AST: 12 U/L (ref 0–37)
Albumin: 4.4 g/dL (ref 3.5–5.2)
Alkaline Phosphatase: 50 U/L (ref 39–117)
BUN: 11 mg/dL (ref 6–23)
CO2: 27 mEq/L (ref 19–32)
Calcium: 10 mg/dL (ref 8.4–10.5)
Chloride: 102 mEq/L (ref 96–112)
Creatinine, Ser: 1.06 mg/dL (ref 0.40–1.50)
GFR: 83.08 mL/min (ref >60.00)
Glucose, Bld: 104 mg/dL — ABNORMAL HIGH (ref 70–99)
Potassium: 3.9 mEq/L (ref 3.5–5.1)
Sodium: 138 mEq/L (ref 135–145)
Total Bilirubin: 0.9 mg/dL (ref 0.2–1.2)
Total Protein: 8.1 g/dL (ref 6.0–8.3)

## 2019-12-24 LAB — URIC ACID: Uric Acid, Serum: 7.5 mg/dL (ref 4.0–7.8)

## 2019-12-24 MED ORDER — PREDNISONE 20 MG PO TABS: 40.0000 mg | ORAL_TABLET | Freq: Every day | ORAL | 0 refills | Status: DC

## 2019-12-24 NOTE — Progress Notes (Signed)
Christopher Mcfarland is a 74 y.o. male with the following history as recorded in EpicCare:  Patient Active Problem List   Diagnosis Date Noted  . Right lumbar radiculitis 07/23/2019  . Neck pain, musculoskeletal 06/27/2019  . Erectile dysfunction 03/11/2019  . Vitamin D deficiency 03/11/2019  . Hyperglycemia 03/07/2019  . Encounter for well adult exam with abnormal findings 11/22/2016  . Medicare annual wellness visit, initial 11/22/2016  . Osteoarthritis of both hands 09/02/2016  . Essential hypertension 09/02/2016  . Hyperlipidemia 09/02/2016  . Liver mass 01/08/2016  . Liver fibrosis 01/08/2016  . NEUTROPENIA UNSPECIFIED 09/01/2009  . COCAINE ABUSE 09/01/2009  . Chronic hepatitis C without hepatic coma ([place]) 08/15/2009  . TRIGGER FINGER 08/15/2009  . URINALYSIS, ABNORMAL 08/15/2009  . COLONIC POLYPS, HX OF 08/15/2009    Current Outpatient Medications  Medication Sig Dispense Refill  . amLODipine (NORVASC) 5 MG tablet TAKE 1 TABLET(5 MG) BY MOUTH DAILY Annual appt due in June must see provider for future refills 90 tablet 0  . diclofenac Sodium (VOLTAREN) 1 % GEL Apply 4 g topically 4 (four) times daily. 100 g 0  . docusate sodium (COLACE) 100 MG capsule Take 1 capsule (100 mg total) by mouth every 12 (twelve) hours. 60 capsule 0  . esomeprazole (NEXIUM) 40 MG capsule Take by mouth.    . gabapentin (NEURONTIN) 100 MG capsule Take 1 capsule (100 mg total) by mouth 3 (three) times daily. 90 capsule 3  . ipratropium (ATROVENT) 0.06 % nasal spray Place 2 sprays into both nostrils 3 (three) times daily.    . polyethylene glycol (MIRALAX / GLYCOLAX) packet Take 17 g by mouth daily. 14 each 0  . rosuvastatin (CRESTOR) 10 MG tablet TAKE 1 TABLET BY MOUTH EVERY DAY Annual appt due in June must see provider for future refills 90 tablet 0  . sildenafil (VIAGRA) 100 MG tablet Take 0.5-1 tablets (50-100 mg total) by mouth daily as needed for erectile dysfunction. 5 tablet 11  . traMADol (ULTRAM)  50 MG tablet Take 1 tablet (50 mg total) by mouth every 6 (six) hours as needed. 30 tablet 2  . Vitamin D, Ergocalciferol, (DRISDOL) 1.25 MG (50000 UT) CAPS capsule Take 1 capsule (50,000 Units total) by mouth every 7 (seven) days. 12 capsule 0  . predniSONE (DELTASONE) 20 MG tablet Take 2 tablets (40 mg total) by mouth daily with breakfast. 10 tablet 0   No current facility-administered medications for this visit.    Allergies: Patient has no known allergies.  Past Medical History:  Diagnosis Date  . Essential hypertension   . Hepatitis C 2003   Treated  . Hyperlipidemia     Past Surgical History:  Procedure Laterality Date  . CARPAL TUNNEL RELEASE     bilat 1999  . HEMORRHOID SURGERY     1999/4 polyps removed rectum  . TRIGGER FINGER RELEASE     right hand 1999    Family History  Problem Relation Age of Onset  . CAD Brother 38  . Kidney cancer Mother   . Bone cancer Father   . Colon cancer Neg Hx   . Pancreatic cancer Neg Hx   . Rectal cancer Neg Hx   . Stomach cancer Neg Hx     Social History   Tobacco Use  . Smoking status: Never Smoker  . Smokeless tobacco: Never Used  Substance Use Topics  . Alcohol use: Not Currently    Alcohol/week: 2.0 - 3.0 standard drinks    Types: 2 -  3 Shots of liquor per week    Subjective:  Left knee pain since last Wednesday pm; notes he got injection in his back on Tuesday evening and fell leaving the neurosurgeon's office but did not fall on his knee; scraped up his lower left leg; pain in left hip was so problematic that he ended up going to ER on Saturday about his hip/ did not mention his knee pain at that time.   Does have history of gout; was recommended to see his PCP to discuss daily treatment but was unable to make a follow-up due to seeing his neurosurgeon about his back.     Objective:  Vitals:   12/24/19 0845  BP: (!) 146/78  Pulse: (!) 110  Temp: 98.3 F (36.8 C)  TempSrc: Oral  SpO2: 98%  Weight: 204 lb (92.5  kg)  Height: 5' 8" (1.727 m)    General: Well developed, well nourished, in no acute distress  Skin : Warm and dry.  Head: Normocephalic and atraumatic  Lungs: Respirations unlabored;  Musculoskeletal: No deformities; marked swelling noted in left knee Extremities: No edema, cyanosis, clubbing  Vessels: Symmetric bilaterally  Neurologic: Alert and oriented; speech intact; face symmetrical; in wheelchair today;  Assessment:  1. Acute pain of left knee     Plan:  Suspect gout flare; will update left knee Xray today; will also re-check uric acid level today; Rx for Prednisone 40 mg qd x 5 days; Will also plan to start Allopurinol for patient next week once acute gout flare has resolved; he will need to see his PCP in follow-up in the next 4-6 weeks.  This visit occurred during the SARS-CoV-2 public health emergency.  Safety protocols were in place, including screening questions prior to the visit, additional usage of staff PPE, and extensive cleaning of exam room while observing appropriate contact time as indicated for disinfecting solutions.     No follow-ups on file.  Orders Placed This Encounter  Procedures  . DG Knee Complete 4 Views Left    Standing Status:   Future    Number of Occurrences:   1    Standing Expiration Date:   02/22/2021    Order Specific Question:   Reason for Exam (SYMPTOM  OR DIAGNOSIS REQUIRED)    Answer:   left knee swelling    Order Specific Question:   Preferred imaging location?    Answer:   [facility]    Order Specific Question:   Radiology Contrast Protocol - do NOT remove file path    Answer:   [email or web address]  . CBC with Differential/Platelet  . Comp Met (CMET)  . Uric acid    Requested Prescriptions   Signed Prescriptions Disp Refills  . predniSONE (DELTASONE) 20 MG tablet 10 tablet 0    Sig: Take 2 tablets (40 mg total) by mouth daily with breakfast.

## 2019-12-26 ENCOUNTER — Encounter: Payer: Self-pay | Admitting: Family Medicine

## 2019-12-26 ENCOUNTER — Other Ambulatory Visit: Payer: Self-pay

## 2019-12-26 ENCOUNTER — Ambulatory Visit (INDEPENDENT_AMBULATORY_CARE_PROVIDER_SITE_OTHER): Payer: Medicare HMO | Admitting: Family Medicine

## 2019-12-26 ENCOUNTER — Ambulatory Visit (INDEPENDENT_AMBULATORY_CARE_PROVIDER_SITE_OTHER): Payer: Medicare HMO

## 2019-12-26 VITALS — BP 140/80 | HR 105 | Ht 68.0 in | Wt 205.0 lb

## 2019-12-26 DIAGNOSIS — M25562 Pain in left knee: Secondary | ICD-10-CM | POA: Diagnosis not present

## 2019-12-26 DIAGNOSIS — M25462 Effusion, left knee: Secondary | ICD-10-CM

## 2019-12-26 LAB — TIQ-NTM

## 2019-12-26 NOTE — Patient Instructions (Addendum)
You had a L knee injection today.  Call or go to the ER if you develop a large red swollen joint with extreme pain or oozing puss.   Call or go to the ER if you develop a large red swollen joint with extreme pain or oozing puss.  I will get results back to you soon.  Let me know if you dont feel better.  I can send in other medicines.    Gout  Gout is painful swelling of your joints. Gout is a type of arthritis. It is caused by having too much uric acid in your body. Uric acid is a chemical that is made when your body breaks down substances called purines. If your body has too much uric acid, sharp crystals can form and build up in your joints. This causes pain and swelling. Gout attacks can happen quickly and be very painful (acute gout). Over time, the attacks can affect more joints and happen more often (chronic gout). What are the causes?  Too much uric acid in your blood. This can happen because: ? Your kidneys do not remove enough uric acid from your blood. ? Your body makes too much uric acid. ? You eat too many foods that are high in purines. These foods include organ meats, some seafood, and beer.  Trauma or stress. What increases the risk?  Having a family history of gout.  Being male and middle-aged.  Being male and having gone through menopause.  Being very overweight (obese).  Drinking alcohol, especially beer.  Not having enough water in the body (being dehydrated).  Losing weight too quickly.  Having an organ transplant.  Having lead poisoning.  Taking certain medicines.  Having kidney disease.  Having a skin condition called psoriasis. What are the signs or symptoms? An attack of acute gout usually happens in just one joint. The most common place is the big toe. Attacks often start at night. Other joints that may be affected include joints of the feet, ankle, knee, fingers, wrist, or elbow. Symptoms of an attack may include:  Very bad  pain.  Warmth.  Swelling.  Stiffness.  Shiny, red, or purple skin.  Tenderness. The affected joint may be very painful to touch.  Chills and fever. Chronic gout may cause symptoms more often. More joints may be involved. You may also have white or yellow lumps (tophi) on your hands or feet or in other areas near your joints. How is this treated?  Treatment for this condition has two phases: treating an acute attack and preventing future attacks.  Acute gout treatment may include: ? NSAIDs. ? Steroids. These are taken by mouth or injected into a joint. ? Colchicine. This medicine relieves pain and swelling. It can be given by mouth or through an IV tube.  Preventive treatment may include: ? Taking small doses of NSAIDs or colchicine daily. ? Using a medicine that reduces uric acid levels in your blood. ? Making changes to your diet. You may need to see a food expert (dietitian) about what to eat and drink to prevent gout. Follow these instructions at home: During a gout attack   If told, put ice on the painful area: ? Put ice in a plastic bag. ? Place a towel between your skin and the bag. ? Leave the ice on for 20 minutes, 2-3 times a day.  Raise (elevate) the painful joint above the level of your heart as often as you can.  Rest the joint as much  as possible. If the joint is in your leg, you may be given crutches.  Follow instructions from your doctor about what you cannot eat or drink. Avoiding future gout attacks  Eat a low-purine diet. Avoid foods and drinks such as: ? Liver. ? Kidney. ? Anchovies. ? Asparagus. ? Herring. ? Mushrooms. ? Mussels. ? Beer.  Stay at a healthy weight. If you want to lose weight, talk with your doctor. Do not lose weight too fast.  Start or continue an exercise plan as told by your doctor. Eating and drinking  Drink enough fluids to keep your pee (urine) pale yellow.  If you drink alcohol: ? Limit how much you use to:  0-1  drink a day for women.  0-2 drinks a day for men. ? Be aware of how much alcohol is in your drink. In the [place] one drink equals one 12 oz bottle of beer (355 mL), one 5 oz glass of wine (148 mL), or one 1 oz glass of hard liquor (44 mL). General instructions  Take over-the-counter and prescription medicines only as told by your doctor.  Do not drive or use heavy machinery while taking prescription pain medicine.  Return to your normal activities as told by your doctor. Ask your doctor what activities are safe for you.  Keep all follow-up visits as told by your doctor. This is important. Contact a doctor if:  You have another gout attack.  You still have symptoms of a gout attack after 10 days of treatment.  You have problems (side effects) because of your medicines.  You have chills or a fever.  You have burning pain when you pee (urinate).  You have pain in your lower back or belly. Get help right away if:  You have very bad pain.  Your pain cannot be controlled.  You cannot pee. Summary  Gout is painful swelling of the joints.  The most common site of pain is the big toe, but it can affect other joints.  Medicines and avoiding some foods can help to prevent and treat gout attacks. This information is not intended to replace advice given to you by your health care provider. Make sure you discuss any questions you have with your health care provider. Document Revised: 03/22/2018 Document Reviewed: 03/22/2018 Elsevier Patient Education  [employer].

## 2019-12-26 NOTE — Progress Notes (Signed)
Subjective:    CC: L knee pain  I, Judy Pimple, am serving as a scribe for Dr. Lynne Leader.  HPI: Pt is a 74 y/o male presenting w/ c/o L knee pain after suffering a fall on/around 12/17/19 and injuring his L knee.  He went to the [facility] ED on 12/22/19 w/ con't L knee pain and swelling and then followed up w/ Jodi Mourning, FNP on 12/24/19 who thought he might be suffering from a gout flare and prescribed Prednisone 4m x 5 days.  He locates his knee pain to around knee cap has swelling.  He rates his pain today as very low before the prednisone helped and describes his pain as stiff.  Radiating pain: up to thigh L knee swelling: yes L knee mechanical symptoms: stiffness  Aggravating factors: worse at night or covers touching it. Treatments tried: prednisone 40 mg x 5 days; voltaren gel.  Prednisone has helped a bit over the last few days.  Diagnostic imaging: L knee XR- 12/24/19;  Pertinent review of Systems: No fevers or chills  Relevant historical information: History of gout, history hepatitis C, hypertension, history lumbar radiculopathy   Objective:    Vitals:   12/26/19 1426  BP: 140/80  Pulse: (!) 105  SpO2: 98%   General: Well Developed, well nourished, and in no acute distress.   MSK: Left knee: Large effusion no erythema otherwise normal-appearing Range of motion 0-100 degrees without crepitation. Stable ligamentous exam. Mildly tender palpation joint lines. Intact strength.  Lab and Radiology Results Results for orders placed or performed in visit on 12/24/19 (from the past 72 hour(s))  CBC with Differential/Platelet     Status: Abnormal   Collection Time: 12/24/19  9:09 AM  Result Value Ref Range   WBC 6.8 4.0 - 10.5 K/uL   RBC 4.55 4.22 - 5.81 Mil/uL   Hemoglobin 13.4 13.0 - 17.0 g/dL   HCT 39.7 39.0 - 52.0 %   MCV 87.3 78.0 - 100.0 fl   MCHC 33.7 30.0 - 36.0 g/dL   RDW 14.2 11.5 - 15.5 %   Platelets 205.0 150.0 - 400.0 K/uL   Neutrophils Relative % 49.9 43.0 - 77.0 %   Lymphocytes Relative 35.8 12.0 - 46.0 %   Monocytes Relative 13.0 (H) 3.0 - 12.0 %   Eosinophils Relative 0.8 0.0 - 5.0 %   Basophils Relative 0.5 0.0 - 3.0 %   Neutro Abs 3.4 1.4 - 7.7 K/uL   Lymphs Abs 2.4 0.7 - 4.0 K/uL   Monocytes Absolute 0.9 0.1 - 1.0 K/uL   Eosinophils Absolute 0.1 0.0 - 0.7 K/uL   Basophils Absolute 0.0 0.0 - 0.1 K/uL  Comp Met (CMET)     Status: Abnormal   Collection Time: 12/24/19  9:09 AM  Result Value Ref Range   Sodium 138 135 - 145 mEq/L   Potassium 3.9 3.5 - 5.1 mEq/L   Chloride 102 96 - 112 mEq/L   CO2 27 19 - 32 mEq/L   Glucose, Bld 104 (H) 70 - 99 mg/dL   BUN 11 6 - 23 mg/dL   Creatinine, Ser 1.06 0.40 - 1.50 mg/dL   Total Bilirubin 0.9 0.2 - 1.2 mg/dL   Alkaline Phosphatase 50 39 - 117 U/L   AST 12 0 - 37 U/L   ALT 14 0 - 53 U/L   Total Protein 8.1 6.0 - 8.3 g/dL   Albumin 4.4 3.5 - 5.2 g/dL   GFR 83.08 >60.00 mL/min  Calcium 10.0 8.4 - 10.5 mg/dL  Uric acid     Status: None   Collection Time: 12/24/19  9:09 AM  Result Value Ref Range   Uric Acid, Serum 7.5 4.0 - 7.8 mg/dL   DG Knee Complete 4 Views Left  Result Date: 12/24/2019 CLINICAL DATA:  Left knee swelling and pain for 5 days. History of gout. EXAM: LEFT KNEE - COMPLETE 4+ VIEW COMPARISON:  None. FINDINGS: No acute fracture or dislocation is identified. There is a moderate-sized knee joint effusion. There is mild medial compartment joint space narrowing and marginal osteophytosis. No osseous erosion is evident. There is a small superior patellar enthesophyte. Atherosclerotic vascular calcifications are noted. IMPRESSION: 1. Moderate-sized knee joint effusion. No acute osseous abnormality identified. 2. Mild medial compartment osteoarthrosis. Electronically Signed   By: Logan Bores M.D.   On: 12/24/2019 09:28  I, Lynne Leader, personally (independently) visualized and performed the interpretation of the images attached in this note.  Procedure:  Real-time Ultrasound Guided aspiration and injection of left knee Device: Philips Affiniti 50G Images permanently stored and available for review in the ultrasound unit. Verbal informed consent obtained.  Discussed risks and benefits of procedure. Warned about infection bleeding damage to structures skin hypopigmentation and fat atrophy among others. Patient expresses understanding and agreement Time-out conducted.   Noted no overlying erythema, induration, or other signs of local infection.   Large joint effusion seen on ultrasound examination Skin prepped in a sterile fashion.   Local anesthesia: Topical Ethyl chloride.   With sterile technique and under real time ultrasound guidance:  3 mL of lidocaine injected subcutaneously and along planned injection track to achieve good anesthesia. Skin was again cleaned with rubbing alcohol. 18-gauge needle used to access the joint capsule and 40 mL of cloudy yellowish fluid was removed. Syringe exchanged and 40 mg of Kenalog and 3 mL of Marcaine were injected    Completed without difficulty   Pain immediately resolved suggesting accurate placement of the medication.   Advised to call if fevers/chills, erythema, induration, drainage, or persistent bleeding.   Images permanently stored and available for review in the ultrasound unit.  Impression: Technically successful ultrasound guided injection.     Impression and Recommendations:    Assessment and Plan: 74 y.o. male with left knee effusion and pain.  Concern for gout flare versus DJD exacerbation.  Infection quite unlikely given absence of fever.  However given somewhat uncertainty of diagnosis we will send fluid off for cell count crystal analysis and differential as well as culture.  Adjust treatment based on results of joint aspirate.  Would consider colchicine.  PDMP not reviewed this encounter. Orders Placed This Encounter  Procedures  . Anaerobic and Aerobic Culture    Source left  knee fluid    Standing Status:   Future    Number of Occurrences:   1    Standing Expiration Date:   12/25/2020  . [place] LIMITED JOINT SPACE STRUCTURES LOW LEFT    Standing Status:   Future    Number of Occurrences:   1    Standing Expiration Date:   02/24/2021    Order Specific Question:   Reason for Exam (SYMPTOM  OR DIAGNOSIS REQUIRED)    Answer:   left knee pain    Order Specific Question:   Preferred imaging location?    Answer:   [place]  . Synovial cell count + diff, w/ crystals    Standing Status:   Future  Number of Occurrences:   1    Standing Expiration Date:   12/25/2020   No orders of the defined types were placed in this encounter.   Discussed warning signs or symptoms. Please see discharge instructions. Patient expresses understanding.   The above documentation has been reviewed and is accurate and complete Lynne Leader

## 2019-12-27 NOTE — Progress Notes (Signed)
Form completed and faxed 12/27/19

## 2019-12-29 ENCOUNTER — Other Ambulatory Visit: Payer: Self-pay | Admitting: Internal Medicine

## 2019-12-29 DIAGNOSIS — I10 Essential (primary) hypertension: Secondary | ICD-10-CM

## 2019-12-29 NOTE — Telephone Encounter (Signed)
Please refill as per office routine med refill policy (all routine meds refilled for 3 mo or monthly per pt preference up to one year from last visit, then month to month grace period for 3 mo, then further med refills will have to be denied)  

## 2019-12-31 NOTE — Progress Notes (Signed)
Fluid culture is negative.  No bacterial growth.

## 2020-01-01 ENCOUNTER — Telehealth: Payer: Self-pay | Admitting: Family Medicine

## 2020-01-01 ENCOUNTER — Encounter: Payer: Self-pay | Admitting: Family Medicine

## 2020-01-01 LAB — ANAEROBIC AND AEROBIC CULTURE
AER RESULT:: NO GROWTH
GRAM STAIN:: NONE SEEN
MICRO NUMBER:: 10362711
MICRO NUMBER:: 10362712
SPECIMEN QUALITY:: ADEQUATE
SPECIMEN QUALITY:: ADEQUATE

## 2020-01-01 LAB — SYNOVIAL CELL COUNT + DIFF, W/ CRYSTALS
Basophils, %: 0 %
Eosinophils-Synovial: 0 % (ref 0–2)
Lymphocytes-Synovial Fld: 21 % (ref 0–74)
Monocyte/Macrophage: 32 % (ref 0–69)
Neutrophil, Synovial: 47 % — ABNORMAL HIGH (ref 0–24)
Synoviocytes, %: 0 % (ref 0–15)
WBC, Synovial: 7340 cells/uL — ABNORMAL HIGH (ref <150)

## 2020-01-01 NOTE — Telephone Encounter (Signed)
Called and spoke to the pt.  He states that he is feeling better in terms of his L knee pain.  He reports feeling "tightness" in his L knee and lower leg but no real pain.  He feels like the swelling has improved.  He does feel like the knee injection helped.  He asks if he needs any type of medication for the gout and I advise that I will let Dr. Denyse Amass know about our conversation and call him back w/ an update.  Confirmed [facility] at [facility] and [address].

## 2020-01-01 NOTE — Progress Notes (Signed)
Received fax from [facility] regarding cell count differential and crystal analysis of fluid aspirate. Total nucleated cells 7340 Neutrophil percentage 47.  Positive for monosodium urate crystals.

## 2020-01-01 NOTE — Telephone Encounter (Signed)
Crystal analysis of fluid aspirated from your joint did show gout crystals. How are you feeling?

## 2020-01-02 ENCOUNTER — Encounter: Payer: Self-pay | Admitting: Family Medicine

## 2020-01-02 ENCOUNTER — Other Ambulatory Visit: Payer: Self-pay | Admitting: Physical Therapy

## 2020-01-02 DIAGNOSIS — M109 Gout, unspecified: Secondary | ICD-10-CM

## 2020-01-02 DIAGNOSIS — M25562 Pain in left knee: Secondary | ICD-10-CM

## 2020-01-02 HISTORY — DX: Gout, unspecified: M10.9

## 2020-01-02 MED ORDER — ALLOPURINOL 300 MG PO TABS: 300.0000 mg | ORAL_TABLET | Freq: Every day | ORAL | 3 refills | Status: DC

## 2020-01-02 MED ORDER — COLCHICINE 0.6 MG PO TABS: 0.6000 mg | ORAL_TABLET | Freq: Every day | ORAL | 2 refills | Status: DC

## 2020-01-02 NOTE — Telephone Encounter (Signed)
I have sent in two medications. I sent allopurinol in.  Take allopurinol daily to lower uric acid level and prevent further gout attacks in the future. I also sent in colchicine.  Take this daily for the first 3 months while getting started on allopurinol to prevent gout flares.  After 3 months you should not have to take this medication any longer. We should recheck gout uric acid level in 6 to 12 weeks.

## 2020-01-02 NOTE — Telephone Encounter (Signed)
Called and informed pt of new medication orders that have been placed.  Pt verbalizes understanding.  Spoke to Dr. Denyse Amass regarding re-check of uric acid levels and he is ok with this being checked during his physical w/ Dr. Jonny Ruiz on 03/07/20.  Future order placed for uric acid labs.

## 2020-01-02 NOTE — Progress Notes (Signed)
Cell count crystal analysis concerning for gout.

## 2020-01-04 ENCOUNTER — Telehealth: Payer: Self-pay | Admitting: Family Medicine

## 2020-01-04 NOTE — Telephone Encounter (Signed)
Called pt and informed him of the PA situation and the GoodRx option.  Pt verbalizes understanding.

## 2020-01-04 NOTE — Telephone Encounter (Signed)
Colchicine requires prior authorization with your insurance.  We are submitting for authorization but this process can take a while.  You could always use GoodRx.  The cheapest place to get this medication is at [facility] and it would cost about $40.  [facility] cost is $67.

## 2020-01-08 ENCOUNTER — Telehealth: Payer: Self-pay | Admitting: Family Medicine

## 2020-01-08 MED ORDER — COLCHICINE 0.6 MG PO CAPS: 1.0000 | ORAL_CAPSULE | Freq: Every day | ORAL | 3 refills | Status: DC

## 2020-01-08 NOTE — Telephone Encounter (Signed)
Insurance prefers colchicine capsules instead of tablets.  I will send that to the pharmacy.  Let me know if there is a problem.  We are working on prior authorization for colchicine tablets as well.

## 2020-03-07 ENCOUNTER — Other Ambulatory Visit: Payer: Self-pay

## 2020-03-07 ENCOUNTER — Ambulatory Visit (INDEPENDENT_AMBULATORY_CARE_PROVIDER_SITE_OTHER): Payer: Medicare HMO | Admitting: Internal Medicine

## 2020-03-07 ENCOUNTER — Encounter: Payer: Self-pay | Admitting: Internal Medicine

## 2020-03-07 ENCOUNTER — Ambulatory Visit: Payer: Medicare HMO

## 2020-03-07 VITALS — BP 130/90 | HR 100 | Temp 98.1°F | Ht 68.0 in | Wt 199.0 lb

## 2020-03-07 DIAGNOSIS — Z23 Encounter for immunization: Secondary | ICD-10-CM

## 2020-03-07 DIAGNOSIS — R739 Hyperglycemia, unspecified: Secondary | ICD-10-CM | POA: Diagnosis not present

## 2020-03-07 DIAGNOSIS — M109 Gout, unspecified: Secondary | ICD-10-CM | POA: Diagnosis not present

## 2020-03-07 DIAGNOSIS — E559 Vitamin D deficiency, unspecified: Secondary | ICD-10-CM | POA: Diagnosis not present

## 2020-03-07 DIAGNOSIS — M5416 Radiculopathy, lumbar region: Secondary | ICD-10-CM

## 2020-03-07 DIAGNOSIS — E538 Deficiency of other specified B group vitamins: Secondary | ICD-10-CM | POA: Diagnosis not present

## 2020-03-07 DIAGNOSIS — I10 Essential (primary) hypertension: Secondary | ICD-10-CM | POA: Diagnosis not present

## 2020-03-07 DIAGNOSIS — E782 Mixed hyperlipidemia: Secondary | ICD-10-CM | POA: Diagnosis not present

## 2020-03-07 DIAGNOSIS — Z0000 Encounter for general adult medical examination without abnormal findings: Secondary | ICD-10-CM

## 2020-03-07 MED ORDER — ALLOPURINOL 300 MG PO TABS: 300.0000 mg | ORAL_TABLET | Freq: Every day | ORAL | 3 refills | Status: DC

## 2020-03-07 MED ORDER — AMLODIPINE BESYLATE 5 MG PO TABS: ORAL_TABLET | ORAL | 3 refills | Status: DC

## 2020-03-07 MED ORDER — ROSUVASTATIN CALCIUM 10 MG PO TABS: ORAL_TABLET | ORAL | 3 refills | Status: DC

## 2020-03-07 MED ORDER — SILDENAFIL CITRATE 100 MG PO TABS: 50.0000 mg | ORAL_TABLET | Freq: Every day | ORAL | 11 refills | Status: DC | PRN

## 2020-03-07 MED ORDER — COLCHICINE 0.6 MG PO CAPS: 1.0000 | ORAL_CAPSULE | Freq: Every day | ORAL | 3 refills | Status: DC

## 2020-03-07 NOTE — Patient Instructions (Addendum)
Ok to try the OTC Salonpas patch for pain as needed  You had the Tdap tetanus shot today  Please take OTC Vitamin D3 at 2000 units per day, indefinitely.  Please continue all other medications as before, and refills have been done if requested.  Please have the pharmacy call with any other refills you may need.  Please continue your efforts at being more active, low cholesterol diet, and weight control.  You are otherwise up to date with prevention measures today.  Please keep your appointments with your specialists as you may have planned  Please go to the LAB at the blood drawing area for the tests to be done  You will be contacted by phone if any changes need to be made immediately.  Otherwise, you will receive a letter about your results with an explanation, but please check with MyChart first.  Please remember to sign up for MyChart if you have not done so, as this will be important to you in the future with finding out test results, communicating by private email, and scheduling acute appointments online when needed.  Please make an Appointment to return for your 1 year visit, or sooner if needed

## 2020-03-07 NOTE — Progress Notes (Signed)
Subjective:    Patient ID: Christopher Mcfarland, male    DOB: [birth year], 74 y.o.   MRN: [id number]  HPI  Here for wellness and f/u;  Overall doing ok;  Pt denies Chest pain, worsening SOB, DOE, wheezing, orthopnea, PND, worsening LE edema, palpitations, dizziness or syncope.  Pt denies neurological change such as new headache, facial or extremity weakness.  Pt denies polydipsia, polyuria, or low sugar symptoms. Pt states overall good compliance with treatment and medications, good tolerability, and has been trying to follow appropriate diet.  Pt denies worsening depressive symptoms, suicidal ideation or panic. No fever, night sweats, wt loss, loss of appetite, or other constitutional symptoms.  Pt states good ability with ADL's, has low fall risk, home safety reviewed and adequate, no other significant changes in hearing or vision, and only occasionally active with exercise.  S/p ESI for right radiculitis, knee improved acute gout.   Past Medical History:  Diagnosis Date  . Essential hypertension   . Gout 01/02/2020   Gout crystals on joint fluid April 2021  . Hepatitis C 2003   Treated  . Hyperlipidemia    Past Surgical History:  Procedure Laterality Date  . CARPAL TUNNEL RELEASE     bilat 1999  . HEMORRHOID SURGERY     1999/4 polyps removed rectum  . TRIGGER FINGER RELEASE     right hand 1999    reports that he has never smoked. He has never used smokeless tobacco. He reports previous alcohol use of about 2.0 - 3.0 standard drinks of alcohol per week. He reports that he does not use drugs. family history includes Bone cancer in his father; CAD (age of onset: 35) in his brother; Kidney cancer in his mother. No Known Allergies Current Outpatient Medications on File Prior to Visit  Medication Sig Dispense Refill  . diclofenac Sodium (VOLTAREN) 1 % GEL Apply 4 g topically 4 (four) times daily. 100 g 0  . docusate sodium (COLACE) 100 MG capsule Take 1 capsule (100 mg total) by mouth every 12  (twelve) hours. 60 capsule 0  . esomeprazole (NEXIUM) 40 MG capsule Take by mouth.    Marland Kitchen ipratropium (ATROVENT) 0.06 % nasal spray Place 2 sprays into both nostrils 3 (three) times daily.    . polyethylene glycol (MIRALAX / GLYCOLAX) packet Take 17 g by mouth daily. 14 each 0   No current facility-administered medications on file prior to visit.   Review of Systems All otherwise neg per pt     Objective:   Physical Exam BP 130/90 (BP Location: Left Arm, Patient Position: Sitting, Cuff Size: Normal)   Pulse 100   Temp 98.1 F (36.7 C) (Oral)   Ht 5\' 8"  (1.727 m)   Wt 199 lb (90.3 kg)   SpO2 96%   BMI 30.26 kg/m  VS noted,  Constitutional: Pt appears in NAD HENT: Head: NCAT.  Right Ear: External ear normal.  Left Ear: External ear normal.  Eyes: . Pupils are equal, round, and reactive to light. Conjunctivae and EOM are normal Nose: without d/c or deformity Neck: Neck supple. Gross normal ROM Cardiovascular: Normal rate and regular rhythm.   Pulmonary/Chest: Effort normal and breath sounds without rales or wheezing.  Abd:  Soft, NT, ND, + BS, no organomegaly Neurological: Pt is alert. At baseline orientation, motor grossly intact Skin: Skin is warm. No rashes, other new lesions, no LE edema Psychiatric: Pt behavior is normal without agitation  All otherwise neg per pt Lab Results  Component Value Date   WBC 4.9 03/07/2020   HGB 13.1 (L) 03/07/2020   HCT 40.1 03/07/2020   PLT 173 03/07/2020   GLUCOSE 90 03/07/2020   CHOL 150 03/07/2020   TRIG 86 03/07/2020   HDL 41 03/07/2020   LDLCALC 91 03/07/2020   ALT 24 03/07/2020   AST 24 03/07/2020   NA 141 03/07/2020   K 3.8 03/07/2020   CL 105 03/07/2020   CREATININE 1.09 03/07/2020   BUN 18 03/07/2020   CO2 26 03/07/2020   TSH 1.93 03/07/2020   PSA 0.5 03/07/2020   INR 1.17 08/20/2015   HGBA1C 5.9 (H) 03/07/2020         Assessment & Plan:

## 2020-03-08 ENCOUNTER — Encounter: Payer: Self-pay | Admitting: Internal Medicine

## 2020-03-08 LAB — HEPATIC FUNCTION PANEL
AG Ratio: 1.6 (calc) (ref 1.0–2.5)
ALT: 24 U/L (ref 9–46)
AST: 24 U/L (ref 10–35)
Albumin: 4.5 g/dL (ref 3.6–5.1)
Alkaline phosphatase (APISO): 57 U/L (ref 35–144)
Bilirubin, Direct: 0.1 mg/dL (ref 0.0–0.2)
Globulin: 2.8 g/dL (calc) (ref 1.9–3.7)
Indirect Bilirubin: 0.4 mg/dL (calc) (ref 0.2–1.2)
Total Bilirubin: 0.5 mg/dL (ref 0.2–1.2)
Total Protein: 7.3 g/dL (ref 6.1–8.1)

## 2020-03-08 LAB — CBC WITH DIFFERENTIAL/PLATELET
Absolute Monocytes: 470 cells/uL (ref 200–950)
Basophils Absolute: 39 cells/uL (ref 0–200)
Basophils Relative: 0.8 %
Eosinophils Absolute: 59 cells/uL (ref 15–500)
Eosinophils Relative: 1.2 %
HCT: 40.1 % (ref 38.5–50.0)
Hemoglobin: 13.1 g/dL — ABNORMAL LOW (ref 13.2–17.1)
Lymphs Abs: 2666 cells/uL (ref 850–3900)
MCH: 28.7 pg (ref 27.0–33.0)
MCHC: 32.7 g/dL (ref 32.0–36.0)
MCV: 87.9 fL (ref 80.0–100.0)
MPV: 12.6 fL — ABNORMAL HIGH (ref 7.5–12.5)
Monocytes Relative: 9.6 %
Neutro Abs: 1666 cells/uL (ref 1500–7800)
Neutrophils Relative %: 34 %
Platelets: 173 10*3/uL (ref 140–400)
RBC: 4.56 10*6/uL (ref 4.20–5.80)
RDW: 14.3 % (ref 11.0–15.0)
Total Lymphocyte: 54.4 %
WBC: 4.9 10*3/uL (ref 3.8–10.8)

## 2020-03-08 LAB — VITAMIN D 25 HYDROXY (VIT D DEFICIENCY, FRACTURES): Vit D, 25-Hydroxy: 32 ng/mL (ref 30–100)

## 2020-03-08 LAB — BASIC METABOLIC PANEL
BUN: 18 mg/dL (ref 7–25)
CO2: 26 mmol/L (ref 20–32)
Calcium: 9.8 mg/dL (ref 8.6–10.3)
Chloride: 105 mmol/L (ref 98–110)
Creat: 1.09 mg/dL (ref 0.70–1.18)
Glucose, Bld: 90 mg/dL (ref 65–99)
Potassium: 3.8 mmol/L (ref 3.5–5.3)
Sodium: 141 mmol/L (ref 135–146)

## 2020-03-08 LAB — URINALYSIS, ROUTINE W REFLEX MICROSCOPIC
Bilirubin Urine: NEGATIVE
Glucose, UA: NEGATIVE
Hgb urine dipstick: NEGATIVE
Ketones, ur: NEGATIVE
Leukocytes,Ua: NEGATIVE
Nitrite: NEGATIVE
Protein, ur: NEGATIVE
Specific Gravity, Urine: 1.022 (ref 1.001–1.03)
pH: <=5 (ref 5.0–8.0)

## 2020-03-08 LAB — HEMOGLOBIN A1C
Hgb A1c MFr Bld: 5.9 % of total Hgb — ABNORMAL HIGH (ref <5.7)
Mean Plasma Glucose: 123 (calc)
eAG (mmol/L): 6.8 (calc)

## 2020-03-08 LAB — LIPID PANEL
Cholesterol: 150 mg/dL (ref <200)
HDL: 41 mg/dL (ref >=40)
LDL Cholesterol (Calc): 91 mg/dL (calc)
Non-HDL Cholesterol (Calc): 109 mg/dL (calc) (ref <130)
Total CHOL/HDL Ratio: 3.7 (calc) (ref <5.0)
Triglycerides: 86 mg/dL (ref <150)

## 2020-03-08 LAB — TSH: TSH: 1.93 mIU/L (ref 0.40–4.50)

## 2020-03-08 LAB — VITAMIN B12: Vitamin B-12: 453 pg/mL (ref 200–1100)

## 2020-03-08 LAB — URIC ACID: Uric Acid, Serum: 4.7 mg/dL (ref 4.0–8.0)

## 2020-03-08 LAB — PSA: PSA: 0.5 ng/mL (ref <=4.0)

## 2020-03-08 NOTE — Assessment & Plan Note (Addendum)
For contd diet, declines other statin

## 2020-03-08 NOTE — Assessment & Plan Note (Signed)
Currently improved, cont same tx

## 2020-03-08 NOTE — Assessment & Plan Note (Signed)
Improved recently, cont samt tx

## 2020-03-08 NOTE — Assessment & Plan Note (Signed)
stable overall by history and exam, recent data reviewed with pt, and pt to continue medical treatment as before,  to f/u any worsening symptoms or concerns  

## 2020-03-08 NOTE — Assessment & Plan Note (Signed)

## 2020-03-08 NOTE — Assessment & Plan Note (Signed)
For oral replacement 

## 2020-03-21 ENCOUNTER — Other Ambulatory Visit: Payer: Self-pay

## 2020-03-21 ENCOUNTER — Ambulatory Visit (INDEPENDENT_AMBULATORY_CARE_PROVIDER_SITE_OTHER): Payer: Medicare HMO

## 2020-03-21 VITALS — BP 140/80 | HR 100 | Temp 98.4°F | Resp 16 | Ht 68.0 in | Wt 197.2 lb

## 2020-03-21 DIAGNOSIS — Z0000 Encounter for general adult medical examination without abnormal findings: Secondary | ICD-10-CM

## 2020-03-21 NOTE — Progress Notes (Signed)
Subjective:   KENDRA WOOLFORD is a 74 y.o. male who presents for Medicare Annual/Subsequent preventive examination.     Review of Systems    NO ROS. Medicare Wellness Exam. Cardiac Risk Factors include: advanced age (>33men, >86 women);dyslipidemia;hypertension;male gender     Objective:    Today's Vitals   03/21/20 1233  BP: 140/80  Pulse: 100  Resp: 16  Temp: 98.4 F (36.9 C)  SpO2: 96%  Weight: 197 lb 3.2 oz (89.4 kg)  Height: 5\' 8"  (1.727 m)  PainSc: 0-No pain   Body mass index is 29.98 kg/m.  Advanced Directives 03/21/2020 12/22/2019 07/28/2019 03/07/2019 07/30/2018 08/12/2017 06/10/2017  Does Patient Have a Medical Advance Directive? No No No No No No No  Would patient like information on creating a medical advance directive? No - Patient declined - No - Patient declined Yes (MAU/Ambulatory/Procedural Areas - Information given) No - Patient declined - -    Current Medications (verified) Outpatient Encounter Medications as of 03/21/2020  Medication Sig  . allopurinol (ZYLOPRIM) 300 MG tablet Take 1 tablet (300 mg total) by mouth daily.  05/22/2020 amLODipine (NORVASC) 5 MG tablet TAKE 1 TABLET(5 MG) BY MOUTH DAILY  . Colchicine 0.6 MG CAPS Take 1 capsule by mouth daily.  . diclofenac Sodium (VOLTAREN) 1 % GEL Apply 4 g topically 4 (four) times daily.  Marland Kitchen docusate sodium (COLACE) 100 MG capsule Take 1 capsule (100 mg total) by mouth every 12 (twelve) hours.  Marland Kitchen esomeprazole (NEXIUM) 40 MG capsule Take by mouth.  Marland Kitchen ipratropium (ATROVENT) 0.06 % nasal spray Place 2 sprays into both nostrils 3 (three) times daily.  . polyethylene glycol (MIRALAX / GLYCOLAX) packet Take 17 g by mouth daily.  . rosuvastatin (CRESTOR) 10 MG tablet TAKE 1 TABLET BY MOUTH EVERY DAy  . sildenafil (VIAGRA) 100 MG tablet Take 0.5-1 tablets (50-100 mg total) by mouth daily as needed for erectile dysfunction.   No facility-administered encounter medications on file as of 03/21/2020.    Allergies  (verified) Patient has no known allergies.   History: Past Medical History:  Diagnosis Date  . Essential hypertension   . Gout 01/02/2020   Gout crystals on joint fluid April 2021  . Hepatitis C 2003   Treated  . Hyperlipidemia    Past Surgical History:  Procedure Laterality Date  . CARPAL TUNNEL RELEASE     bilat 1999  . HEMORRHOID SURGERY     1999/4 polyps removed rectum  . TRIGGER FINGER RELEASE     right hand 1999   Family History  Problem Relation Age of Onset  . CAD Brother 34  . Kidney cancer Mother   . Bone cancer Father   . Colon cancer Neg Hx   . Pancreatic cancer Neg Hx   . Rectal cancer Neg Hx   . Stomach cancer Neg Hx    Social History   Socioeconomic History  . Marital status: Married    Spouse name: Not on file  . Number of children: 4  . Years of education: 71  . Highest education level: Not on file  Occupational History  . Occupation: Painter  Tobacco Use  . Smoking status: Never Smoker  . Smokeless tobacco: Never Used  Vaping Use  . Vaping Use: Never used  Substance and Sexual Activity  . Alcohol use: Not Currently    Alcohol/week: 2.0 - 3.0 standard drinks    Types: 2 - 3 Shots of liquor per week  . Drug use: No  Comment: smoked marijuana "years ago"  . Sexual activity: Yes  Other Topics Concern  . Not on file  Social History Narrative   Fun: Play pool, yard work, gym, and Economist Determinants of Health   Financial Resource Strain: Low Risk   . Difficulty of Paying Living Expenses: Not hard at all  Food Insecurity: No Food Insecurity  . Worried About Programme researcher, broadcasting/film/video in the Last Year: Never true  . Ran Out of Food in the Last Year: Never true  Transportation Needs: No Transportation Needs  . Lack of Transportation (Medical): No  . Lack of Transportation (Non-Medical): No  Physical Activity: Sufficiently Active  . Days of Exercise per Week: 7 days  . Minutes of Exercise per Session: 60 min  Stress: No Stress Concern  Present  . Feeling of Stress : Not at all  Social Connections: Socially Integrated  . Frequency of Communication with Friends and Family: More than three times a week  . Frequency of Social Gatherings with Friends and Family: More than three times a week  . Attends Religious Services: More than 4 times per year  . Active Member of Clubs or Organizations: Yes  . Attends Banker Meetings: More than 4 times per year  . Marital Status: Living with partner    Tobacco Counseling Counseling given: No   Clinical Intake:  Pre-visit preparation completed: Yes  Pain : No/denies pain Pain Score: 0-No pain     BMI - recorded: 29.98 Nutritional Status: BMI 25 -29 Overweight Nutritional Risks: None Diabetes: No  How often do you need to have someone help you when you read instructions, pamphlets, or other written materials from your doctor or pharmacy?: 1 - Never What is the last grade level you completed in school?: HSG  Diabetic? no  Interpreter Needed?: No  Information entered by :: Larrisa Cravey N. Rotunda Worden, LPN   Activities of Daily Living In your present state of health, do you have any difficulty performing the following activities: 03/21/2020  Hearing? N  Vision? N  Difficulty concentrating or making decisions? N  Walking or climbing stairs? N  Dressing or bathing? N  Doing errands, shopping? N  Preparing Food and eating ? N  Using the Toilet? N  In the past six months, have you accidently leaked urine? N  Do you have problems with loss of bowel control? N  Managing your Medications? N  Managing your Finances? N  Housekeeping or managing your Housekeeping? N  Some recent data might be hidden    Patient Care Team: Corwin Levins, MD as PCP - General (Internal Medicine) Myrtie Neither, Andreas Blower, MD as Consulting Physician (Gastroenterology) Comer, Belia Heman, MD as Consulting Physician (Infectious Diseases) Christia Reading, MD as Consulting Physician  (Otolaryngology)  Indicate any recent Medical Services you may have received from other than [facility] providers in the past year (date may be approximate).     Assessment:   This is a routine wellness examination for [place].  Hearing/Vision screen No exam data present  Dietary issues and exercise activities discussed: Current Exercise Habits: Structured exercise class (Patient has a strenuous job; goes to gym and participates in [employer]), Type of exercise: strength training/weights;treadmill;walking, Time (Minutes): 60, Frequency (Times/Week): 7, Weekly Exercise (Minutes/Week): 420, Intensity: Moderate, Exercise limited by: None identified  Goals    .  Patient Stated      I want to lose weight by increasing my physical activity and use portion control.    Marland Kitchen  Patient Stated (pt-stated)      I would like to lose weight and get down to 185 pounds.      Depression Screen PHQ 2/9 Scores 03/07/2020 03/07/2019 03/07/2019 12/12/2017 11/22/2016 08/27/2015  PHQ - 2 Score 0 0 0 0 0 0    Fall Risk Fall Risk  03/21/2020 03/07/2020 03/07/2019 03/07/2019 12/12/2017  Falls in the past year? 0 0 0 0 No  Number falls in past yr: 0 - 0 - -  Injury with Fall? 0 - - - -  Risk for fall due to : No Fall Risks - - - -  Follow up Falls evaluation completed;Education provided - - - -    Any stairs in or around the home? No  If so, are there any without handrails? No  Home free of loose throw rugs in walkways, pet beds, electrical cords, etc? Yes  Adequate lighting in your home to reduce risk of falls? Yes   ASSISTIVE DEVICES UTILIZED TO PREVENT FALLS:  Life alert? No  Use of a cane, walker or w/c? No  Grab bars in the bathroom? No  Shower chair or bench in shower? No  Elevated toilet seat or a handicapped toilet? No   TIMED UP AND GO:  Was the test performed? No .  Length of time to ambulate 10 feet: 0 sec.   Gait steady and fast without use of assistive device  Cognitive Function:     6CIT  Screen 03/21/2020  What Year? 0 points  What month? 0 points  What time? 0 points  Count back from 20 0 points  Months in reverse 0 points  Repeat phrase 0 points  Total Score 0    Immunizations Immunization History  Administered Date(s) Administered  . Fluad Quad(high Dose 65+) 06/27/2019  . Influenza Whole 08/15/2009  . Influenza, High Dose Seasonal PF 07/11/2018  . Influenza,inj,Quad PF,6+ Mos 08/27/2015  . Pneumococcal Conjugate-13 12/12/2017  . Pneumococcal Polysaccharide-23 03/07/2019  . Td 10/21/2009  . Tdap 03/07/2020  . Unspecified SARS-COV-2 Vaccination 10/15/2019, 11/08/2019  . Zoster Recombinat (Shingrix) 03/06/2018    TDAP status: Up to date Flu Vaccine status: Up to date Pneumococcal vaccine status: Up to date Covid-19 vaccine status: Completed vaccines  Qualifies for Shingles Vaccine? Yes   Zostavax completed Yes   Shingrix Completed?: No.    Education has been provided regarding the importance of this vaccine. Patient has been advised to call insurance company to determine out of pocket expense if they have not yet received this vaccine. Advised may also receive vaccine at local pharmacy or Health Dept. Verbalized acceptance and understanding.  Screening Tests Health Maintenance  Topic Date Due  . INFLUENZA VACCINE  04/13/2020  . COLONOSCOPY  06/12/2024  . TETANUS/TDAP  03/07/2030  . COVID-19 Vaccine  Completed  . Hepatitis C Screening  Completed  . PNA vac Low Risk Adult  Completed    Health Maintenance  There are no preventive care reminders to display for this patient.  Colorectal cancer screening: Completed 06/12/2014. Repeat every 10 years  Lung Cancer Screening: (Low Dose CT Chest recommended if Age 37-80 years, 30 pack-year currently smoking OR have quit w/in 15years.) does qualify.   Lung Cancer Screening Referral: no  Additional Screening:  Hepatitis C Screening: does qualify; Completed yes  Vision Screening: Recommended annual  ophthalmology exams for early detection of glaucoma and other disorders of the eye. Is the patient up to date with their annual eye exam?  Yes  Who is the  provider or what is the name of the office in which the patient attends annual eye exams? [facility]  If pt is not established with a provider, would they like to be referred to a provider to establish care? No .   Dental Screening: Recommended annual dental exams for proper oral hygiene  Community Resource Referral / Chronic Care Management: CRR required this visit?  No   CCM required this visit?  No      Plan:     I have personally reviewed and noted the following in the patient's chart:   . Medical and social history . Use of alcohol, tobacco or illicit drugs  . Current medications and supplements . Functional ability and status . Nutritional status . Physical activity . Advanced directives . List of other physicians . Hospitalizations, surgeries, and ER visits in previous 12 months . Vitals . Screenings to include cognitive, depression, and falls . Referrals and appointments  In addition, I have reviewed and discussed with patient certain preventive protocols, quality metrics, and best practice recommendations. A written personalized care plan for preventive services as well as general preventive health recommendations were provided to patient.     Mickeal Needy, LPN   02/15/3328   Nurse Notes: n/a

## 2020-03-21 NOTE — Patient Instructions (Signed)
Christopher Mcfarland , Thank you for taking time to come for your Medicare Wellness Visit. I appreciate your ongoing commitment to your health goals. Please review the following plan we discussed and let me know if I can assist you in the future.   Screening recommendations/referrals: Colonoscopy: 06/12/2014; due every 10 years  Recommended yearly ophthalmology/optometry visit for glaucoma screening and checkup Recommended yearly dental visit for hygiene and checkup  Vaccinations: Influenza vaccine: 06/27/2019 Pneumococcal vaccine: completed Tdap vaccine: 03/07/2020 Shingles vaccine: never done   Covid-19: completed  Advanced directives: Advance directive discussed with you today. Even though you declined this today please call our office should you change your mind and we can give you the proper paperwork for you to fill out.   Conditions/risks identified: Please continue to do your personal lifestyle choices by: daily care of teeth and gums, regular physical activity (goal should be 5 days a week for 30 minutes), eat a healthy diet, avoid tobacco and drug use, limiting any alcohol intake, taking a low-dose aspirin (if not allergic or have been advised by your provider otherwise) and taking vitamins and minerals as recommended by your provider. Continue doing brain stimulating activities (puzzles, reading, adult coloring books, staying active) to keep memory sharp. Continue to eat heart healthy diet (full of fruits, vegetables, whole grains, lean protein, water--limit salt, fat, and sugar intake) and increase physical activity as tolerated.  Next appointment: Please schedule your next Medicare Wellness Visit with your Nurse Health Advisor in 1 year.  Preventive Care 71 Years and Older, Male Preventive care refers to lifestyle choices and visits with your health care provider that can promote health and wellness. What does preventive care include?  A yearly physical exam. This is also called an  annual well check.  Dental exams once or twice a year.  Routine eye exams. Ask your health care provider how often you should have your eyes checked.  Personal lifestyle choices, including:  Daily care of your teeth and gums.  Regular physical activity.  Eating a healthy diet.  Avoiding tobacco and drug use.  Limiting alcohol use.  Practicing safe sex.  Taking low doses of aspirin every day.  Taking vitamin and mineral supplements as recommended by your health care provider. What happens during an annual well check? The services and screenings done by your health care provider during your annual well check will depend on your age, overall health, lifestyle risk factors, and family history of disease. Counseling  Your health care provider may ask you questions about your:  Alcohol use.  Tobacco use.  Drug use.  Emotional well-being.  Home and relationship well-being.  Sexual activity.  Eating habits.  History of falls.  Memory and ability to understand (cognition).  Work and work Astronomer. Screening  You may have the following tests or measurements:  Height, weight, and BMI.  Blood pressure.  Lipid and cholesterol levels. These may be checked every 5 years, or more frequently if you are over 38 years old.  Skin check.  Lung cancer screening. You may have this screening every year starting at age 55 if you have a 30-pack-year history of smoking and currently smoke or have quit within the past 15 years.  Fecal occult blood test (FOBT) of the stool. You may have this test every year starting at age 12.  Flexible sigmoidoscopy or colonoscopy. You may have a sigmoidoscopy every 5 years or a colonoscopy every 10 years starting at age 90.  Prostate cancer screening. Recommendations will vary  depending on your family history and other risks.  Hepatitis C blood test.  Hepatitis B blood test.  Sexually transmitted disease (STD) testing.  Diabetes  screening. This is done by checking your blood sugar (glucose) after you have not eaten for a while (fasting). You may have this done every 1-3 years.  Abdominal aortic aneurysm (AAA) screening. You may need this if you are a current or former smoker.  Osteoporosis. You may be screened starting at age 62 if you are at high risk. Talk with your health care provider about your test results, treatment options, and if necessary, the need for more tests. Vaccines  Your health care provider may recommend certain vaccines, such as:  Influenza vaccine. This is recommended every year.  Tetanus, diphtheria, and acellular pertussis (Tdap, Td) vaccine. You may need a Td booster every 10 years.  Zoster vaccine. You may need this after age 45.  Pneumococcal 13-valent conjugate (PCV13) vaccine. One dose is recommended after age 46.  Pneumococcal polysaccharide (PPSV23) vaccine. One dose is recommended after age 21. Talk to your health care provider about which screenings and vaccines you need and how often you need them. This information is not intended to replace advice given to you by your health care provider. Make sure you discuss any questions you have with your health care provider. Document Released: 09/26/2015 Document Revised: 05/19/2016 Document Reviewed: 07/01/2015 Elsevier Interactive Patient Education  2017 [place] Prevention in the Home Falls can cause injuries. They can happen to people of all ages. There are many things you can do to make your home safe and to help prevent falls. What can I do on the outside of my home?  Regularly fix the edges of walkways and driveways and fix any cracks.  Remove anything that might make you trip as you walk through a door, such as a raised step or threshold.  Trim any bushes or trees on the path to your home.  Use bright outdoor lighting.  Clear any walking paths of anything that might make someone trip, such as rocks or  tools.  Regularly check to see if handrails are loose or broken. Make sure that both sides of any steps have handrails.  Any raised decks and porches should have guardrails on the edges.  Have any leaves, snow, or ice cleared regularly.  Use sand or salt on walking paths during winter.  Clean up any spills in your garage right away. This includes oil or grease spills. What can I do in the bathroom?  Use night lights.  Install grab bars by the toilet and in the tub and shower. Do not use towel bars as grab bars.  Use non-skid mats or decals in the tub or shower.  If you need to sit down in the shower, use a plastic, non-slip stool.  Keep the floor dry. Clean up any water that spills on the floor as soon as it happens.  Remove soap buildup in the tub or shower regularly.  Attach bath mats securely with double-sided non-slip rug tape.  Do not have throw rugs and other things on the floor that can make you trip. What can I do in the bedroom?  Use night lights.  Make sure that you have a light by your bed that is easy to reach.  Do not use any sheets or blankets that are too big for your bed. They should not hang down onto the floor.  Have a firm chair that has side  arms. You can use this for support while you get dressed.  Do not have throw rugs and other things on the floor that can make you trip. What can I do in the kitchen?  Clean up any spills right away.  Avoid walking on wet floors.  Keep items that you use a lot in easy-to-reach places.  If you need to reach something above you, use a strong step stool that has a grab bar.  Keep electrical cords out of the way.  Do not use floor polish or wax that makes floors slippery. If you must use wax, use non-skid floor wax.  Do not have throw rugs and other things on the floor that can make you trip. What can I do with my stairs?  Do not leave any items on the stairs.  Make sure that there are handrails on both  sides of the stairs and use them. Fix handrails that are broken or loose. Make sure that handrails are as long as the stairways.  Check any carpeting to make sure that it is firmly attached to the stairs. Fix any carpet that is loose or worn.  Avoid having throw rugs at the top or bottom of the stairs. If you do have throw rugs, attach them to the floor with carpet tape.  Make sure that you have a light switch at the top of the stairs and the bottom of the stairs. If you do not have them, ask someone to add them for you. What else can I do to help prevent falls?  Wear shoes that:  Do not have high heels.  Have rubber bottoms.  Are comfortable and fit you well.  Are closed at the toe. Do not wear sandals.  If you use a stepladder:  Make sure that it is fully opened. Do not climb a closed stepladder.  Make sure that both sides of the stepladder are locked into place.  Ask someone to hold it for you, if possible.  Clearly mark and make sure that you can see:  Any grab bars or handrails.  First and last steps.  Where the edge of each step is.  Use tools that help you move around (mobility aids) if they are needed. These include:  Canes.  Walkers.  Scooters.  Crutches.  Turn on the lights when you go into a dark area. Replace any light bulbs as soon as they burn out.  Set up your furniture so you have a clear path. Avoid moving your furniture around.  If any of your floors are uneven, fix them.  If there are any pets around you, be aware of where they are.  Review your medicines with your doctor. Some medicines can make you feel dizzy. This can increase your chance of falling. Ask your doctor what other things that you can do to help prevent falls. This information is not intended to replace advice given to you by your health care provider. Make sure you discuss any questions you have with your health care provider. Document Released: 06/26/2009 Document Revised:  02/05/2016 Document Reviewed: 10/04/2014 Elsevier Interactive Patient Education  2017 [employer].

## 2020-04-03 ENCOUNTER — Telehealth: Payer: Self-pay | Admitting: Family Medicine

## 2020-04-03 NOTE — Telephone Encounter (Signed)
Pt called, at last visit pt stated that Dr. Denyse Amass added Mitigare to his allopurinal. I do not see this on the med list, but pt confirms.  Pt recalls that he was supposed to take BOTH medicines for 90 days, then only take 1 of them. He is confused as to which one to continue taking.

## 2020-04-04 NOTE — Telephone Encounter (Signed)
Called Christopher Mcfarland and relayed Dr. Zollie Pee advice to take the cholchicine for the first 90 days and to take Allopurinol daily for the rest of his life essentially.  Christopher Mcfarland verbalizes understanding.

## 2020-04-04 NOTE — Telephone Encounter (Signed)
After 90 days take the allopurinol daily. Take the colchicine (mitigare) only as needed for gout flair.

## 2020-04-08 ENCOUNTER — Ambulatory Visit (INDEPENDENT_AMBULATORY_CARE_PROVIDER_SITE_OTHER): Payer: Medicare HMO | Admitting: Internal Medicine

## 2020-04-08 ENCOUNTER — Encounter: Payer: Self-pay | Admitting: Internal Medicine

## 2020-04-08 ENCOUNTER — Other Ambulatory Visit: Payer: Self-pay

## 2020-04-08 VITALS — BP 130/70 | HR 87 | Temp 98.3°F | Ht 68.0 in | Wt 196.0 lb

## 2020-04-08 DIAGNOSIS — E782 Mixed hyperlipidemia: Secondary | ICD-10-CM | POA: Diagnosis not present

## 2020-04-08 DIAGNOSIS — R739 Hyperglycemia, unspecified: Secondary | ICD-10-CM

## 2020-04-08 DIAGNOSIS — Z8739 Personal history of other diseases of the musculoskeletal system and connective tissue: Secondary | ICD-10-CM | POA: Diagnosis not present

## 2020-04-08 DIAGNOSIS — M109 Gout, unspecified: Secondary | ICD-10-CM | POA: Diagnosis not present

## 2020-04-08 DIAGNOSIS — E559 Vitamin D deficiency, unspecified: Secondary | ICD-10-CM | POA: Diagnosis not present

## 2020-04-08 DIAGNOSIS — E639 Nutritional deficiency, unspecified: Secondary | ICD-10-CM

## 2020-04-08 NOTE — Patient Instructions (Signed)
You will be contacted regarding the referral for: nutrition  Please continue all other medications as before, and refills have been done if requested.  Please have the pharmacy call with any other refills you may need.  Please continue your efforts at being more active, low cholesterol diet, and weight control  Please keep your appointments with your specialists as you may have planned

## 2020-04-08 NOTE — Progress Notes (Signed)
Subjective:    Patient ID: Christopher Mcfarland, male    DOB: 02-12-1946, 74 y.o.   MRN: [id number]  HPI  Here to f/u; overall doing ok,  Pt denies chest pain, increasing sob or doe, wheezing, orthopnea, PND, increased LE swelling, palpitations, dizziness or syncope.  Pt denies new neurological symptoms such as new headache, or facial or extremity weakness or numbness.  Pt denies polydipsia, polyuria, or low sugar episode.  Pt states overall good compliance with meds, mostly trying to follow appropriate diet, with wt overall stable,  but little exercise however. Also has hx of gout, no attacks since April 2021 but very much wants to avoid if he can Past Medical History:  Diagnosis Date  . Essential hypertension   . Gout 01/02/2020   Gout crystals on joint fluid April 2021  . Hepatitis C 2003   Treated  . Hyperlipidemia    Past Surgical History:  Procedure Laterality Date  . CARPAL TUNNEL RELEASE     bilat 1999  . HEMORRHOID SURGERY     1999/4 polyps removed rectum  . TRIGGER FINGER RELEASE     right hand 1999    reports that he has never smoked. He has never used smokeless tobacco. He reports previous alcohol use of about 2.0 - 3.0 standard drinks of alcohol per week. He reports that he does not use drugs. family history includes Bone cancer in his father; CAD (age of onset: 33) in his brother; Kidney cancer in his mother. No Known Allergies Current Outpatient Medications on File Prior to Visit  Medication Sig Dispense Refill  . allopurinol (ZYLOPRIM) 300 MG tablet Take 1 tablet (300 mg total) by mouth daily. 90 tablet 3  . amLODipine (NORVASC) 5 MG tablet TAKE 1 TABLET(5 MG) BY MOUTH DAILY 90 tablet 3  . diclofenac Sodium (VOLTAREN) 1 % GEL Apply 4 g topically 4 (four) times daily. 100 g 0  . ipratropium (ATROVENT) 0.06 % nasal spray Place 2 sprays into both nostrils 3 (three) times daily.    . mupirocin ointment (BACTROBAN) 2 % APPLY WITHIN BOTH NASAL PASSAGES TWICE DAILY FOR 2 WEEKS     . rosuvastatin (CRESTOR) 10 MG tablet TAKE 1 TABLET BY MOUTH EVERY DAy 90 tablet 3  . sildenafil (VIAGRA) 100 MG tablet Take 0.5-1 tablets (50-100 mg total) by mouth daily as needed for erectile dysfunction. 5 tablet 11  . ULTRAM 50 MG tablet Take 50 mg by mouth every 6 (six) hours.     No current facility-administered medications on file prior to visit.   Review of Systems All otherwise neg per pt     Objective:   Physical Exam BP (!) 130/70 (BP Location: Left Arm, Patient Position: Sitting, Cuff Size: Large)   Pulse 87   Temp 98.3 F (36.8 C) (Oral)   Ht 5\' 8"  (1.727 m)   Wt 196 lb (88.9 kg)   SpO2 95%   BMI 29.80 kg/m  VS noted,  Constitutional: Pt appears in NAD HENT: Head: NCAT.  Right Ear: External ear normal.  Left Ear: External ear normal.  Eyes: . Pupils are equal, round, and reactive to light. Conjunctivae and EOM are normal Nose: without d/c or deformity Neck: Neck supple. Gross normal ROM Cardiovascular: Normal rate and regular rhythm.   Pulmonary/Chest: Effort normal and breath sounds without rales or wheezing.  Abd:  Soft, NT, ND, + BS, no organomegaly Neurological: Pt is alert. At baseline orientation, motor grossly intact Skin: Skin is warm. No rashes,  other new lesions, no LE edema Psychiatric: Pt behavior is normal without agitation  All otherwise neg per pt/ Lab Results  Component Value Date   WBC 4.9 03/07/2020   HGB 13.1 (L) 03/07/2020   HCT 40.1 03/07/2020   PLT 173 03/07/2020   GLUCOSE 90 03/07/2020   CHOL 150 03/07/2020   TRIG 86 03/07/2020   HDL 41 03/07/2020   LDLCALC 91 03/07/2020   ALT 24 03/07/2020   AST 24 03/07/2020   NA 141 03/07/2020   K 3.8 03/07/2020   CL 105 03/07/2020   CREATININE 1.09 03/07/2020   BUN 18 03/07/2020   CO2 26 03/07/2020   TSH 1.93 03/07/2020   PSA 0.5 03/07/2020   INR 1.17 08/20/2015   HGBA1C 5.9 (H) 03/07/2020      Assessment & Plan:

## 2020-04-13 ENCOUNTER — Encounter: Payer: Self-pay | Admitting: Internal Medicine

## 2020-04-13 NOTE — Assessment & Plan Note (Addendum)
Ok for nutrition counseling, low purine diet  I spent 31 minutes in preparing to see the patient by review of recent labs, imaging and procedures, obtaining and reviewing separately obtained history, communicating with the patient and family or caregiver, ordering medications, tests or procedures, and documenting clinical information in the EHR including the differential Dx, treatment, and any further evaluation and other management of gout, hyperglycemia, hld, vit d def

## 2020-04-13 NOTE — Assessment & Plan Note (Signed)
stable overall by history and exam, recent data reviewed with pt, and pt to continue medical treatment as before,  to f/u any worsening symptoms or concerns  

## 2020-04-13 NOTE — Assessment & Plan Note (Signed)
Cont oral replacement 

## 2020-04-14 ENCOUNTER — Ambulatory Visit: Payer: Self-pay

## 2020-04-14 ENCOUNTER — Ambulatory Visit (INDEPENDENT_AMBULATORY_CARE_PROVIDER_SITE_OTHER): Payer: Medicare HMO | Admitting: Family Medicine

## 2020-04-14 ENCOUNTER — Encounter: Payer: Self-pay | Admitting: Family Medicine

## 2020-04-14 ENCOUNTER — Other Ambulatory Visit: Payer: Self-pay

## 2020-04-14 VITALS — BP 120/74 | HR 101 | Ht 68.0 in | Wt 196.0 lb

## 2020-04-14 DIAGNOSIS — M25562 Pain in left knee: Secondary | ICD-10-CM

## 2020-04-14 MED ORDER — COLCHICINE 0.6 MG PO CAPS: 1.0000 | ORAL_CAPSULE | Freq: Every day | ORAL | 1 refills | Status: DC | PRN

## 2020-04-14 NOTE — Progress Notes (Signed)
I, [name], LAT, ATC, am serving as scribe for Dr. Clementeen Graham.  Christopher Mcfarland is a 74 y.o. male who presents to [facility] today for f/u of L knee pain.  He was last seen by Dr. Denyse Amass on 12/26/19 after suffering a fall in early April 2021.  He had a L knee injection at his last visit.  Since his last visit, pt reports increased L knee pain and swelling since last Friday.  He reports radiating pain into his L post knee.   Diagnostic imaging: L knee XR- 12/24/19  Pertinent review of systems: No fevers or chills  Relevant historical information: History of gout.  Currently taking allopurinol last uric acid measured less than 5 in June.   Exam:  BP 120/74 (BP Location: Left Arm, Patient Position: Sitting, Cuff Size: Normal)   Pulse 101   Ht 5\' 8"  (1.727 m)   Wt 196 lb (88.9 kg)   SpO2 97%   BMI 29.80 kg/m  General: Well Developed, well nourished, and in no acute distress.   MSK: Left knee large effusion no erythema.  Decreased range of motion 10-100 degrees. Significant antalgic gait.  Intact strength.    Lab and Radiology Results  Diagnostic Limited MSK Ultrasound of: Left knee Quad tendon intact large effusion present superior patellar space. Patellar tendon normal-appearing Posterior knee small Baker's cyst. Impression: Large knee effusion   Procedure: Real-time Ultrasound Guided aspiration and injection of left knee Device: Philips Affiniti 50G Images permanently stored and available for review in the ultrasound unit. Verbal informed consent obtained.  Discussed risks and benefits of procedure. Warned about infection bleeding damage to structures skin hypopigmentation and fat atrophy among others. Patient expresses understanding and agreement Time-out conducted.   Noted no overlying erythema, induration, or other signs of local infection.   Skin prepped in a sterile fashion.   Local anesthesia: Topical Ethyl chloride.   With  sterile technique and under real time ultrasound guidance:  2 mL of lidocaine injected achieving good anesthesia. 18-gauge needle was used to access the superior patellar space and 45 mL of cloudy yellow fluid aspirated. Syringe was exchanged and 40 mg of Kenalog and 2 mils of Marcaine were injected.   Completed without difficulty   Pain partially resolved suggesting accurate placement of the medication.   Advised to call if fevers/chills, erythema, induration, drainage, or persistent bleeding.   Images permanently stored and available for review in the ultrasound unit.  Impression: Technically successful ultrasound guided injection.       Assessment and Plan: 74 y.o. male with left knee pain due to gout versus exacerbation of DJD.  Uric acid when last checked was reasonably well controlled.  Patient had similar episode in April.  At that visit fluid was sent for culture and cell count differential which was largely normal.  I do not think it is necessary to send today's fluid aspirate to the lab. Additionally will refill colchicine so he can start taking that as that size been helpful in the past.  Recheck back with me as needed.  PDMP not reviewed this encounter. Orders Placed This Encounter  Procedures  . May LIMITED JOINT SPACE STRUCTURES LOW LEFT(NO LINKED CHARGES)    Order Specific Question:   Reason for Exam (SYMPTOM  OR DIAGNOSIS REQUIRED)    Answer:   L knee pain    Order Specific Question:   Preferred imaging location?    Answer:   [facility]  Meds ordered this encounter  Medications  . Colchicine 0.6 MG CAPS    Sig: Take 1 capsule by mouth daily as needed (gout).    Dispense:  90 capsule    Refill:  1     Discussed warning signs or symptoms. Please see discharge instructions. Patient expresses understanding.   The above documentation has been reviewed and is accurate and complete Clementeen Graham, M.D.

## 2020-04-14 NOTE — Patient Instructions (Addendum)
You had a L knee injection today.  Call or go to the ER if you develop a large red swollen joint with extreme pain or oozing puss.   Continue the allopurinol.  Add colchicine as needed daily.   Keep me updated if not better.

## 2020-05-22 ENCOUNTER — Other Ambulatory Visit: Payer: Self-pay

## 2020-05-22 ENCOUNTER — Encounter: Payer: Self-pay | Admitting: Dietician

## 2020-05-22 ENCOUNTER — Encounter: Payer: Medicare HMO | Attending: Internal Medicine | Admitting: Dietician

## 2020-05-22 DIAGNOSIS — E639 Nutritional deficiency, unspecified: Secondary | ICD-10-CM | POA: Diagnosis not present

## 2020-05-22 DIAGNOSIS — M109 Gout, unspecified: Secondary | ICD-10-CM | POA: Diagnosis not present

## 2020-05-22 DIAGNOSIS — Z713 Dietary counseling and surveillance: Secondary | ICD-10-CM | POA: Insufficient documentation

## 2020-05-22 NOTE — Progress Notes (Signed)
Medical Nutrition Therapy   Primary concerns today: gout  Referral diagnosis: Z87.39- hx of gout; E63.9- disorder of nutrition Preferred learning style: no preference indicated Learning readiness: change in progress   NUTRITION ASSESSMENT   Lifestyle & Dietary Hx Patient follows a low sodium, low cholesterol diet. Due to gout, states he stopped eating pork, roast, steaks, tuna, sardines, most fish. Eats 1 egg per week, and avoids milk and cheese.   24-Hr Dietary Recall First Meal: chicken biscuit Snack: - Second Meal: - Snack: - Third Meal: salad  Snack: - Beverages: coffee, water   NUTRITION DIAGNOSIS  Predicted excessive nutrient (purine) intake (NI-5.11.2) related to no prior nutrition education as evidenced by gout.    NUTRITION INTERVENTION  Nutrition education (E-1) on the following topics:  . Nutrition therapy for gout   Handouts Provided Include   Low-Purine Nutrition Therapy   Learning Style & Readiness for Change Teaching method utilized: Visual & Auditory  Demonstrated degree of understanding via: Teach Back  Barriers to learning/adherence to lifestyle change: None Identified    MONITORING & EVALUATION Dietary intake, weekly physical activity, and goals PRN.  Next Steps  Patient is to contact [facility] as needed for follow up or with questions/ concerns.

## 2020-06-20 IMAGING — DX DG HAND COMPLETE 3+V*R*
3 series · 3 of 3 positions shown · non-contrast
Comparison: June 10, 2017.

CLINICAL DATA: Acute right thumb pain and swelling without known
injury.

EXAM:
RIGHT HAND - COMPLETE 3+ VIEW

[hand ap]
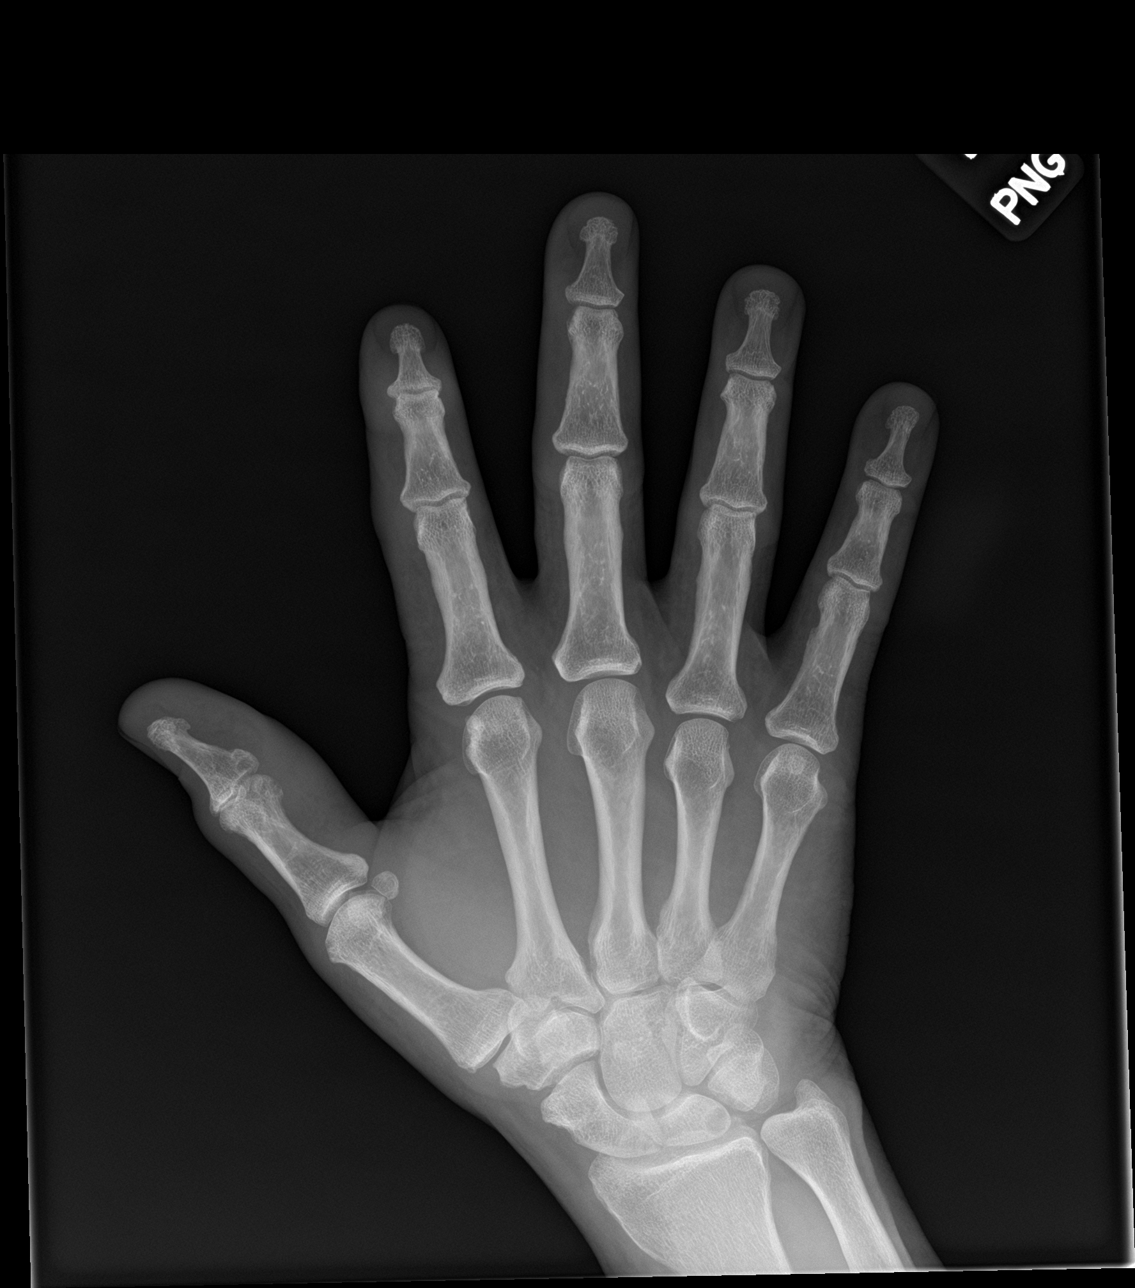

[hand obl]
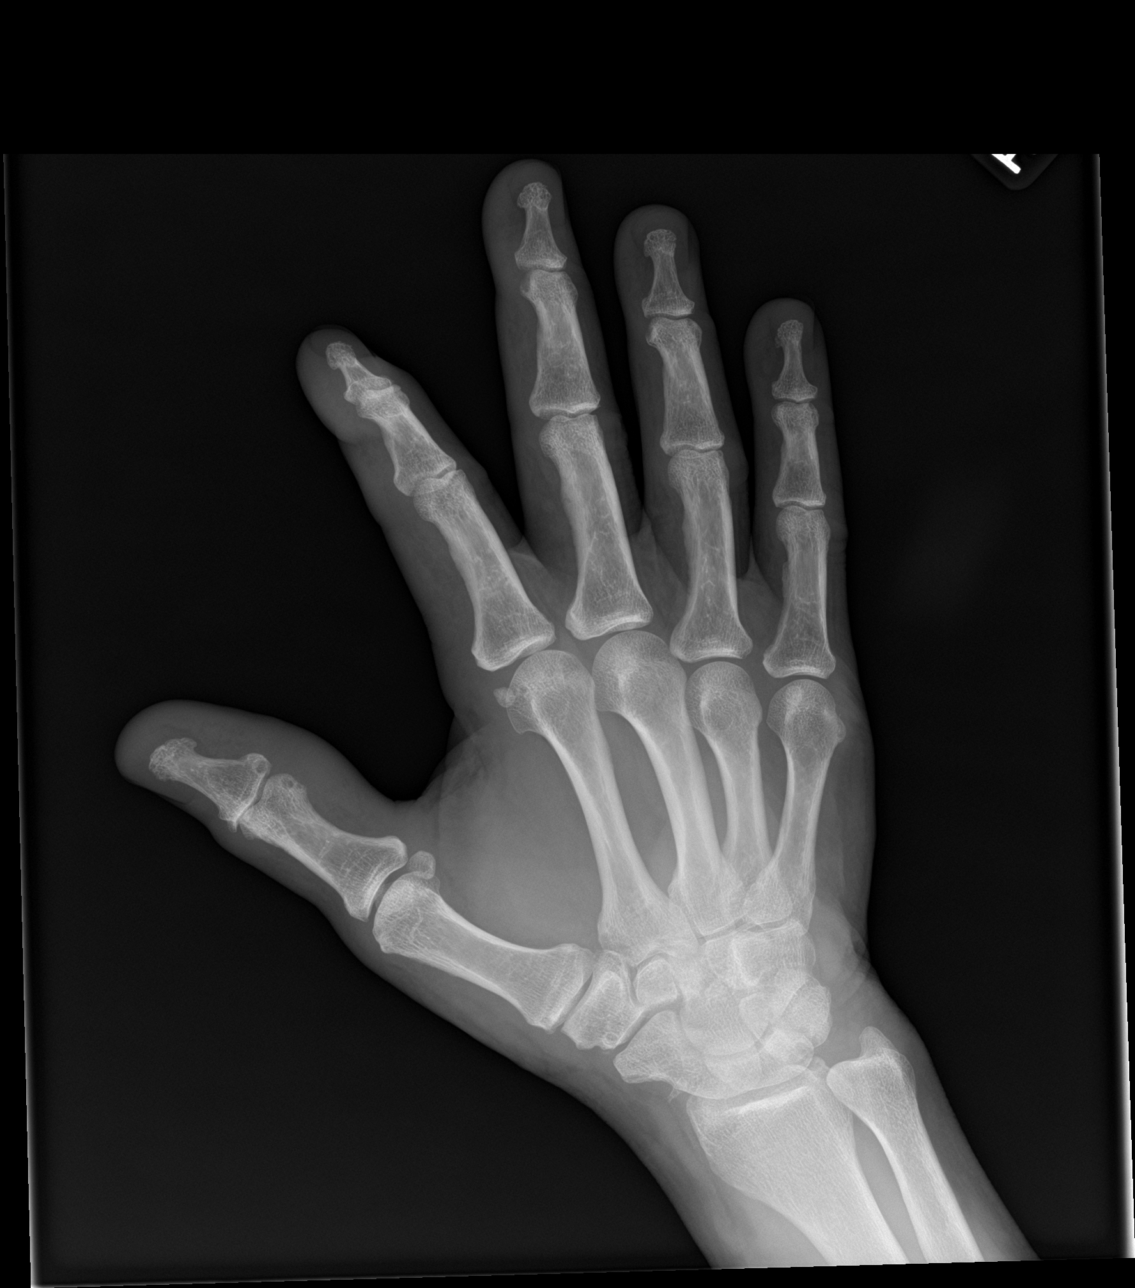

[hand lat]
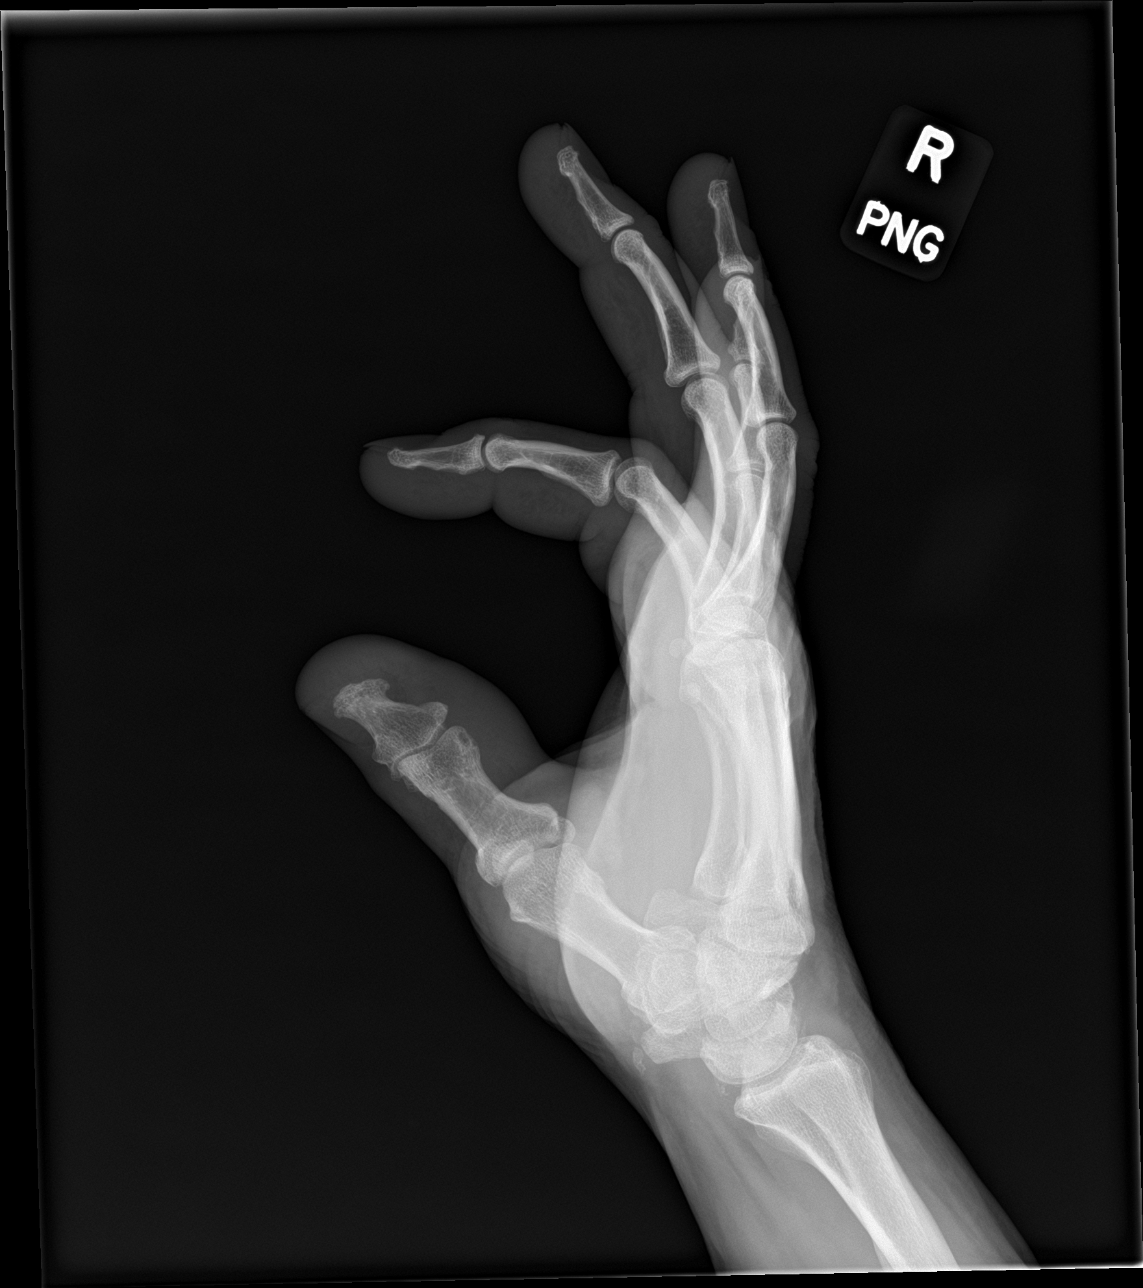

[3 of 3 positions shown; findings below may reference images not displayed]

FINDINGS: There is no evidence of fracture or dislocation. There is no
evidence of arthropathy or other focal bone abnormality. Soft
tissues are unremarkable.
IMPRESSION: Negative.

## 2020-09-19 ENCOUNTER — Encounter: Payer: Self-pay | Admitting: Internal Medicine

## 2020-09-19 ENCOUNTER — Other Ambulatory Visit: Payer: Self-pay

## 2020-09-19 ENCOUNTER — Ambulatory Visit (INDEPENDENT_AMBULATORY_CARE_PROVIDER_SITE_OTHER): Payer: Medicare HMO | Admitting: Internal Medicine

## 2020-09-19 VITALS — BP 128/82 | HR 102 | Temp 98.4°F | Ht 68.0 in | Wt 207.0 lb

## 2020-09-19 DIAGNOSIS — E782 Mixed hyperlipidemia: Secondary | ICD-10-CM

## 2020-09-19 DIAGNOSIS — R739 Hyperglycemia, unspecified: Secondary | ICD-10-CM

## 2020-09-19 DIAGNOSIS — M65332 Trigger finger, left middle finger: Secondary | ICD-10-CM | POA: Diagnosis not present

## 2020-09-19 DIAGNOSIS — I10 Essential (primary) hypertension: Secondary | ICD-10-CM | POA: Diagnosis not present

## 2020-09-19 MED ORDER — PREDNISONE 10 MG PO TABS: ORAL_TABLET | ORAL | 0 refills | Status: DC

## 2020-09-19 NOTE — Patient Instructions (Signed)
Please take all new medication as prescribed - the prednisone  You will be contacted regarding the referral for: hand surgury  Please continue all other medications as before, and refills have been done if requested.  Please have the pharmacy call with any other refills you may need.  Please keep your appointments with your specialists as you may have planned

## 2020-09-20 ENCOUNTER — Encounter: Payer: Self-pay | Admitting: Internal Medicine

## 2020-09-20 DIAGNOSIS — M65332 Trigger finger, left middle finger: Secondary | ICD-10-CM | POA: Insufficient documentation

## 2020-09-20 NOTE — Progress Notes (Signed)
Established Patient Office Visit  Subjective:  Patient ID: Christopher Mcfarland, male    DOB: 08/17/1946  Age: 75 y.o. MRN: [id number]      Chief Complaint: (concise statement describing the symptom, problem, condition, diagnosis, physician recommended return, or other factor as reason for encounter) follow up HTN, HLD and hyperglycemia and worsening left middle finger trigger finger       HPI:  Christopher Mcfarland is a 75 y.o. 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  Pt denies chest pain, increased sob or doe, wheezing, orthopnea, PND, increased LE swelling, palpitations, dizziness or syncope.   Pt denies polydipsia, polyuria, or low sugar symptoms  Pt states overall good compliance with meds, trying to follow lower cholesterol, diabetic diet, .        Wt Readings from Last 3 Encounters:  09/19/20 207 lb (93.9 kg)  04/14/20 196 lb (88.9 kg)  04/08/20 196 lb (88.9 kg)   BP Readings from Last 3 Encounters:  09/19/20 128/82  04/14/20 120/74  04/08/20 (!) 130/70         Past Medical History:  Diagnosis Date  . Essential hypertension   . Gout 01/02/2020   Gout crystals on joint fluid April 2021  . Hepatitis C 2003   Treated  . Hyperlipidemia    Past Surgical History:  Procedure Laterality Date  . CARPAL TUNNEL RELEASE     bilat 1999  . HEMORRHOID SURGERY     1999/4 polyps removed rectum  . TRIGGER FINGER RELEASE     right hand 1999    reports that he has never smoked. He has never used smokeless tobacco. He reports previous alcohol use of about 2.0 - 3.0 standard drinks of alcohol per week. He reports that he does not use drugs. family history includes Bone cancer in his father; CAD (age of onset: 17) in his brother; Kidney cancer in his mother. No Known  Allergies Current Outpatient Medications on File Prior to Visit  Medication Sig Dispense Refill  . allopurinol (ZYLOPRIM) 300 MG tablet Take 1 tablet (300 mg total) by mouth daily. 90 tablet 3  . amLODipine (NORVASC) 5 MG tablet TAKE 1 TABLET(5 MG) BY MOUTH DAILY 90 tablet 3  . Colchicine 0.6 MG CAPS Take 1 capsule by mouth daily as needed (gout). 90 capsule 1  . diclofenac Sodium (VOLTAREN) 1 % GEL Apply 4 g topically 4 (four) times daily. 100 g 0  . ipratropium (ATROVENT) 0.06 % nasal spray Place 2 sprays into both nostrils 3 (three) times daily.    . mupirocin ointment (BACTROBAN) 2 % APPLY WITHIN BOTH NASAL PASSAGES TWICE DAILY FOR 2 WEEKS    . rosuvastatin (CRESTOR) 10 MG tablet TAKE 1 TABLET BY MOUTH EVERY DAy 90 tablet 3  . sildenafil (VIAGRA) 100 MG tablet Take 0.5-1 tablets (50-100 mg total) by mouth daily as needed for erectile dysfunction. 5 tablet 11  . ULTRAM 50 MG tablet Take 50 mg by mouth every 6 (six) hours.     No current facility-administered medications on file prior to visit.        ROS:  All others reviewed and negative.  Objective        PE:  BP 128/82   Pulse (!) 102   Temp  98.4 F (36.9 C)   Ht 5\' 8"  (1.727 m)   Wt 207 lb (93.9 kg)   SpO2 96%   BMI 31.47 kg/m                 Constitutional: Pt appears in NAD               HENT: Head: NCAT.                Right Ear: External ear normal.                 Left Ear: External ear normal.                Eyes: . Pupils are equal, round, and reactive to light. Conjunctivae and EOM are normal               Nose: without d/c or deformity               Neck: Neck supple. Gross normal ROM               Cardiovascular: Normal rate and regular rhythm.                 Pulmonary/Chest: Effort normal and breath sounds without rales or wheezing.                Left hand with middle finger reduced ROM, mod swelling, tender to MCP without overlying skin change               Neurological: Pt is alert. At baseline  orientation, motor grossly intact               Skin: Skin is warm. No rashes, no other new lesions, LE edema - none               Psychiatric: Pt behavior is normal without agitation   Assessment/Plan:  Christopher Mcfarland is a 75 y.o. Black or African American [2] male with  has a past medical history of Essential hypertension, Gout (01/02/2020), Hepatitis C (2003), and Hyperlipidemia.   Assessment Plan  See problem oriented assessment and plan Labs reviewed for each problem: Lab Results  Component Value Date   WBC 4.9 03/07/2020   HGB 13.1 (L) 03/07/2020   HCT 40.1 03/07/2020   PLT 173 03/07/2020   GLUCOSE 90 03/07/2020   CHOL 150 03/07/2020   TRIG 86 03/07/2020   HDL 41 03/07/2020   LDLCALC 91 03/07/2020   ALT 24 03/07/2020   AST 24 03/07/2020   NA 141 03/07/2020   K 3.8 03/07/2020   CL 105 03/07/2020   CREATININE 1.09 03/07/2020   BUN 18 03/07/2020   CO2 26 03/07/2020   TSH 1.93 03/07/2020   PSA 0.5 03/07/2020   INR 1.17 08/20/2015   HGBA1C 5.9 (H) 03/07/2020    Micro: none  Cardiac tracings I have personally interpreted today:  none  Pertinent Radiological findings (summarize): none     Health Maintenance Due  Topic Date Due  . INFLUENZA VACCINE  04/13/2020  . COVID-19 Vaccine (3 - Booster) 05/07/2020    There are no preventive care reminders to display for this patient.  Lab Results  Component Value Date   TSH 1.93 03/07/2020   Lab Results  Component Value Date   WBC 4.9 03/07/2020   HGB 13.1 (L) 03/07/2020   HCT 40.1 03/07/2020   MCV 87.9 03/07/2020   PLT 173 03/07/2020   Lab Results  Component Value Date   NA 141 03/07/2020   K 3.8 03/07/2020   CO2 26 03/07/2020   GLUCOSE 90 03/07/2020   BUN 18 03/07/2020   CREATININE 1.09 03/07/2020   BILITOT 0.5 03/07/2020   ALKPHOS 50 12/24/2019   AST 24 03/07/2020   ALT 24 03/07/2020   PROT 7.3 03/07/2020   ALBUMIN 4.4 12/24/2019   CALCIUM 9.8 03/07/2020   GFR 83.08 12/24/2019   Lab Results   Component Value Date   CHOL 150 03/07/2020   Lab Results  Component Value Date   HDL 41 03/07/2020   Lab Results  Component Value Date   LDLCALC 91 03/07/2020   Lab Results  Component Value Date   TRIG 86 03/07/2020   Lab Results  Component Value Date   CHOLHDL 3.7 03/07/2020   Lab Results  Component Value Date   HGBA1C 5.9 (H) 03/07/2020      Assessment & Plan:   Problem List Items Addressed This Visit      High   Trigger middle finger of left hand - Primary    Worsening recent, for prednisone 20 qd x 5 days, refer hand surgury as did well with similar release to the third finger right hand in past; pt plans to cont work for now, but will retire completely when able to have the treatment done and 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 predniSONE (DELTASONE) 10 MG tablet    Sig: 2 tabs by mouth per day for 5 days    Dispense:  10 tablet    Refill:  0    Follow-up: Return in 3 months (on 12/18/2020), or if symptoms worsen or fail to improve.    Oliver Barre, MD 09/20/2020 3:43 AM [facility]

## 2020-09-20 NOTE — Assessment & Plan Note (Signed)
Lab Results  Component Value Date   HGBA1C 5.9 (H) 03/07/2020  stable, does not require further OHA at this time

## 2020-09-20 NOTE — Assessment & Plan Note (Addendum)
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

## 2020-09-20 NOTE — Assessment & Plan Note (Signed)
BP Readings from Last 3 Encounters:  09/19/20 128/82  04/14/20 120/74  04/08/20 (!) 130/70  stable, to continue current med tx - amlodipine

## 2020-09-20 NOTE — Assessment & Plan Note (Signed)
Lab Results  Component Value Date   LDLCALC 91 03/07/2020  stable, to cont statin crestor 10 mg

## 2020-10-01 ENCOUNTER — Other Ambulatory Visit: Payer: Self-pay

## 2020-10-01 ENCOUNTER — Ambulatory Visit (INDEPENDENT_AMBULATORY_CARE_PROVIDER_SITE_OTHER): Payer: Medicare HMO | Admitting: Orthopedic Surgery

## 2020-10-01 DIAGNOSIS — M65842 Other synovitis and tenosynovitis, left hand: Secondary | ICD-10-CM

## 2020-10-03 ENCOUNTER — Encounter: Payer: Self-pay | Admitting: Orthopedic Surgery

## 2020-10-03 NOTE — Progress Notes (Signed)
Office Visit Note   Patient: Christopher Mcfarland           Date of Birth: February 25, 1946           MRN: [id number] Visit Date: 10/01/2020 Requested by: Corwin Levins, MD [address] [place],  [place] [address] PCP: Corwin Levins, MD  Subjective: Chief Complaint  Patient presents with   Other     Trigger finger    HPI: Christopher Mcfarland is a 75 year old patient with left hand pain. Describes triggering in the middle finger along with stiffness of the PIP joint. He states "I need to get it released". Had the same thing done on his right hand back in 1999 and he did well with that. Does describe tenderness to palpation in the palm of the hand. Has tried taking some over-the-counter medication and stretching without much relief.              ROS: All systems reviewed are negative as they relate to the chief complaint within the history of present illness.  Patient denies  fevers or chills.   Assessment & Plan: Visit Diagnoses:  1. Stenosing tenosynovitis of finger of left hand     Plan: Impression is left hand long trigger finger with almost daily catching. In addition the patient does have some PIP arthritis resulting in a fixed flexion contracture. Although his right 1 did well after surgery with similar circumstances I did tell Sharon Seller that it is very unlikely his left hand PIP joint will straighten after release of the triggering. Digit. Patient understands and the risk and benefits are discussed with him including not limited to infection nerve vessel damage stiffness. Expected rehabilitation time is also discussed. All questions answered.  Follow-Up Instructions: No follow-ups on file.   Orders:  No orders of the defined types were placed in this encounter.  No orders of the defined types were placed in this encounter.     Procedures: No procedures performed   Clinical Data: No additional findings.  Objective: Vital Signs: There were no vitals taken for this visit.  Physical Exam:    Constitutional: Patient appears well-developed HEENT:  Head: Normocephalic Eyes:EOM are normal Neck: Normal range of motion Cardiovascular: Normal rate Pulmonary/chest: Effort normal Neurologic: Patient is alert Skin: Skin is warm Psychiatric: Patient has normal mood and affect    Ortho Exam: Ortho exam demonstrates tenderness over the A1 pulley left hand. Has about a 10 degree fixed flexion contracture at the PIP joint with intact extensor function and flexor function at the PIP and DIP joint. Patient has about 90 degrees of flexion at the PIP joint third finger left hand. Collaterals are stable.  Specialty Comments:  No specialty comments available.  Imaging: No results found.   PMFS History: Patient Active Problem List   Diagnosis Date Noted   Trigger middle finger of left hand 09/20/2020   Gout 01/02/2020   Right lumbar radiculitis 07/23/2019   Neck pain, musculoskeletal 06/27/2019   Erectile dysfunction 03/11/2019   Vitamin D deficiency 03/11/2019   Hyperglycemia 03/07/2019   Preventative health care 11/22/2016   Medicare annual wellness visit, initial 11/22/2016   Osteoarthritis of both hands 09/02/2016   Essential hypertension 09/02/2016   Hyperlipidemia 09/02/2016   Liver mass 01/08/2016   Liver fibrosis 01/08/2016   NEUTROPENIA UNSPECIFIED 09/01/2009   COCAINE ABUSE 09/01/2009   Chronic hepatitis C without hepatic coma (HCC) 08/15/2009   TRIGGER FINGER 08/15/2009   URINALYSIS, ABNORMAL 08/15/2009   COLONIC POLYPS, HX  OF 08/15/2009   Past Medical History:  Diagnosis Date   Essential hypertension    Gout 01/02/2020   Gout crystals on joint fluid April 2021   Hepatitis C 2003   Treated   Hyperlipidemia     Family History  Problem Relation Age of Onset   CAD Brother 80   Kidney cancer Mother    Bone cancer Father    Colon cancer Neg Hx    Pancreatic cancer Neg Hx    Rectal cancer Neg Hx    Stomach cancer Neg Hx      Past Surgical History:  Procedure Laterality Date   CARPAL TUNNEL RELEASE     bilat 1999   HEMORRHOID SURGERY     1999/4 polyps removed rectum   TRIGGER FINGER RELEASE     right hand 1999   Social History   Occupational History   Occupation: Education administrator  Tobacco Use   Smoking status: Never Smoker   Smokeless tobacco: Never Used  Building services engineer Use: Never used  Substance and Sexual Activity   Alcohol use: Not Currently    Alcohol/week: 2.0 - 3.0 standard drinks    Types: 2 - 3 Shots of liquor per week   Drug use: No    Comment: smoked marijuana "years ago"   Sexual activity: Yes

## 2020-10-13 ENCOUNTER — Ambulatory Visit (INDEPENDENT_AMBULATORY_CARE_PROVIDER_SITE_OTHER): Payer: Medicare HMO | Admitting: Family

## 2020-10-13 DIAGNOSIS — R059 Cough, unspecified: Secondary | ICD-10-CM | POA: Diagnosis not present

## 2020-10-13 MED ORDER — BENZONATATE 100 MG PO CAPS: 100.0000 mg | ORAL_CAPSULE | Freq: Three times a day (TID) | ORAL | 0 refills | Status: DC | PRN

## 2020-10-13 NOTE — Progress Notes (Signed)
Christopher Mcfarland is a 75 y.o. male with the following history as recorded in [employer]:  Patient Active Problem List   Diagnosis Date Noted  . Trigger middle finger of left hand 09/20/2020  . Gout 01/02/2020  . Right lumbar radiculitis 07/23/2019  . Neck pain, musculoskeletal 06/27/2019  . Erectile dysfunction 03/11/2019  . Vitamin D deficiency 03/11/2019  . Hyperglycemia 03/07/2019  . Preventative health care 11/22/2016  . Medicare annual wellness visit, initial 11/22/2016  . Osteoarthritis of both hands 09/02/2016  . Essential hypertension 09/02/2016  . Hyperlipidemia 09/02/2016  . Liver mass 01/08/2016  . Liver fibrosis 01/08/2016  . NEUTROPENIA UNSPECIFIED 09/01/2009  . COCAINE ABUSE 09/01/2009  . Chronic hepatitis C without hepatic coma (HCC) 08/15/2009  . TRIGGER FINGER 08/15/2009  . URINALYSIS, ABNORMAL 08/15/2009  . COLONIC POLYPS, HX OF 08/15/2009    Current Outpatient Medications  Medication Sig Dispense Refill  . benzonatate (TESSALON) 100 MG capsule Take 1 capsule (100 mg total) by mouth 3 (three) times daily as needed. 20 capsule 0  . allopurinol (ZYLOPRIM) 300 MG tablet Take 1 tablet (300 mg total) by mouth daily. 90 tablet 3  . amLODipine (NORVASC) 5 MG tablet TAKE 1 TABLET(5 MG) BY MOUTH DAILY 90 tablet 3  . Colchicine 0.6 MG CAPS Take 1 capsule by mouth daily as needed (gout). 90 capsule 1  . diclofenac Sodium (VOLTAREN) 1 % GEL Apply 4 g topically 4 (four) times daily. 100 g 0  . ipratropium (ATROVENT) 0.06 % nasal spray Place 2 sprays into both nostrils 3 (three) times daily.    . mupirocin ointment (BACTROBAN) 2 % APPLY WITHIN BOTH NASAL PASSAGES TWICE DAILY FOR 2 WEEKS    . rosuvastatin (CRESTOR) 10 MG tablet TAKE 1 TABLET BY MOUTH EVERY DAy 90 tablet 3  . sildenafil (VIAGRA) 100 MG tablet Take 0.5-1 tablets (50-100 mg total) by mouth daily as needed for erectile dysfunction. 5 tablet 11  . ULTRAM 50 MG tablet Take 50 mg by mouth every 6 (six) hours.     No  current facility-administered medications for this visit.    Allergies: Patient has no known allergies.  Past Medical History:  Diagnosis Date  . Essential hypertension   . Gout 01/02/2020   Gout crystals on joint fluid April 2021  . Hepatitis C 2003   Treated  . Hyperlipidemia     Past Surgical History:  Procedure Laterality Date  . CARPAL TUNNEL RELEASE     bilat 1999  . HEMORRHOID SURGERY     1999/4 polyps removed rectum  . TRIGGER FINGER RELEASE     right hand 1999    Family History  Problem Relation Age of Onset  . CAD Brother 64  . Kidney cancer Mother   . Bone cancer Father   . Colon cancer Neg Hx   . Pancreatic cancer Neg Hx   . Rectal cancer Neg Hx   . Stomach cancer Neg Hx     Social History   Tobacco Use  . Smoking status: Never Smoker  . Smokeless tobacco: Never Used  Substance Use Topics  . Alcohol use: Not Currently    Alcohol/week: 2.0 - 3.0 standard drinks    Types: 2 - 3 Shots of liquor per week    Subjective:   I connected with [name] on 10/13/20 at 11:20 AM EST by a telephone call  and verified that I am speaking with the correct person using two identifiers.   I discussed the limitations of  evaluation and management by telemedicine and the availability of in person appointments. The patient expressed understanding and agreed to proceed. Provider in office/ patient is at home; provider and patient are only 2 people on telephone call.   Complaining of "dry cough" x  1 1/2 weeks; requesting something stronger than OTC medication; no fever or sore throat; no difficulty breathing; negative test for COVID on 1/14- cough is lingering;     Objective:  There were no vitals filed for this visit.  Lungs: Respirations unlabored; Voice is strong Neurologic: Alert and oriented; speech intact;  Assessment:  1. Cough     Plan:  Increase fluids, rest; Rx for Tessalon Perles- use as directed; if cough persists, needs in office visit/ CXR; he  understands to call back if symptoms still present in 1 week;  Time spent 10 minutes  No follow-ups on file.  No orders of the defined types were placed in this encounter.   Requested Prescriptions   Signed Prescriptions Disp Refills  . benzonatate (TESSALON) 100 MG capsule 20 capsule 0    Sig: Take 1 capsule (100 mg total) by mouth 3 (three) times daily as needed.

## 2020-10-15 DIAGNOSIS — H524 Presbyopia: Secondary | ICD-10-CM | POA: Diagnosis not present

## 2020-10-15 DIAGNOSIS — Z0100 Encounter for examination of eyes and vision without abnormal findings: Secondary | ICD-10-CM | POA: Diagnosis not present

## 2020-10-16 ENCOUNTER — Other Ambulatory Visit: Payer: Self-pay | Admitting: Family Medicine

## 2020-10-16 NOTE — Telephone Encounter (Signed)
Left VM for pt notifying him that his rx was refilled.

## 2020-10-16 NOTE — Telephone Encounter (Signed)
Patient called requesting a refill on this medication a well.

## 2020-10-16 NOTE — Telephone Encounter (Signed)
Pt hasn't been seen in clinic since 04/14/20. Please advise.

## 2020-10-20 ENCOUNTER — Other Ambulatory Visit: Payer: Self-pay | Admitting: Orthopedic Surgery

## 2020-10-20 DIAGNOSIS — M65332 Trigger finger, left middle finger: Secondary | ICD-10-CM | POA: Diagnosis not present

## 2020-10-20 MED ORDER — TRAMADOL HCL 50 MG PO TABS: 50.0000 mg | ORAL_TABLET | Freq: Four times a day (QID) | ORAL | 0 refills | Status: DC | PRN

## 2020-10-29 ENCOUNTER — Encounter: Payer: Self-pay | Admitting: Adult Health

## 2020-10-29 ENCOUNTER — Other Ambulatory Visit: Payer: Self-pay

## 2020-10-29 ENCOUNTER — Ambulatory Visit (INDEPENDENT_AMBULATORY_CARE_PROVIDER_SITE_OTHER): Payer: Medicare HMO | Admitting: Orthopedic Surgery

## 2020-10-29 ENCOUNTER — Telehealth (INDEPENDENT_AMBULATORY_CARE_PROVIDER_SITE_OTHER): Payer: Medicare HMO | Admitting: Adult Health

## 2020-10-29 ENCOUNTER — Encounter: Payer: Self-pay | Admitting: Orthopedic Surgery

## 2020-10-29 VITALS — Wt 207.0 lb

## 2020-10-29 DIAGNOSIS — R058 Other specified cough: Secondary | ICD-10-CM

## 2020-10-29 DIAGNOSIS — M65842 Other synovitis and tenosynovitis, left hand: Secondary | ICD-10-CM

## 2020-10-29 MED ORDER — GUAIFENESIN-CODEINE 100-10 MG/5ML PO SOLN: 10.0000 mL | Freq: Four times a day (QID) | ORAL | 0 refills | Status: DC | PRN

## 2020-10-29 NOTE — Progress Notes (Signed)
Virtual Visit via Telephone Note  I connected with Christopher Mcfarland on 10/29/20 at  3:30 PM EST by telephone and verified that I am speaking with the correct person using two identifiers.   I discussed the limitations, risks, security and privacy concerns of performing an evaluation and management service by telephone and the availability of in person appointments. I also discussed with the patient that there may be a patient responsible charge related to this service. The patient expressed understanding and agreed to proceed.  Location patient: home Location provider: work or home office Participants present for the call: patient, provider Patient did not have a visit in the prior 7 days to address this/these issue(s).   History of Present Illness: 75 year old male who  has a past medical history of Essential hypertension, Gout (01/02/2020), Hepatitis C (2003), and Hyperlipidemia.  Originally evaluated on 10/13/2020 by phone visit for "dry cough" for roughly 1-1/2 weeks prior.  Denied fever, sore throat, difficulty breathing.  He had a negative Covid test on 09/26/2020.   Prescribed Tessalon Perles, reports that these have worked pretty well for him but he continues to have a very mild lingering cough.  Again he denies fevers, chills, shortness of breath or wheezing.  He is going to the gym and working out without difficulty.  Cough is more severe at night and is keeping him up.   Observations/Objective: Patient sounds cheerful and well on the phone. I do not appreciate any SOB. Speech and thought processing are grossly intact. Patient reported vitals:  Assessment and Plan:   Follow Up Instructions:   I did not refer this patient for an OV in the next 24 hours for this/these issue(s).  I discussed the assessment and treatment plan with the patient. The patient was provided an opportunity to ask questions and all were answered. The patient agreed with the plan and demonstrated an  understanding of the instructions.   The patient was advised to call back or seek an in-person evaluation if the symptoms worsen or if the condition fails to improve as anticipated.  I provided 11 minutes of non-face-to-face time during this encounter.   Shirline Frees, NP

## 2020-10-29 NOTE — Progress Notes (Signed)
Post-Op Visit Note   Patient: Christopher Mcfarland           Date of Birth: 03/28/1946           MRN: [id number] Visit Date: 10/29/2020 PCP: Corwin Levins, MD   Assessment & Plan:  Chief Complaint:  Chief Complaint  Patient presents with  . Other     Post op   Visit Diagnoses:  1. Stenosing tenosynovitis of finger of left hand     Plan: Patient is a 75 year old male who presents s/p left long trigger finger release on 10/20/2020.  Reports he is doing well today.  Pain is well controlled.  He is taking occasional prednisone for pain control but recommended he transition from this to Tylenol and Advil for pain control.  Incision is healing well but there is still some side to side healing that must occur before suture removal.  Plan to leave sutures intact and follow-up next Wednesday or Thursday for suture removal.  He has to go out of town to [place] this weekend so likely Thursday.  Continue range of motion of finger.  No lifting heavy with the extremity.  No signs of infection or dehiscence at the incision site.  Does have some stiffness, particularly with extension but this should improve as he gets out from procedure.  Follow-up 2 weeks after suture removal.  Follow-Up Instructions: No follow-ups on file.   Orders:  No orders of the defined types were placed in this encounter.  No orders of the defined types were placed in this encounter.   Imaging: No results found.  PMFS History: Patient Active Problem List   Diagnosis Date Noted  . Trigger middle finger of left hand 09/20/2020  . Gout 01/02/2020  . Right lumbar radiculitis 07/23/2019  . Neck pain, musculoskeletal 06/27/2019  . Erectile dysfunction 03/11/2019  . Vitamin D deficiency 03/11/2019  . Hyperglycemia 03/07/2019  . Preventative health care 11/22/2016  . Medicare annual wellness visit, initial 11/22/2016  . Osteoarthritis of both hands 09/02/2016  . Essential hypertension 09/02/2016  . Hyperlipidemia  09/02/2016  . Liver mass 01/08/2016  . Liver fibrosis 01/08/2016  . NEUTROPENIA UNSPECIFIED 09/01/2009  . COCAINE ABUSE 09/01/2009  . Chronic hepatitis C without hepatic coma (HCC) 08/15/2009  . TRIGGER FINGER 08/15/2009  . URINALYSIS, ABNORMAL 08/15/2009  . COLONIC POLYPS, HX OF 08/15/2009   Past Medical History:  Diagnosis Date  . Essential hypertension   . Gout 01/02/2020   Gout crystals on joint fluid April 2021  . Hepatitis C 2003   Treated  . Hyperlipidemia     Family History  Problem Relation Age of Onset  . CAD Brother 71  . Kidney cancer Mother   . Bone cancer Father   . Colon cancer Neg Hx   . Pancreatic cancer Neg Hx   . Rectal cancer Neg Hx   . Stomach cancer Neg Hx     Past Surgical History:  Procedure Laterality Date  . CARPAL TUNNEL RELEASE     bilat 1999  . HEMORRHOID SURGERY     1999/4 polyps removed rectum  . TRIGGER FINGER RELEASE     right hand 1999   Social History   Occupational History  . Occupation: Painter  Tobacco Use  . Smoking status: Never Smoker  . Smokeless tobacco: Never Used  Vaping Use  . Vaping Use: Never used  Substance and Sexual Activity  . Alcohol use: Not Currently    Alcohol/week: 2.0 - 3.0 standard drinks  Types: 2 - 3 Shots of liquor per week  . Drug use: No    Comment: smoked marijuana "years ago"  . Sexual activity: Yes

## 2020-11-06 ENCOUNTER — Other Ambulatory Visit: Payer: Self-pay

## 2020-11-06 ENCOUNTER — Ambulatory Visit: Payer: Medicare HMO

## 2020-11-17 DIAGNOSIS — J300 Vasomotor rhinitis: Secondary | ICD-10-CM | POA: Diagnosis not present

## 2020-11-17 DIAGNOSIS — R040 Epistaxis: Secondary | ICD-10-CM | POA: Diagnosis not present

## 2020-12-04 ENCOUNTER — Ambulatory Visit (INDEPENDENT_AMBULATORY_CARE_PROVIDER_SITE_OTHER): Payer: Medicare HMO | Admitting: Orthopedic Surgery

## 2020-12-04 ENCOUNTER — Encounter: Payer: Self-pay | Admitting: Orthopedic Surgery

## 2020-12-04 DIAGNOSIS — M65842 Other synovitis and tenosynovitis, left hand: Secondary | ICD-10-CM

## 2020-12-04 NOTE — Progress Notes (Signed)
Post-Op Visit Note   Patient: Christopher Mcfarland           Date of Birth: 1946/03/28           MRN: [id number] Visit Date: 12/04/2020 PCP: Corwin Levins, MD   Assessment & Plan:  Chief Complaint:  Chief Complaint  Patient presents with  . Left Little Finger - Follow-up   Visit Diagnoses:  1. Stenosing tenosynovitis of finger of left hand     Plan: Patient is a 75 year old male who presents s/p left long trigger finger release on 10/20/2020.  He feels that he is doing excellent.  No complaints.  Incision has healed well.  He has been protecting the hand and not really lifting anything heavy with it.  Plan to release patient for full activity now that the incision is healed.  He does not have to take any medications for pain.  He is able to make a full fist and denies any triggering.  He does have some stiffness with full extension of the long finger but he says that this is how his other trigger finger was 2 decades ago and he was able to get it straight on his own.  Plan to follow-up in 6 weeks for clinical recheck.  If he still has difficulty with achieving full extension, plan to refer patient to occupational therapy.  Patient agreed with plan.  Follow-Up Instructions: No follow-ups on file.   Orders:  No orders of the defined types were placed in this encounter.  No orders of the defined types were placed in this encounter.   Imaging: No results found.  PMFS History: Patient Active Problem List   Diagnosis Date Noted  . Trigger middle finger of left hand 09/20/2020  . Gout 01/02/2020  . Right lumbar radiculitis 07/23/2019  . Neck pain, musculoskeletal 06/27/2019  . Erectile dysfunction 03/11/2019  . Vitamin D deficiency 03/11/2019  . Hyperglycemia 03/07/2019  . Preventative health care 11/22/2016  . Medicare annual wellness visit, initial 11/22/2016  . Osteoarthritis of both hands 09/02/2016  . Essential hypertension 09/02/2016  . Hyperlipidemia 09/02/2016  . Liver  mass 01/08/2016  . Liver fibrosis 01/08/2016  . NEUTROPENIA UNSPECIFIED 09/01/2009  . COCAINE ABUSE 09/01/2009  . Chronic hepatitis C without hepatic coma (HCC) 08/15/2009  . TRIGGER FINGER 08/15/2009  . URINALYSIS, ABNORMAL 08/15/2009  . COLONIC POLYPS, HX OF 08/15/2009   Past Medical History:  Diagnosis Date  . Essential hypertension   . Gout 01/02/2020   Gout crystals on joint fluid April 2021  . Hepatitis C 2003   Treated  . Hyperlipidemia     Family History  Problem Relation Age of Onset  . CAD Brother 32  . Kidney cancer Mother   . Bone cancer Father   . Colon cancer Neg Hx   . Pancreatic cancer Neg Hx   . Rectal cancer Neg Hx   . Stomach cancer Neg Hx     Past Surgical History:  Procedure Laterality Date  . CARPAL TUNNEL RELEASE     bilat 1999  . HEMORRHOID SURGERY     1999/4 polyps removed rectum  . TRIGGER FINGER RELEASE     right hand 1999   Social History   Occupational History  . Occupation: Painter  Tobacco Use  . Smoking status: Never Smoker  . Smokeless tobacco: Never Used  Vaping Use  . Vaping Use: Never used  Substance and Sexual Activity  . Alcohol use: Not Currently    Alcohol/week: 2.0 -  3.0 standard drinks    Types: 2 - 3 Shots of liquor per week  . Drug use: No    Comment: smoked marijuana "years ago"  . Sexual activity: Yes

## 2021-01-15 ENCOUNTER — Ambulatory Visit: Payer: Medicare HMO | Admitting: Orthopedic Surgery

## 2021-01-29 DIAGNOSIS — Z23 Encounter for immunization: Secondary | ICD-10-CM | POA: Diagnosis not present

## 2021-03-27 ENCOUNTER — Ambulatory Visit: Payer: Medicare HMO

## 2021-04-14 ENCOUNTER — Other Ambulatory Visit: Payer: Self-pay | Admitting: Internal Medicine

## 2021-04-14 DIAGNOSIS — I10 Essential (primary) hypertension: Secondary | ICD-10-CM

## 2021-04-14 NOTE — Telephone Encounter (Signed)
Please refill as per office routine med refill policy (all routine meds refilled for 3 mo or monthly per pt preference up to one year from last visit, then month to month grace period for 3 mo, then further med refills will have to be denied)  

## 2021-04-17 ENCOUNTER — Other Ambulatory Visit: Payer: Self-pay

## 2021-04-17 ENCOUNTER — Ambulatory Visit (INDEPENDENT_AMBULATORY_CARE_PROVIDER_SITE_OTHER): Payer: Medicare HMO

## 2021-04-17 VITALS — BP 122/70 | HR 102 | Temp 98.1°F | Ht 68.0 in | Wt 207.2 lb

## 2021-04-17 DIAGNOSIS — Z0000 Encounter for general adult medical examination without abnormal findings: Secondary | ICD-10-CM | POA: Diagnosis not present

## 2021-04-17 NOTE — Progress Notes (Signed)
Subjective:   Christopher Mcfarland is a 75 y.o. male who presents for Medicare Annual/Subsequent preventive examination.  Review of Systems     Cardiac Risk Factors include: advanced age (>2055men, 28>65 women);dyslipidemia;hypertension;male gender;obesity (BMI >30kg/m2)     Objective:    Today's Vitals   04/17/21 1513  BP: 122/70  Pulse: (!) 102  Temp: 98.1 F (36.7 C)  SpO2: 95%  Weight: 207 lb 3.2 oz (94 kg)  Height: 5\' 8"  (1.727 m)  PainSc: 0-No pain   Body mass index is 31.5 kg/m.  Advanced Directives 04/17/2021 03/21/2020 12/22/2019 07/28/2019 03/07/2019 07/30/2018 08/12/2017  Does Patient Have a Medical Advance Directive? No No No No No No No  Would patient like information on creating a medical advance directive? No - Patient declined No - Patient declined - No - Patient declined Yes (MAU/Ambulatory/Procedural Areas - Information given) No - Patient declined -    Current Medications (verified) Outpatient Encounter Medications as of 04/17/2021  Medication Sig   allopurinol (ZYLOPRIM) 300 MG tablet Take 1 tablet (300 mg total) by mouth daily.   amLODipine (NORVASC) 5 MG tablet TAKE 1 TABLET(5 MG) BY MOUTH DAILY   Colchicine 0.6 MG CAPS TAKE 1 CAPSULE BY MOUTH DAILY AS NEEDED FOR GOUT   diclofenac Sodium (VOLTAREN) 1 % GEL Apply 4 g topically 4 (four) times daily.   ipratropium (ATROVENT) 0.06 % nasal spray Place 2 sprays into both nostrils 3 (three) times daily.   mupirocin ointment (BACTROBAN) 2 % APPLY WITHIN BOTH NASAL PASSAGES TWICE DAILY FOR 2 WEEKS   rosuvastatin (CRESTOR) 10 MG tablet TAKE 1 TABLET BY MOUTH EVERY DAY   sildenafil (VIAGRA) 100 MG tablet Take 0.5-1 tablets (50-100 mg total) by mouth daily as needed for erectile dysfunction.   benzonatate (TESSALON) 100 MG capsule Take 1 capsule (100 mg total) by mouth 3 (three) times daily as needed. (Patient not taking: Reported on 04/17/2021)   guaiFENesin-codeine 100-10 MG/5ML syrup Take 10 mLs by mouth every 6 (six) hours  as needed for cough. (Patient not taking: Reported on 04/17/2021)   traMADol (ULTRAM) 50 MG tablet Take 1 tablet (50 mg total) by mouth every 6 (six) hours as needed. (Patient not taking: Reported on 04/17/2021)   ULTRAM 50 MG tablet Take 50 mg by mouth every 6 (six) hours. (Patient not taking: Reported on 04/17/2021)   No facility-administered encounter medications on file as of 04/17/2021.    Allergies (verified) Patient has no known allergies.   History: Past Medical History:  Diagnosis Date   Essential hypertension    Gout 01/02/2020   Gout crystals on joint fluid April 2021   Hepatitis C 2003   Treated   Hyperlipidemia    Past Surgical History:  Procedure Laterality Date   CARPAL TUNNEL RELEASE     bilat 1999   HEMORRHOID SURGERY     1999/4 polyps removed rectum   TRIGGER FINGER RELEASE     right hand 1999   Family History  Problem Relation Age of Onset   CAD Brother [id number]   Kidney cancer Mother    Bone cancer Father    Colon cancer Neg Hx    Pancreatic cancer Neg Hx    Rectal cancer Neg Hx    Stomach cancer Neg Hx    Social History   Socioeconomic History   Marital status: Married    Spouse name: Not on file   Number of children: 4   Years of education: 12   Highest education level:  Not on file  Occupational History   Occupation: Education administrator  Tobacco Use   Smoking status: Never   Smokeless tobacco: Never  Vaping Use   Vaping Use: Never used  Substance and Sexual Activity   Alcohol use: Not Currently    Alcohol/week: 2.0 - 3.0 standard drinks    Types: 2 - 3 Shots of liquor per week   Drug use: No    Comment: smoked marijuana "years ago"   Sexual activity: Yes  Other Topics Concern   Not on file  Social History Narrative   Fun: Play pool, yard work, gym, and Economist Determinants of Corporate investment banker Strain: Low Risk    Difficulty of Paying Living Expenses: Not hard at all  Food Insecurity: No Food Insecurity   Worried About Brewing technologist in the Last Year: Never true   Barista in the Last Year: Never true  Transportation Needs: No Transportation Needs   Lack of Transportation (Medical): No   Lack of Transportation (Non-Medical): No  Physical Activity: Sufficiently Active   Days of Exercise per Week: 5 days   Minutes of Exercise per Session: 30 min  Stress: No Stress Concern Present   Feeling of Stress : Not at all  Social Connections: Socially Integrated   Frequency of Communication with Friends and Family: More than three times a week   Frequency of Social Gatherings with Friends and Family: More than three times a week   Attends Religious Services: More than 4 times per year   Active Member of [employer] or Organizations: No   Attends Engineer, structural: More than 4 times per year   Marital Status: Living with partner    Tobacco Counseling Counseling given: Not Answered   Clinical Intake:  Pre-visit preparation completed: Yes  Pain : No/denies pain Pain Score: 0-No pain     BMI - recorded: 31.5 Nutritional Status: BMI > 30  Obese Nutritional Risks: None Diabetes: No  How often do you need to have someone help you when you read instructions, pamphlets, or other written materials from your doctor or pharmacy?: 1 - Never What is the last grade level you completed in school?: High School Graduate; Works in Skilled Nursing Facility  Diabetic? no  Interpreter Needed?: No  Information entered by :: Susie Cassette, LPN   Activities of Daily Living In your present state of health, do you have any difficulty performing the following activities: 04/17/2021  Hearing? N  Comment wears hearing aids  Vision? N  Difficulty concentrating or making decisions? N  Walking or climbing stairs? N  Dressing or bathing? N  Doing errands, shopping? N  Preparing Food and eating ? N  Using the Toilet? N  In the past six months, have you accidently leaked urine? N  Do you have problems with loss of  bowel control? N  Managing your Medications? N  Managing your Finances? N  Housekeeping or managing your Housekeeping? N  Some recent data might be hidden    Patient Care Team: Corwin Levins, MD as PCP - General (Internal Medicine) Myrtie Neither, Andreas Blower, MD as Consulting Physician (Gastroenterology) Comer, Belia Heman, MD as Consulting Physician (Infectious Diseases) Christia Reading, MD as Consulting Physician (Otolaryngology)  Indicate any recent Medical Services you may have received from other than [facility] providers in the past year (date may be approximate).     Assessment:   This is a routine wellness examination for [place].  Hearing/Vision screen No results found.  Dietary issues and exercise activities discussed: Current Exercise Habits: Home exercise routine, Type of exercise: walking;strength training/weights;treadmill;stretching, Time (Minutes): 60, Frequency (Times/Week): 3, Weekly Exercise (Minutes/Week): 180, Intensity: Moderate, Exercise limited by: None identified   Goals Addressed             This Visit's Progress    Patient Stated       I would like to get down to 180 pounds by being physically active and watching my food intake.      Depression Screen PHQ 2/9 Scores 04/17/2021 03/07/2020 03/07/2019 03/07/2019 12/12/2017 11/22/2016 08/27/2015  PHQ - 2 Score 0 0 0 0 0 0 0    Fall Risk Fall Risk  04/17/2021 03/21/2020 03/07/2020 03/07/2019 03/07/2019  Falls in the past year? 0 0 0 0 0  Number falls in past yr: 0 0 - 0 -  Injury with Fall? 0 0 - - -  Risk for fall due to : No Fall Risks No Fall Risks - - -  Follow up Falls evaluation completed Falls evaluation completed;Education provided - - -    FALL RISK PREVENTION PERTAINING TO THE HOME:  Any stairs in or around the home? No  If so, are there any without handrails? No  Home free of loose throw rugs in walkways, pet beds, electrical cords, etc? Yes  Adequate lighting in your home to reduce risk of falls? Yes   ASSISTIVE  DEVICES UTILIZED TO PREVENT FALLS:  Life alert? No  Use of a cane, walker or w/c? No  Grab bars in the bathroom? No  Shower chair or bench in shower? No  Elevated toilet seat or a handicapped toilet? No   TIMED UP AND GO:  Was the test performed? Yes .  Length of time to ambulate 10 feet: 6 sec.   Gait steady and fast without use of assistive device  Cognitive Function: Normal cognitive status assessed by direct observation by this Nurse Health Advisor. No abnormalities found.       6CIT Screen 03/21/2020  What Year? 0 points  What month? 0 points  What time? 0 points  Count back from 20 0 points  Months in reverse 0 points  Repeat phrase 0 points  Total Score 0    Immunizations Immunization History  Administered Date(s) Administered   Fluad Quad(high Dose 65+) 06/27/2019   Influenza Whole 08/15/2009   Influenza, High Dose Seasonal PF 07/11/2018   Influenza,inj,Quad PF,6+ Mos 08/27/2015   Moderna Sars-Covid-2 Vaccination 01/29/2021   PFIZER(Purple Top)SARS-COV-2 Vaccination 11/05/2019, 11/26/2019   Pneumococcal Conjugate-13 12/12/2017   Pneumococcal Polysaccharide-23 03/07/2019   Td 10/21/2009   Tdap 03/07/2020   Unspecified SARS-COV-2 Vaccination 10/15/2019, 11/08/2019   Zoster Recombinat (Shingrix) 03/06/2018   Zoster, Live 01/18/2016    TDAP status: Up to date  Flu Vaccine status: Up to date  Pneumococcal vaccine status: Due, Education has been provided regarding the importance of this vaccine. Advised may receive this vaccine at local pharmacy or Health Dept. Aware to provide a copy of the vaccination record if obtained from local pharmacy or Health Dept. Verbalized acceptance and understanding.  Covid-19 vaccine status: Completed vaccines  Qualifies for Shingles Vaccine? Yes   Zostavax completed Yes   Shingrix Completed?: Yes  Screening Tests Health Maintenance  Topic Date Due   Zoster Vaccines- Shingrix (2 of 2) 05/01/2018   INFLUENZA VACCINE   04/13/2021   COLONOSCOPY (Pts 45-16yrs Insurance coverage will need to be confirmed)  06/12/2024   TETANUS/TDAP  03/07/2030   COVID-19 Vaccine  Completed   Hepatitis C Screening  Completed   PNA vac Low Risk Adult  Completed   HPV VACCINES  Aged Out    Health Maintenance  Health Maintenance Due  Topic Date Due   Zoster Vaccines- Shingrix (2 of 2) 05/01/2018   INFLUENZA VACCINE  04/13/2021    Colorectal cancer screening: Type of screening: Colonoscopy. Completed 06/12/2014. Repeat every 10 years  Lung Cancer Screening: (Low Dose CT Chest recommended if Age 23-80 years, 30 pack-year currently smoking OR have quit w/in 15years.) does not qualify.   Lung Cancer Screening Referral: no  Additional Screening:  Hepatitis C Screening: does qualify; Completed: yes  Vision Screening: Recommended annual ophthalmology exams for early detection of glaucoma and other disorders of the eye. Is the patient up to date with their annual eye exam?  Yes  Who is the provider or what is the name of the office in which the patient attends annual eye exams? [facility] @ [place] If pt is not established with a provider, would they like to be referred to a provider to establish care? No .   Dental Screening: Recommended annual dental exams for proper oral hygiene  Community Resource Referral / Chronic Care Management: CRR required this visit?  No   CCM required this visit?  No      Plan:     I have personally reviewed and noted the following in the patient's chart:   Medical and social history Use of alcohol, tobacco or illicit drugs  Current medications and supplements including opioid prescriptions. Patient is not currently taking opioid prescriptions. Functional ability and status Nutritional status Physical activity Advanced directives List of other physicians Hospitalizations, surgeries, and ER visits in previous 12 months Vitals Screenings to include cognitive, depression, and  falls Referrals and appointments  In addition, I have reviewed and discussed with patient certain preventive protocols, quality metrics, and best practice recommendations. A written personalized care plan for preventive services as well as general preventive health recommendations were provided to patient.     Mickeal Needy, LPN   11/17/6281   Nurse Notes: n/a

## 2021-04-17 NOTE — Patient Instructions (Signed)
Mr. Christopher Mcfarland , Thank you for taking time to come for your Medicare Wellness Visit. I appreciate your ongoing commitment to your health goals. Please review the following plan we discussed and let me know if I can assist you in the future.   Screening recommendations/referrals: Colonoscopy: last done 06/12/2014; due every 10 years Recommended yearly ophthalmology/optometry visit for glaucoma screening and checkup Recommended yearly dental visit for hygiene and checkup  Vaccinations: Influenza vaccine: due Fall Season 2022 Pneumococcal vaccine: 12/12/2017, 03/07/2019 Tdap vaccine: 03/07/2020; due every 10 years Shingles vaccine: 03/06/2018   Covid-19: 11/05/2019, 11/26/2019, 01/29/2021  Advanced directives: Advance directive discussed with you today. I have provided a copy for you to complete at home and have notarized. Once this is complete please bring a copy in to our office so we can scan it into your chart.  Conditions/risks identified: Yes; Client understands the importance of follow-up with providers by attending scheduled visits and discussed goals to eat healthier, increase physical activity, exercise the brain, socialize more, get enough sleep and make time for laughter.  Next appointment: Please schedule your next Medicare Wellness Visit with your Nurse Health Advisor in 1 year by calling [phone].  Preventive Care 85 Years and Older, Male Preventive care refers to lifestyle choices and visits with your health care provider that can promote health and wellness. What does preventive care include? A yearly physical exam. This is also called an annual well check. Dental exams once or twice a year. Routine eye exams. Ask your health care provider how often you should have your eyes checked. Personal lifestyle choices, including: Daily care of your teeth and gums. Regular physical activity. Eating a healthy diet. Avoiding tobacco and drug use. Limiting alcohol use. Practicing safe  sex. Taking low doses of aspirin every day. Taking vitamin and mineral supplements as recommended by your health care provider. What happens during an annual well check? The services and screenings done by your health care provider during your annual well check will depend on your age, overall health, lifestyle risk factors, and family history of disease. Counseling  Your health care provider may ask you questions about your: Alcohol use. Tobacco use. Drug use. Emotional well-being. Home and relationship well-being. Sexual activity. Eating habits. History of falls. Memory and ability to understand (cognition). Work and work Astronomer. Screening  You may have the following tests or measurements: Height, weight, and BMI. Blood pressure. Lipid and cholesterol levels. These may be checked every 5 years, or more frequently if you are over 38 years old. Skin check. Lung cancer screening. You may have this screening every year starting at age 60 if you have a 30-pack-year history of smoking and currently smoke or have quit within the past 15 years. Fecal occult blood test (FOBT) of the stool. You may have this test every year starting at age 66. Flexible sigmoidoscopy or colonoscopy. You may have a sigmoidoscopy every 5 years or a colonoscopy every 10 years starting at age 59. Prostate cancer screening. Recommendations will vary depending on your family history and other risks. Hepatitis C blood test. Hepatitis B blood test. Sexually transmitted disease (STD) testing. Diabetes screening. This is done by checking your blood sugar (glucose) after you have not eaten for a while (fasting). You may have this done every 1-3 years. Abdominal aortic aneurysm (AAA) screening. You may need this if you are a current or former smoker. Osteoporosis. You may be screened starting at age 65 if you are at high risk. Talk with your health  care provider about your test results, treatment options, and if  necessary, the need for more tests. Vaccines  Your health care provider may recommend certain vaccines, such as: Influenza vaccine. This is recommended every year. Tetanus, diphtheria, and acellular pertussis (Tdap, Td) vaccine. You may need a Td booster every 10 years. Zoster vaccine. You may need this after age 61. Pneumococcal 13-valent conjugate (PCV13) vaccine. One dose is recommended after age 1. Pneumococcal polysaccharide (PPSV23) vaccine. One dose is recommended after age 60. Talk to your health care provider about which screenings and vaccines you need and how often you need them. This information is not intended to replace advice given to you by your health care provider. Make sure you discuss any questions you have with your health care provider. Document Released: 09/26/2015 Document Revised: 05/19/2016 Document Reviewed: 07/01/2015 Elsevier Interactive Patient Education  2017 [employer].  Fall Prevention in the Home Falls can cause injuries. They can happen to people of all ages. There are many things you can do to make your home safe and to help prevent falls. What can I do on the outside of my home? Regularly fix the edges of walkways and driveways and fix any cracks. Remove anything that might make you trip as you walk through a door, such as a raised step or threshold. Trim any bushes or trees on the path to your home. Use bright outdoor lighting. Clear any walking paths of anything that might make someone trip, such as rocks or tools. Regularly check to see if handrails are loose or broken. Make sure that both sides of any steps have handrails. Any raised decks and porches should have guardrails on the edges. Have any leaves, snow, or ice cleared regularly. Use sand or salt on walking paths during winter. Clean up any spills in your garage right away. This includes oil or grease spills. What can I do in the bathroom? Use night lights. Install grab bars by the toilet  and in the tub and shower. Do not use towel bars as grab bars. Use non-skid mats or decals in the tub or shower. If you need to sit down in the shower, use a plastic, non-slip stool. Keep the floor dry. Clean up any water that spills on the floor as soon as it happens. Remove soap buildup in the tub or shower regularly. Attach bath mats securely with double-sided non-slip rug tape. Do not have throw rugs and other things on the floor that can make you trip. What can I do in the bedroom? Use night lights. Make sure that you have a light by your bed that is easy to reach. Do not use any sheets or blankets that are too big for your bed. They should not hang down onto the floor. Have a firm chair that has side arms. You can use this for support while you get dressed. Do not have throw rugs and other things on the floor that can make you trip. What can I do in the kitchen? Clean up any spills right away. Avoid walking on wet floors. Keep items that you use a lot in easy-to-reach places. If you need to reach something above you, use a strong step stool that has a grab bar. Keep electrical cords out of the way. Do not use floor polish or wax that makes floors slippery. If you must use wax, use non-skid floor wax. Do not have throw rugs and other things on the floor that can make you trip. What can  I do with my stairs? Do not leave any items on the stairs. Make sure that there are handrails on both sides of the stairs and use them. Fix handrails that are broken or loose. Make sure that handrails are as long as the stairways. Check any carpeting to make sure that it is firmly attached to the stairs. Fix any carpet that is loose or worn. Avoid having throw rugs at the top or bottom of the stairs. If you do have throw rugs, attach them to the floor with carpet tape. Make sure that you have a light switch at the top of the stairs and the bottom of the stairs. If you do not have them, ask someone to add  them for you. What else can I do to help prevent falls? Wear shoes that: Do not have high heels. Have rubber bottoms. Are comfortable and fit you well. Are closed at the toe. Do not wear sandals. If you use a stepladder: Make sure that it is fully opened. Do not climb a closed stepladder. Make sure that both sides of the stepladder are locked into place. Ask someone to hold it for you, if possible. Clearly mark and make sure that you can see: Any grab bars or handrails. First and last steps. Where the edge of each step is. Use tools that help you move around (mobility aids) if they are needed. These include: Canes. Walkers. Scooters. Crutches. Turn on the lights when you go into a dark area. Replace any light bulbs as soon as they burn out. Set up your furniture so you have a clear path. Avoid moving your furniture around. If any of your floors are uneven, fix them. If there are any pets around you, be aware of where they are. Review your medicines with your doctor. Some medicines can make you feel dizzy. This can increase your chance of falling. Ask your doctor what other things that you can do to help prevent falls. This information is not intended to replace advice given to you by your health care provider. Make sure you discuss any questions you have with your health care provider. Document Released: 06/26/2009 Document Revised: 02/05/2016 Document Reviewed: 10/04/2014 Elsevier Interactive Patient Education  2017 [employer].

## 2021-06-05 ENCOUNTER — Ambulatory Visit: Payer: Medicare HMO

## 2021-06-05 ENCOUNTER — Encounter: Payer: Self-pay | Admitting: Internal Medicine

## 2021-06-05 ENCOUNTER — Other Ambulatory Visit (INDEPENDENT_AMBULATORY_CARE_PROVIDER_SITE_OTHER): Payer: Medicare HMO

## 2021-06-05 ENCOUNTER — Other Ambulatory Visit: Payer: Self-pay

## 2021-06-05 ENCOUNTER — Ambulatory Visit (INDEPENDENT_AMBULATORY_CARE_PROVIDER_SITE_OTHER): Payer: Medicare HMO | Admitting: Internal Medicine

## 2021-06-05 VITALS — BP 146/82 | HR 97 | Temp 98.8°F | Ht 68.0 in | Wt 216.0 lb

## 2021-06-05 DIAGNOSIS — E559 Vitamin D deficiency, unspecified: Secondary | ICD-10-CM | POA: Diagnosis not present

## 2021-06-05 DIAGNOSIS — R059 Cough, unspecified: Secondary | ICD-10-CM

## 2021-06-05 DIAGNOSIS — R739 Hyperglycemia, unspecified: Secondary | ICD-10-CM | POA: Diagnosis not present

## 2021-06-05 DIAGNOSIS — Z0001 Encounter for general adult medical examination with abnormal findings: Secondary | ICD-10-CM

## 2021-06-05 DIAGNOSIS — Z0000 Encounter for general adult medical examination without abnormal findings: Secondary | ICD-10-CM

## 2021-06-05 DIAGNOSIS — E782 Mixed hyperlipidemia: Secondary | ICD-10-CM

## 2021-06-05 DIAGNOSIS — I10 Essential (primary) hypertension: Secondary | ICD-10-CM

## 2021-06-05 DIAGNOSIS — E538 Deficiency of other specified B group vitamins: Secondary | ICD-10-CM | POA: Diagnosis not present

## 2021-06-05 DIAGNOSIS — Z23 Encounter for immunization: Secondary | ICD-10-CM | POA: Diagnosis not present

## 2021-06-05 LAB — CBC WITH DIFFERENTIAL/PLATELET
Basophils Absolute: 0.1 10*3/uL (ref 0.0–0.1)
Basophils Relative: 1.2 % (ref 0.0–3.0)
Eosinophils Absolute: 0.1 10*3/uL (ref 0.0–0.7)
Eosinophils Relative: 3 % (ref 0.0–5.0)
HCT: 41.3 % (ref 39.0–52.0)
Hemoglobin: 13.4 g/dL (ref 13.0–17.0)
Lymphocytes Relative: 59.1 % — ABNORMAL HIGH (ref 12.0–46.0)
Lymphs Abs: 2.6 10*3/uL (ref 0.7–4.0)
MCHC: 32.5 g/dL (ref 30.0–36.0)
MCV: 87.5 fl (ref 78.0–100.0)
Monocytes Absolute: 0.5 10*3/uL (ref 0.1–1.0)
Monocytes Relative: 11.3 % (ref 3.0–12.0)
Neutro Abs: 1.1 10*3/uL — ABNORMAL LOW (ref 1.4–7.7)
Neutrophils Relative %: 25.4 % — ABNORMAL LOW (ref 43.0–77.0)
Platelets: 182 10*3/uL (ref 150.0–400.0)
RBC: 4.72 Mil/uL (ref 4.22–5.81)
RDW: 14.9 % (ref 11.5–15.5)
WBC: 4.4 10*3/uL (ref 4.0–10.5)

## 2021-06-05 LAB — HEPATIC FUNCTION PANEL
ALT: 21 U/L (ref 0–53)
AST: 22 U/L (ref 0–37)
Albumin: 4.6 g/dL (ref 3.5–5.2)
Alkaline Phosphatase: 71 U/L (ref 39–117)
Bilirubin, Direct: 0.1 mg/dL (ref 0.0–0.3)
Total Bilirubin: 0.7 mg/dL (ref 0.2–1.2)
Total Protein: 7.9 g/dL (ref 6.0–8.3)

## 2021-06-05 LAB — URINALYSIS, ROUTINE W REFLEX MICROSCOPIC
Bilirubin Urine: NEGATIVE
Hgb urine dipstick: NEGATIVE
Ketones, ur: NEGATIVE
Leukocytes,Ua: NEGATIVE
Nitrite: NEGATIVE
Specific Gravity, Urine: 1.025 (ref 1.000–1.030)
Total Protein, Urine: NEGATIVE
Urine Glucose: NEGATIVE
Urobilinogen, UA: 0.2 (ref 0.0–1.0)
pH: 5.5 (ref 5.0–8.0)

## 2021-06-05 LAB — BASIC METABOLIC PANEL
BUN: 19 mg/dL (ref 6–23)
CO2: 28 mEq/L (ref 19–32)
Calcium: 9.7 mg/dL (ref 8.4–10.5)
Chloride: 103 mEq/L (ref 96–112)
Creatinine, Ser: 1.14 mg/dL (ref 0.40–1.50)
GFR: 63.8 mL/min (ref >60.00)
Glucose, Bld: 92 mg/dL (ref 70–99)
Potassium: 3.9 mEq/L (ref 3.5–5.1)
Sodium: 139 mEq/L (ref 135–145)

## 2021-06-05 LAB — LIPID PANEL
Cholesterol: 172 mg/dL (ref 0–200)
HDL: 37.1 mg/dL — ABNORMAL LOW (ref >39.00)
LDL Cholesterol: 110 mg/dL — ABNORMAL HIGH (ref 0–99)
NonHDL: 134.68
Total CHOL/HDL Ratio: 5
Triglycerides: 123 mg/dL (ref 0.0–149.0)
VLDL: 24.6 mg/dL (ref 0.0–40.0)

## 2021-06-05 LAB — HEMOGLOBIN A1C: Hgb A1c MFr Bld: 6.1 % (ref 4.6–6.5)

## 2021-06-05 MED ORDER — HYDROCODONE BIT-HOMATROP MBR 5-1.5 MG/5ML PO SOLN: 5.0000 mL | Freq: Four times a day (QID) | ORAL | 0 refills | Status: DC | PRN

## 2021-06-05 NOTE — Progress Notes (Signed)
Patient ID: Christopher Mcfarland, male   DOB: 04-19-1946, 75 y.o.   MRN: [id number]         Chief Complaint:: wellness exam and Back Pain  And cough, ow vit d, hld, hyperglycemia       HPI:  Christopher Mcfarland is a 75 y.o. male here for wellness exam; already up to date with preventive referrals and immunizations except due for Shingrix #2 today.                          Also Pt continues to have recurring LBP without change in severity, bowel or bladder change, fever, wt loss,  worsening LE pain/numbness/weakness, gait change or falls - had a flare last wk with marked stiffness but has now essenatily resolved, returned to baseline.  Also had chronic cough maybe slightly worsening frequency recently, but nonproductive, no fever, and no overt sinus or reflux symptoms.  Pt denies chest pain, increased sob or doe, wheezing, orthopnea, PND, increased LE swelling, palpitations, dizziness or syncope.   Pt denies polydipsia, polyuria, or new focal neuro s/s.   Pt denies fever, wt loss, night sweats, loss of appetite, or other constitutional symptoms     Wt Readings from Last 3 Encounters:  06/05/21 216 lb (98 kg)  04/17/21 207 lb 3.2 oz (94 kg)  10/29/20 207 lb (93.9 kg)   BP Readings from Last 3 Encounters:  06/05/21 (!) 146/82  04/17/21 122/70  09/19/20 128/82   Immunization History  Administered Date(s) Administered   Fluad Quad(high Dose 65+) 06/27/2019, 06/05/2021   Influenza Whole 08/15/2009   Influenza, High Dose Seasonal PF 07/11/2018   Influenza,inj,Quad PF,6+ Mos 08/27/2015   Moderna Sars-Covid-2 Vaccination 01/29/2021   PFIZER(Purple Top)SARS-COV-2 Vaccination 11/05/2019, 11/26/2019   Pneumococcal Conjugate-13 12/12/2017   Pneumococcal Polysaccharide-23 03/07/2019   Td 10/21/2009   Tdap 03/07/2020   Unspecified SARS-COV-2 Vaccination 10/15/2019, 11/08/2019   Zoster Recombinat (Shingrix) 03/06/2018, 06/05/2021   Zoster, Live 01/18/2016   There are no preventive care reminders to  display for this patient.     Past Medical History:  Diagnosis Date   Essential hypertension    Gout 01/02/2020   Gout crystals on joint fluid April 2021   Hepatitis C 2003   Treated   Hyperlipidemia    Past Surgical History:  Procedure Laterality Date   CARPAL TUNNEL RELEASE     bilat 1999   HEMORRHOID SURGERY     1999/4 polyps removed rectum   TRIGGER FINGER RELEASE     right hand 1999    reports that he has never smoked. He has never used smokeless tobacco. He reports that he does not currently use alcohol after a past usage of about 2.0 - 3.0 standard drinks per week. He reports that he does not use drugs. family history includes Bone cancer in his father; CAD (age of onset: 75) in his brother; Kidney cancer in his mother. No Known Allergies Current Outpatient Medications on File Prior to Visit  Medication Sig Dispense Refill   allopurinol (ZYLOPRIM) 300 MG tablet Take 1 tablet (300 mg total) by mouth daily. 90 tablet 3   amLODipine (NORVASC) 5 MG tablet TAKE 1 TABLET(5 MG) BY MOUTH DAILY 90 tablet 3   Colchicine 0.6 MG CAPS TAKE 1 CAPSULE BY MOUTH DAILY AS NEEDED FOR GOUT 90 capsule 1   diclofenac Sodium (VOLTAREN) 1 % GEL Apply 4 g topically 4 (four) times daily. 100 g 0   guaiFENesin-codeine 100-10  MG/5ML syrup Take 10 mLs by mouth every 6 (six) hours as needed for cough. 118 mL 0   ipratropium (ATROVENT) 0.06 % nasal spray Place 2 sprays into both nostrils 3 (three) times daily.     mupirocin ointment (BACTROBAN) 2 % APPLY WITHIN BOTH NASAL PASSAGES TWICE DAILY FOR 2 WEEKS     rosuvastatin (CRESTOR) 10 MG tablet TAKE 1 TABLET BY MOUTH EVERY DAY 90 tablet 3   sildenafil (VIAGRA) 100 MG tablet Take 0.5-1 tablets (50-100 mg total) by mouth daily as needed for erectile dysfunction. 5 tablet 11   traMADol (ULTRAM) 50 MG tablet Take 1 tablet (50 mg total) by mouth every 6 (six) hours as needed. 30 tablet 0   ULTRAM 50 MG tablet Take 50 mg by mouth every 6 (six) hours.      benzonatate (TESSALON) 100 MG capsule Take 1 capsule (100 mg total) by mouth 3 (three) times daily as needed. (Patient not taking: Reported on 06/05/2021) 20 capsule 0   No current facility-administered medications on file prior to visit.        ROS:  All others reviewed and negative.  Objective        PE:  BP (!) 146/82 (BP Location: Right Arm, Patient Position: Sitting, Cuff Size: Large)   Pulse 97   Temp 98.8 F (37.1 C) (Oral)   Ht 5\' 8"  (1.727 m)   Wt 216 lb (98 kg)   SpO2 95%   BMI 32.84 kg/m                 Constitutional: Pt appears in NAD               HENT: Head: NCAT.                Right Ear: External ear normal.                 Left Ear: External ear normal.                Eyes: . Pupils are equal, round, and reactive to light. Conjunctivae and EOM are normal               Nose: without d/c or deformity               Neck: Neck supple. Gross normal ROM               Cardiovascular: Normal rate and regular rhythm.                 Pulmonary/Chest: Effort normal and breath sounds without rales or wheezing.                Abd:  Soft, NT, ND, + BS, no organomegaly               Neurological: Pt is alert. At baseline orientation, motor grossly intact               Skin: Skin is warm. No rashes, no other new lesions, LE edema - none               Psychiatric: Pt behavior is normal without agitation   Micro: none  Cardiac tracings I have personally interpreted today:  none  Pertinent Radiological findings (summarize): none   Lab Results  Component Value Date   WBC 4.9 03/07/2020   HGB 13.1 (L) 03/07/2020   HCT 40.1 03/07/2020   PLT 173 03/07/2020   GLUCOSE 90 03/07/2020  CHOL 150 03/07/2020   TRIG 86 03/07/2020   HDL 41 03/07/2020   LDLCALC 91 03/07/2020   ALT 24 03/07/2020   AST 24 03/07/2020   NA 141 03/07/2020   K 3.8 03/07/2020   CL 105 03/07/2020   CREATININE 1.09 03/07/2020   BUN 18 03/07/2020   CO2 26 03/07/2020   TSH 1.93 03/07/2020   PSA 0.5  03/07/2020   INR 1.17 08/20/2015   HGBA1C 5.9 (H) 03/07/2020   Assessment/Plan:  Christopher Mcfarland is a 75 y.o. Black or African American [2] male with  has a past medical history of Essential hypertension, Gout (01/02/2020), Hepatitis C (2003), and Hyperlipidemia.  Vitamin D deficiency Last vitamin D Lab Results  Component Value Date   VD25OH 32 03/07/2020   Low, to start oral replacement   Hyperlipidemia Lab Results  Component Value Date   LDLCALC 91 03/07/2020   Stable, pt to continue current statin crestor   Hyperglycemia Lab Results  Component Value Date   HGBA1C 5.9 (H) 03/07/2020   Stable, pt to continue current medical treatment  - diet   Essential hypertension BP Readings from Last 3 Encounters:  06/05/21 (!) 146/82  04/17/21 122/70  09/19/20 128/82   Mild uncontrolled, possibly situational, pt to continue medical treatment norvasc, delcines med change   Cough Etiology unclear for cxr,  to f/u any worsening symptoms or concerns   Encounter for well adult exam with abnormal findings Age and sex appropriate education and counseling updated with regular exercise and diet Referrals for preventative services - none needed Immunizations addressed - for shingrix #2 Smoking counseling  - none needed Evidence for depression or other mood disorder - none significant Most recent labs reviewed. I have personally reviewed and have noted: 1) the patient's medical and social history 2) The patient's current medications and supplements 3) The patient's height, weight, and BMI have been recorded in the chart  Followup: Return in about 6 months (around 12/03/2021).  Oliver Barre, MD 06/07/2021 7:30 PM [facility]

## 2021-06-05 NOTE — Patient Instructions (Addendum)
You had the flu shot today, and the Shingles shot #2 today  Please take all new medication as prescribed - the cough medicine  Please continue all other medications as before, and refills have been done if requested.  Please have the pharmacy call with any other refills you may need.  Please continue your efforts at being more active, low cholesterol diet, and weight control.  You are otherwise up to date with prevention measures today.  Please keep your appointments with your specialists as you may have planned  Please go to the XRAY Department at the [facility] basement today  Please go to the LAB at the blood drawing area for the tests to be done - at the [facility] basement today  You will be contacted by phone if any changes need to be made immediately.  Otherwise, you will receive a letter about your results with an explanation, but please check with MyChart first.  Please remember to sign up for MyChart if you have not done so, as this will be important to you in the future with finding out test results, communicating by private email, and scheduling acute appointments online when needed.  Please make an Appointment to return in 6 months, or sooner if needed

## 2021-06-07 ENCOUNTER — Encounter: Payer: Self-pay | Admitting: Internal Medicine

## 2021-06-07 DIAGNOSIS — R059 Cough, unspecified: Secondary | ICD-10-CM | POA: Insufficient documentation

## 2021-06-07 NOTE — Assessment & Plan Note (Signed)
BP Readings from Last 3 Encounters:  06/05/21 (!) 146/82  04/17/21 122/70  09/19/20 128/82   Mild uncontrolled, possibly situational, pt to continue medical treatment norvasc, delcines med change

## 2021-06-07 NOTE — Assessment & Plan Note (Signed)
Age and sex appropriate education and counseling updated with regular exercise and diet Referrals for preventative services - none needed Immunizations addressed - for shingrix #2 Smoking counseling  - none needed Evidence for depression or other mood disorder - none significant Most recent labs reviewed. I have personally reviewed and have noted: 1) the patient's medical and social history 2) The patient's current medications and supplements 3) The patient's height, weight, and BMI have been recorded in the chart

## 2021-06-07 NOTE — Assessment & Plan Note (Signed)
Lab Results  Component Value Date   LDLCALC 91 03/07/2020   Stable, pt to continue current statin crestor

## 2021-06-07 NOTE — Assessment & Plan Note (Signed)
Etiology unclear for cxr,  to f/u any worsening symptoms or concerns

## 2021-06-07 NOTE — Assessment & Plan Note (Signed)
Last vitamin D Lab Results  Component Value Date   VD25OH 32 03/07/2020   Low, to start oral replacement

## 2021-06-07 NOTE — Assessment & Plan Note (Signed)
Lab Results  Component Value Date   HGBA1C 5.9 (H) 03/07/2020   Stable, pt to continue current medical treatment  - diet

## 2021-06-08 ENCOUNTER — Encounter: Payer: Self-pay | Admitting: Internal Medicine

## 2021-06-08 ENCOUNTER — Ambulatory Visit (INDEPENDENT_AMBULATORY_CARE_PROVIDER_SITE_OTHER)
Admission: RE | Admit: 2021-06-08 | Discharge: 2021-06-08 | Disposition: A | Discharge status: Home / Self Care | Payer: Medicare HMO | Source: Ambulatory Visit | Attending: Internal Medicine | Admitting: Internal Medicine

## 2021-06-08 ENCOUNTER — Other Ambulatory Visit: Payer: Self-pay

## 2021-06-08 ENCOUNTER — Other Ambulatory Visit: Payer: Self-pay | Admitting: Internal Medicine

## 2021-06-08 DIAGNOSIS — R059 Cough, unspecified: Secondary | ICD-10-CM | POA: Diagnosis not present

## 2021-06-08 DIAGNOSIS — I10 Essential (primary) hypertension: Secondary | ICD-10-CM | POA: Diagnosis not present

## 2021-06-08 LAB — VITAMIN D 25 HYDROXY (VIT D DEFICIENCY, FRACTURES): VITD: 28.43 ng/mL — ABNORMAL LOW (ref 30.00–100.00)

## 2021-06-08 LAB — TSH: TSH: 1.85 u[IU]/mL (ref 0.35–5.50)

## 2021-06-08 LAB — PSA: PSA: 0.61 ng/mL (ref 0.10–4.00)

## 2021-06-08 LAB — VITAMIN B12: Vitamin B-12: 403 pg/mL (ref 211–911)

## 2021-06-08 MED ORDER — ROSUVASTATIN CALCIUM 20 MG PO TABS: 20.0000 mg | ORAL_TABLET | Freq: Every day | ORAL | 3 refills | Status: DC

## 2021-06-08 MED ORDER — CHOLECALCIFEROL 50 MCG (2000 UT) PO TABS: ORAL_TABLET | ORAL | 99 refills | Status: DC

## 2021-06-09 ENCOUNTER — Telehealth: Payer: Self-pay

## 2021-06-10 MED ORDER — PROMETHAZINE-CODEINE 6.25-10 MG/5ML PO SYRP: 5.0000 mL | ORAL_SOLUTION | ORAL | 0 refills | Status: AC | PRN

## 2021-06-10 NOTE — Addendum Note (Signed)
Addended by: Corwin Levins on: 06/10/2021 07:56 PM   Modules accepted: Orders

## 2021-06-10 NOTE — Telephone Encounter (Signed)
Hydromet not covered by insurance, pharmacy requesting drug change

## 2021-06-13 ENCOUNTER — Encounter: Payer: Self-pay | Admitting: Internal Medicine

## 2021-07-15 ENCOUNTER — Other Ambulatory Visit: Payer: Self-pay | Admitting: Family Medicine

## 2021-07-15 ENCOUNTER — Other Ambulatory Visit: Payer: Self-pay | Admitting: Internal Medicine

## 2021-07-15 NOTE — Telephone Encounter (Signed)
Rx refill request approved per Dr. Corey's orders. 

## 2021-07-15 NOTE — Telephone Encounter (Signed)
Please refill as per office routine med refill policy (all routine meds to be refilled for 3 mo or monthly (per pt preference) up to one year from last visit, then month to month grace period for 3 mo, then further med refills will have to be denied) ? ?

## 2021-09-08 ENCOUNTER — Other Ambulatory Visit: Payer: Self-pay | Admitting: Internal Medicine

## 2021-10-19 ENCOUNTER — Other Ambulatory Visit: Payer: Self-pay

## 2021-10-19 ENCOUNTER — Ambulatory Visit (INDEPENDENT_AMBULATORY_CARE_PROVIDER_SITE_OTHER): Payer: Medicare HMO | Admitting: Internal Medicine

## 2021-10-19 ENCOUNTER — Encounter: Payer: Self-pay | Admitting: Internal Medicine

## 2021-10-19 VITALS — BP 124/70 | HR 105 | Temp 97.9°F | Ht 68.0 in | Wt 222.0 lb

## 2021-10-19 DIAGNOSIS — R058 Other specified cough: Secondary | ICD-10-CM

## 2021-10-19 DIAGNOSIS — Z0001 Encounter for general adult medical examination with abnormal findings: Secondary | ICD-10-CM | POA: Diagnosis not present

## 2021-10-19 DIAGNOSIS — E559 Vitamin D deficiency, unspecified: Secondary | ICD-10-CM | POA: Diagnosis not present

## 2021-10-19 DIAGNOSIS — M5416 Radiculopathy, lumbar region: Secondary | ICD-10-CM

## 2021-10-19 DIAGNOSIS — E782 Mixed hyperlipidemia: Secondary | ICD-10-CM | POA: Diagnosis not present

## 2021-10-19 DIAGNOSIS — R739 Hyperglycemia, unspecified: Secondary | ICD-10-CM

## 2021-10-19 DIAGNOSIS — E538 Deficiency of other specified B group vitamins: Secondary | ICD-10-CM | POA: Diagnosis not present

## 2021-10-19 LAB — CBC WITH DIFFERENTIAL/PLATELET
Basophils Absolute: 0.1 10*3/uL (ref 0.0–0.1)
Basophils Relative: 1.5 % (ref 0.0–3.0)
Eosinophils Absolute: 0.1 10*3/uL (ref 0.0–0.7)
Eosinophils Relative: 3.4 % (ref 0.0–5.0)
HCT: 40.9 % (ref 39.0–52.0)
Hemoglobin: 13.3 g/dL (ref 13.0–17.0)
Lymphocytes Relative: 50 % — ABNORMAL HIGH (ref 12.0–46.0)
Lymphs Abs: 2.1 10*3/uL (ref 0.7–4.0)
MCHC: 32.6 g/dL (ref 30.0–36.0)
MCV: 87.6 fl (ref 78.0–100.0)
Monocytes Absolute: 0.5 10*3/uL (ref 0.1–1.0)
Monocytes Relative: 11.5 % (ref 3.0–12.0)
Neutro Abs: 1.4 10*3/uL (ref 1.4–7.7)
Neutrophils Relative %: 33.6 % — ABNORMAL LOW (ref 43.0–77.0)
Platelets: 180 10*3/uL (ref 150.0–400.0)
RBC: 4.67 Mil/uL (ref 4.22–5.81)
RDW: 14.6 % (ref 11.5–15.5)
WBC: 4.3 10*3/uL (ref 4.0–10.5)

## 2021-10-19 LAB — URINALYSIS, ROUTINE W REFLEX MICROSCOPIC
Bilirubin Urine: NEGATIVE
Hgb urine dipstick: NEGATIVE
Ketones, ur: NEGATIVE
Leukocytes,Ua: NEGATIVE
Nitrite: NEGATIVE
RBC / HPF: NONE SEEN (ref 0–?)
Specific Gravity, Urine: 1.02 (ref 1.000–1.030)
Total Protein, Urine: NEGATIVE
Urine Glucose: NEGATIVE
Urobilinogen, UA: 0.2 (ref 0.0–1.0)
pH: 5.5 (ref 5.0–8.0)

## 2021-10-19 LAB — HEPATIC FUNCTION PANEL
ALT: 23 U/L (ref 0–53)
AST: 20 U/L (ref 0–37)
Albumin: 4.3 g/dL (ref 3.5–5.2)
Alkaline Phosphatase: 57 U/L (ref 39–117)
Bilirubin, Direct: 0.1 mg/dL (ref 0.0–0.3)
Total Bilirubin: 0.5 mg/dL (ref 0.2–1.2)
Total Protein: 7.3 g/dL (ref 6.0–8.3)

## 2021-10-19 LAB — BASIC METABOLIC PANEL
BUN: 18 mg/dL (ref 6–23)
CO2: 28 mEq/L (ref 19–32)
Calcium: 9.6 mg/dL (ref 8.4–10.5)
Chloride: 106 mEq/L (ref 96–112)
Creatinine, Ser: 1.13 mg/dL (ref 0.40–1.50)
GFR: 64.31 mL/min (ref >60.00)
Glucose, Bld: 124 mg/dL — ABNORMAL HIGH (ref 70–99)
Potassium: 4.2 mEq/L (ref 3.5–5.1)
Sodium: 140 mEq/L (ref 135–145)

## 2021-10-19 LAB — TSH: TSH: 1.6 u[IU]/mL (ref 0.35–5.50)

## 2021-10-19 LAB — LIPID PANEL
Cholesterol: 136 mg/dL (ref 0–200)
HDL: 29 mg/dL — ABNORMAL LOW (ref >39.00)
NonHDL: 107.11
Total CHOL/HDL Ratio: 5
Triglycerides: 206 mg/dL — ABNORMAL HIGH (ref 0.0–149.0)
VLDL: 41.2 mg/dL — ABNORMAL HIGH (ref 0.0–40.0)

## 2021-10-19 LAB — VITAMIN D 25 HYDROXY (VIT D DEFICIENCY, FRACTURES): VITD: 33.77 ng/mL (ref 30.00–100.00)

## 2021-10-19 LAB — VITAMIN B12: Vitamin B-12: 335 pg/mL (ref 211–911)

## 2021-10-19 LAB — PSA: PSA: 0.57 ng/mL (ref 0.10–4.00)

## 2021-10-19 LAB — LDL CHOLESTEROL, DIRECT: Direct LDL: 78 mg/dL

## 2021-10-19 LAB — HEMOGLOBIN A1C: Hgb A1c MFr Bld: 6.1 % (ref 4.6–6.5)

## 2021-10-19 MED ORDER — GABAPENTIN 100 MG PO CAPS: 100.0000 mg | ORAL_CAPSULE | Freq: Three times a day (TID) | ORAL | 3 refills | Status: DC

## 2021-10-19 MED ORDER — DIAZEPAM 5 MG PO TABS: ORAL_TABLET | ORAL | 0 refills | Status: DC

## 2021-10-19 MED ORDER — GUAIFENESIN-CODEINE 100-10 MG/5ML PO SOLN: 10.0000 mL | Freq: Four times a day (QID) | ORAL | 0 refills | Status: DC | PRN

## 2021-10-19 NOTE — Assessment & Plan Note (Signed)
Age and sex appropriate education and counseling updated with regular exercise and diet Referrals for preventative services - declines colonoscopy Immunizations addressed - declines tdap Smoking counseling  - none needed Evidence for depression or other mood disorder - none significant Most recent labs reviewed. I have personally reviewed and have noted: 1) the patient's medical and social history 2) The patient's current medications and supplements 3) The patient's height, weight, and BMI have been recorded in the chart

## 2021-10-19 NOTE — Progress Notes (Signed)
I, Christopher Mcfarland, LAT, ATC, am serving as scribe for Dr. Clementeen Graham.  Christopher Mcfarland is a 76 y.o. male who presents to [facility] at [facility] today for f/u of low back pain.  He was last seen by Dr. Denyse Amass on 04/14/20 for L knee pain and prior to that in 2020 for LBP.  Pt has a hx of a pinched nerve in his low back, 3 years ago. Today, pt reports L-sided lower back pain w/ radiating pain into his L LE x 2-3 weeks. Pt notes the pain will typically radiate into the L leg to the medial calf, to big toe, but will also intermittently radiating also into the R leg.  Radiating pain: yes L LE numbness/tingling: yes L LE weakness: yes Aggravating factors: transitioning from sitting to standing; walking Treatments tried: prior ESI  Diagnostic testing: L-spine MRI- 08/24/19; L-spine XR- 07/28/19  Pertinent review of systems: No fevers or chills  Relevant historical information: Liver fibrosis.  Chronic hepatitis C.   Exam:  BP (!) 158/102    Pulse (!) 104    Ht 5\' 8"  (1.727 m)    Wt 217 lb 9.6 oz (98.7 kg)    SpO2 96%    BMI 33.09 kg/m  General: Well Developed, well nourished, and in no acute distress.   MSK: L-spine: Nontender midline normal lumbar motion. Lower extremity strength is intact.  Mildly positive Spurling's test left. Reflexes are intact.    Lab and Radiology Results  EXAM: MRI LUMBAR SPINE WITHOUT CONTRAST   TECHNIQUE: Multiplanar, multisequence MR imaging of the lumbar spine was performed. No intravenous contrast was administered.   COMPARISON:  July 28, 2019   FINDINGS: Segmentation: There are 5 non-rib bearing lumbar type vertebral bodies with the last intervertebral disc space labeled as L5-S1.   Alignment:  There is straightening of the normal lumbar lordosis.   Vertebrae: The vertebral body heights are well maintained. No fracture, marrow edema,or pathologic marrow infiltration. Disc height loss with anterior osteophytes are seen  throughout the lumbar spine. Endplate Schmorl's nodes are noted.   Conus medullaris and cauda equina: Conus extends to the L1 level. Conus and cauda equina appear normal.   Paraspinal and other soft tissues: The paraspinal soft tissues and visualized retroperitoneal structures are unremarkable. The sacroiliac joints are intact.   Disc levels:   T12-L1:  No significant canal or neural foraminal narrowing.   L1-L2:   No significant canal or neural foraminal narrowing.   L2-L3: There is a broad-based disc bulge with facet arthrosis and ligamentum flavum hypertrophy causes moderate to severe bilateral neural foraminal narrowing. The central thecal sac is effaced measuring 7 mm in AP diameter.   L3-L4: There is a broad-based disc bulge with facet arthrosis and ligamentum flavum hypertrophy which causes moderate to severe bilateral neural foraminal narrowing. There is ligamentum flavum hypertrophy and facet arthrosis present. The central thecal sac measures 6 mm in AP diameter.   L4-L5: There is a broad-based disc bulge with a left paracentral disc protrusion which contacts and impinges the descending left L5 nerve root. There is facet arthrosis and ligamentum flavum hypertrophy which causes severe bilateral foraminal narrowing. The central thecal sac measures 4 mm in AP diameter. Redundancy of the nerve roots seen at this.   L5-S1: There is a broad-based disc bulge with a right paracentral disc protrusion which contacts the descending right S1 nerve root. There is moderate bilateral neural foraminal narrowing. There is effacement anterior thecal sac.  IMPRESSION: Straightening of the normal lumbar lordosis.   Lumbar spine spondylosis most notable at L4-L5 with a left paracentral disc protrusion contacting and impinging the descending left L5 nerve root. There is also severe bilateral neural foraminal narrowing and moderate to severe central canal stenosis.      Electronically Signed   By: Jonna Clark M.D.   On: 08/24/2019 10:35   I, Clementeen Graham, personally (independently) visualized and performed the interpretation of the images attached in this note.    Assessment and Plan: 76 y.o. male with lumbar radiculopathy left side.  Her his current symptoms are most consistent with L4 dermatomal pattern.  Prior lumbar spine MRI dated from December 2020 showed dominant findings involving the left L5 nerve root.  I think it is worthwhile trying to repeat an epidural steroid injection focused at left L4 nerve today and if he does not improved I would recommend repeating lumbar MRI prior to more epidural steroid injections. Recheck back as needed.   PDMP not reviewed this encounter. Orders Placed This Encounter  Procedures   DG INJECT DIAG/THERA/INC NEEDLE/CATH/PLC EPI/LUMB/SAC W/IMG    LUMB EPI #1 LEFT L4 [facility] 217 LBS PACS (08/24/19)     Standing Status:   Future    Standing Expiration Date:   10/20/2022    Order Specific Question:   Reason for Exam (SYMPTOM  OR DIAGNOSIS REQUIRED)    Answer:   left L4 lumbar rad. level and technique per radiology    Order Specific Question:   Preferred Imaging Location?    Answer:   [id number]    Order Specific Question:   Radiology Contrast Protocol - do NOT remove file path    Answer:   [email or web address]   No orders of the defined types were placed in this encounter.    Discussed warning signs or symptoms. Please see discharge instructions. Patient expresses understanding.   The above documentation has been reviewed and is accurate and complete Clementeen Graham, M.D.

## 2021-10-19 NOTE — Assessment & Plan Note (Signed)
Lab Results  Component Value Date   HGBA1C 6.1 06/05/2021   Stable, pt to continue current medical treatment  - diet

## 2021-10-19 NOTE — Progress Notes (Signed)
Patient ID: Christopher Mcfarland, male   DOB: [birth year], 76 y.o.   MRN: [id number]         Chief Complaint:: wellness exam and Back Pain  Worsening with LLE weakness and pain       HPI:  Christopher Mcfarland is a 76 y.o. male here for wellness exam; declines colonoscopy and tdap, o/w up to date                        Also c/o 2-3 wks onset worsening left lower back pain with radiation to the distal LLE with mild weakness, slight gait change but no falls.  Pain moderate to occasionally severe, constant but does not want narcotic pain med.  Worse to stand up and walk, better to lie flat.  Has hx of dec 2020 similar milder episode, and saw ortho, rec'd for surgury for lumbar disc dz with left L5 nerve root impingement per pt,, but declined surgury and did well with cortisone and PT, and he is hoping ot repeat this at this time as well.  Not taking Vit D.  Trying to follow lower chol diet and tolerating statin well.  Pt denies chest pain, increased sob or doe, wheezing, orthopnea, PND, increased LE swelling, palpitations, dizziness or syncope.   Pt denies polydipsia, polyuria, or other new neuro s/s.   Pt denies fever, wt loss, night sweats, loss of appetite, or other constitutional symptoms      Wt Readings from Last 3 Encounters:  10/19/21 222 lb (100.7 kg)  06/05/21 216 lb (98 kg)  04/17/21 207 lb 3.2 oz (94 kg)   BP Readings from Last 3 Encounters:  10/19/21 124/70  06/05/21 (!) 146/82  04/17/21 122/70   Immunization History  Administered Date(s) Administered   Fluad Quad(high Dose 65+) 06/27/2019, 06/05/2021   Influenza Whole 08/15/2009   Influenza, High Dose Seasonal PF 07/11/2018   Influenza,inj,Quad PF,6+ Mos 08/27/2015   Moderna Sars-Covid-2 Vaccination 01/29/2021   PFIZER(Purple Top)SARS-COV-2 Vaccination 11/05/2019, 11/26/2019   Pneumococcal Conjugate-13 12/12/2017   Pneumococcal Polysaccharide-23 03/07/2019   Td 10/21/2009   Tdap 03/07/2020   Unspecified SARS-COV-2 Vaccination  10/15/2019, 11/08/2019   Zoster Recombinat (Shingrix) 03/06/2018, 06/05/2021   Zoster, Live 01/18/2016  There are no preventive care reminders to display for this patient.    Past Medical History:  Diagnosis Date   Essential hypertension    Gout 01/02/2020   Gout crystals on joint fluid April 2021   Hepatitis C 2003   Treated   Hyperlipidemia    Past Surgical History:  Procedure Laterality Date   CARPAL TUNNEL RELEASE     bilat 1999   HEMORRHOID SURGERY     1999/4 polyps removed rectum   TRIGGER FINGER RELEASE     right hand 1999    reports that he has never smoked. He has never used smokeless tobacco. He reports that he does not currently use alcohol after a past usage of about 2.0 - 3.0 standard drinks per week. He reports that he does not use drugs. family history includes Bone cancer in his father; CAD (age of onset: 59) in his brother; Kidney cancer in his mother. No Known Allergies Current Outpatient Medications on File Prior to Visit  Medication Sig Dispense Refill   allopurinol (ZYLOPRIM) 300 MG tablet TAKE 1 TABLET(300 MG) BY MOUTH DAILY 90 tablet 3   amLODipine (NORVASC) 5 MG tablet TAKE 1 TABLET(5 MG) BY MOUTH DAILY 90 tablet 3   Cholecalciferol 50  MCG (2000 UT) TABS 1 tab by mouth once daily 30 tablet 99   Colchicine 0.6 MG CAPS TAKE 1 CAPSULE BY MOUTH DAILY AS NEEDED FOR GOUT 90 capsule 1   diclofenac Sodium (VOLTAREN) 1 % GEL Apply 4 g topically 4 (four) times daily. 100 g 0   ipratropium (ATROVENT) 0.06 % nasal spray Place 2 sprays into both nostrils 3 (three) times daily.     mupirocin ointment (BACTROBAN) 2 % APPLY WITHIN BOTH NASAL PASSAGES TWICE DAILY FOR 2 WEEKS     rosuvastatin (CRESTOR) 20 MG tablet Take 1 tablet (20 mg total) by mouth daily. 90 tablet 3   sildenafil (VIAGRA) 100 MG tablet TAKE ONE-HALF TO ONE TABLET BY MOUTH ONE TIME DAILY AS NEEDED FOR ERECTILE DYSFUNCTION 5 tablet 10   benzonatate (TESSALON) 100 MG capsule Take 1 capsule (100 mg total) by  mouth 3 (three) times daily as needed. (Patient not taking: Reported on 06/05/2021) 20 capsule 0   No current facility-administered medications on file prior to visit.        ROS:  All others reviewed and negative.  Objective        PE:  BP 124/70 (BP Location: Right Arm, Patient Position: Sitting, Cuff Size: Large)    Pulse (!) 105    Temp 97.9 F (36.6 C) (Oral)    Ht 5\' 8"  (1.727 m)    Wt 222 lb (100.7 kg)    SpO2 96%    BMI 33.75 kg/m                 Constitutional: Pt appears in NAD               HENT: Head: NCAT.                Right Ear: External ear normal.                 Left Ear: External ear normal.                Eyes: . Pupils are equal, round, and reactive to light. Conjunctivae and EOM are normal               Nose: without d/c or deformity               Neck: Neck supple. Gross normal ROM               Cardiovascular: Normal rate and regular rhythm.                 Pulmonary/Chest: Effort normal and breath sounds without rales or wheezing.                Abd:  Soft, NT, ND, + BS, no organomegaly               Neurological: Pt is alert. At baseline orientation, motor grossly intact except for 3/5 LLE weakness and decreased sens to LT Spine nontender in midline or paravertebral to palpate               Skin: Skin is warm. No rashes, no other new lesions, LE edema - none               Psychiatric: Pt behavior is normal without agitation   Micro: none  Cardiac tracings I have personally interpreted today:  none  Pertinent Radiological findings (summarize): none   Lab Results  Component Value Date   WBC 4.4 06/05/2021   HGB  13.4 06/05/2021   HCT 41.3 06/05/2021   PLT 182.0 06/05/2021   GLUCOSE 92 06/05/2021   CHOL 172 06/05/2021   TRIG 123.0 06/05/2021   HDL 37.10 (L) 06/05/2021   LDLCALC 110 (H) 06/05/2021   ALT 21 06/05/2021   AST 22 06/05/2021   NA 139 06/05/2021   K 3.9 06/05/2021   CL 103 06/05/2021   CREATININE 1.14 06/05/2021   BUN 19 06/05/2021    CO2 28 06/05/2021   TSH 1.85 06/05/2021   PSA 0.61 06/05/2021   INR 1.17 08/20/2015   HGBA1C 6.1 06/05/2021   Assessment/Plan:  Christopher Mcfarland is a 76 y.o. Black or African American [2] male with  has a past medical history of Essential hypertension, Gout (01/02/2020), Hepatitis C (2003), and Hyperlipidemia.  Encounter for well adult exam with abnormal findings Age and sex appropriate education and counseling updated with regular exercise and diet Referrals for preventative services - declines colonoscopy Immunizations addressed - declines tdap Smoking counseling  - none needed Evidence for depression or other mood disorder - none significant Most recent labs reviewed. I have personally reviewed and have noted: 1) the patient's medical and social history 2) The patient's current medications and supplements 3) The patient's height, weight, and BMI have been recorded in the chart   Left lumbar radiculopathy Moderate worsening, for gabapentin 100 tid, repeat MRI LS spine and refer Sports medicine for possible injection  Hyperglycemia Lab Results  Component Value Date   HGBA1C 6.1 06/05/2021   Stable, pt to continue current medical treatment  - diet   Hyperlipidemia Lab Results  Component Value Date   LDLCALC 110 (H) 06/05/2021   Uncontrolled with good med compliance, goal LDL < 100, pt to continue current crestor 20 and f/u lab today   Vitamin D deficiency Last vitamin D Lab Results  Component Value Date   VD25OH 28.43 (L) 06/05/2021   Low, to start oral replacement  Followup: No follow-ups on file.  Oliver Barre, MD 10/19/2021 1:07 PM [facility]

## 2021-10-19 NOTE — Assessment & Plan Note (Signed)
Moderate worsening, for gabapentin 100 tid, repeat MRI LS spine and refer Sports medicine for possible injection

## 2021-10-19 NOTE — Patient Instructions (Signed)
Please take all new medication as prescribed - the gabapentin three times per day for nerve pain  Please take all new medication as prescribed - the valium 5 mg about 30 min before the MRI  You will be contacted regarding the referral for: MRI, and Sports Medicine  Please continue all other medications as before, and refills have been done if requested.  Please have the pharmacy call with any other refills you may need.  Please continue your efforts at being more active, low cholesterol diet, and weight control.  You are otherwise up to date with prevention measures today.  Please keep your appointments with your specialists as you may have planned  Please go to the LAB at the blood drawing area for the tests to be done  You will be contacted by phone if any changes need to be made immediately.  Otherwise, you will receive a letter about your results with an explanation, but please check with MyChart first.  Please remember to sign up for MyChart if you have not done so, as this will be important to you in the future with finding out test results, communicating by private email, and scheduling acute appointments online when needed.  Please make an Appointment to return for your 1 year visit, or sooner if needed

## 2021-10-19 NOTE — Assessment & Plan Note (Signed)
Lab Results  Component Value Date   LDLCALC 110 (H) 06/05/2021   Uncontrolled with good med compliance, goal LDL < 100, pt to continue current crestor 20 and f/u lab today

## 2021-10-19 NOTE — Assessment & Plan Note (Signed)
Last vitamin D Lab Results  Component Value Date   VD25OH 28.43 (L) 06/05/2021   Low, to start oral replacement

## 2021-10-20 ENCOUNTER — Ambulatory Visit (INDEPENDENT_AMBULATORY_CARE_PROVIDER_SITE_OTHER): Payer: Medicare HMO | Admitting: Family Medicine

## 2021-10-20 VITALS — BP 158/102 | HR 104 | Ht 68.0 in | Wt 217.6 lb

## 2021-10-20 DIAGNOSIS — M5416 Radiculopathy, lumbar region: Secondary | ICD-10-CM | POA: Diagnosis not present

## 2021-10-20 NOTE — Patient Instructions (Addendum)
Thank you for coming in today.   I've ordered your back injection to [facility].  Please call them at [phone] or [phone] to schedule.  Follow-up: 6 weeks if not feeling better

## 2021-10-21 ENCOUNTER — Other Ambulatory Visit: Payer: Self-pay | Admitting: Family Medicine

## 2021-10-21 ENCOUNTER — Ambulatory Visit
Admission: RE | Admit: 2021-10-21 | Discharge: 2021-10-21 | Disposition: A | Discharge status: Home / Self Care | Payer: Medicare HMO | Source: Ambulatory Visit | Attending: Family Medicine | Admitting: Family Medicine

## 2021-10-21 DIAGNOSIS — M5416 Radiculopathy, lumbar region: Secondary | ICD-10-CM

## 2021-10-21 DIAGNOSIS — M48061 Spinal stenosis, lumbar region without neurogenic claudication: Secondary | ICD-10-CM | POA: Diagnosis not present

## 2021-10-21 DIAGNOSIS — M5116 Intervertebral disc disorders with radiculopathy, lumbar region: Secondary | ICD-10-CM | POA: Diagnosis not present

## 2021-10-21 MED ORDER — IOPAMIDOL (ISOVUE-M 200) INJECTION 41%
1.0000 mL | Freq: Once | INTRAMUSCULAR | Status: AC
  Administered 2021-10-21: 1 mL via EPIDURAL

## 2021-10-21 MED ORDER — METHYLPREDNISOLONE ACETATE 40 MG/ML INJ SUSP (RADIOLOG
80.0000 mg | Freq: Once | INTRAMUSCULAR | Status: AC
  Administered 2021-10-21: 80 mg via EPIDURAL

## 2021-10-21 NOTE — Discharge Instructions (Signed)

## 2022-01-06 ENCOUNTER — Telehealth: Payer: Self-pay | Admitting: Internal Medicine

## 2022-01-06 DIAGNOSIS — R058 Other specified cough: Secondary | ICD-10-CM

## 2022-01-06 MED ORDER — GUAIFENESIN-CODEINE 100-10 MG/5ML PO SOLN: 10.0000 mL | Freq: Four times a day (QID) | ORAL | 0 refills | Status: DC | PRN

## 2022-01-06 NOTE — Telephone Encounter (Signed)
1.Medication Requested: guaiFENesin-codeine 100-10 MG/5ML syrup ? ?2. Pharmacy (Name, Street, [facility]): [facility] [address] [place] ? ?3. On Med List: Y ? ?4. Last Visit with PCP: 10-19-2021 ? ?5. Next visit date with PCP: n/a ? ? ?Agent: Please be advised that RX refills may take up to 3 business days. We ask that you follow-up with your pharmacy.  ?

## 2022-01-06 NOTE — Telephone Encounter (Signed)
Done erx 

## 2022-01-07 NOTE — Telephone Encounter (Signed)
Notified pt rx has been sent to [facility] 

## 2022-02-23 ENCOUNTER — Encounter: Payer: Self-pay | Admitting: Internal Medicine

## 2022-02-23 ENCOUNTER — Ambulatory Visit (INDEPENDENT_AMBULATORY_CARE_PROVIDER_SITE_OTHER): Payer: Medicare HMO | Admitting: Internal Medicine

## 2022-02-23 VITALS — BP 140/70 | HR 88 | Temp 98.2°F | Ht 68.0 in | Wt 213.0 lb

## 2022-02-23 DIAGNOSIS — I10 Essential (primary) hypertension: Secondary | ICD-10-CM

## 2022-02-23 DIAGNOSIS — E782 Mixed hyperlipidemia: Secondary | ICD-10-CM

## 2022-02-23 DIAGNOSIS — R9431 Abnormal electrocardiogram [ECG] [EKG]: Secondary | ICD-10-CM | POA: Diagnosis not present

## 2022-02-23 DIAGNOSIS — R739 Hyperglycemia, unspecified: Secondary | ICD-10-CM

## 2022-02-23 DIAGNOSIS — E559 Vitamin D deficiency, unspecified: Secondary | ICD-10-CM

## 2022-02-23 MED ORDER — ROSUVASTATIN CALCIUM 40 MG PO TABS: 40.0000 mg | ORAL_TABLET | Freq: Every day | ORAL | 3 refills | Status: DC

## 2022-02-23 NOTE — Progress Notes (Signed)
Patient ID: Christopher Mcfarland, male   DOB: 12-May-1946, 76 y.o.   MRN: [id number]        Chief Complaint: follow up low vit d, hld, hyperglycemia       HPI:  Christopher Mcfarland is a 76 y.o. male here overall doing ok, Pt denies chest pain, increased sob or doe, wheezing, orthopnea, PND, increased LE swelling, palpitations, dizziness or syncope.   Pt denies polydipsia, polyuria, or new focal neuro s/s.    Pt denies fever, wt loss, night sweats, loss of appetite, or other constitutional symptoms  Not taking Vit D.  Trying to follow lower chol diet. And has been actively trying to lose wt with better diet, more activity.  Also better med compliance recently including norvasc       Wt Readings from Last 3 Encounters:  02/23/22 213 lb (96.6 kg)  10/20/21 217 lb 9.6 oz (98.7 kg)  10/19/21 222 lb (100.7 kg)   BP Readings from Last 3 Encounters:  02/23/22 140/70  10/21/21 (!) 164/108  10/20/21 (!) 158/102         Past Medical History:  Diagnosis Date   Essential hypertension    Gout 01/02/2020   Gout crystals on joint fluid April 2021   Hepatitis C 2003   Treated   Hyperlipidemia    Past Surgical History:  Procedure Laterality Date   CARPAL TUNNEL RELEASE     bilat 1999   HEMORRHOID SURGERY     1999/4 polyps removed rectum   TRIGGER FINGER RELEASE     right hand 1999    reports that he has never smoked. He has never used smokeless tobacco. He reports that he does not currently use alcohol after a past usage of about 2.0 - 3.0 standard drinks of alcohol per week. He reports that he does not use drugs. family history includes Bone cancer in his father; CAD (age of onset: 110) in his brother; Kidney cancer in his mother. No Known Allergies Current Outpatient Medications on File Prior to Visit  Medication Sig Dispense Refill   allopurinol (ZYLOPRIM) 300 MG tablet TAKE 1 TABLET(300 MG) BY MOUTH DAILY 90 tablet 3   amLODipine (NORVASC) 5 MG tablet TAKE 1 TABLET(5 MG) BY MOUTH DAILY 90 tablet 3    Cholecalciferol 50 MCG (2000 UT) TABS 1 tab by mouth once daily 30 tablet 99   Colchicine 0.6 MG CAPS TAKE 1 CAPSULE BY MOUTH DAILY AS NEEDED FOR GOUT 90 capsule 1   diazepam (VALIUM) 5 MG tablet 1 tab by mouth x 1 about 45 min prior to procedure 1 tablet 0   diclofenac Sodium (VOLTAREN) 1 % GEL Apply 4 g topically 4 (four) times daily. 100 g 0   guaiFENesin-codeine 100-10 MG/5ML syrup Take 10 mLs by mouth every 6 (six) hours as needed for cough. 118 mL 0   ipratropium (ATROVENT) 0.06 % nasal spray Place 2 sprays into both nostrils 3 (three) times daily.     mupirocin ointment (BACTROBAN) 2 % APPLY WITHIN BOTH NASAL PASSAGES TWICE DAILY FOR 2 WEEKS     sildenafil (VIAGRA) 100 MG tablet TAKE ONE-HALF TO ONE TABLET BY MOUTH ONE TIME DAILY AS NEEDED FOR ERECTILE DYSFUNCTION 5 tablet 10   No current facility-administered medications on file prior to visit.        ROS:  All others reviewed and negative.  Objective        PE:  BP 140/70 (BP Location: Right Arm, Patient Position: Sitting, Cuff Size: Large)  Pulse 88   Temp 98.2 F (36.8 C) (Oral)   Ht 5\' 8"  (1.727 m)   Wt 213 lb (96.6 kg)   SpO2 97%   BMI 32.39 kg/m                 Constitutional: Pt appears in NAD               HENT: Head: NCAT.                Right Ear: External ear normal.                 Left Ear: External ear normal.                Eyes: . Pupils are equal, round, and reactive to light. Conjunctivae and EOM are normal               Nose: without d/c or deformity               Neck: Neck supple. Gross normal ROM               Cardiovascular: Normal rate and regular rhythm.                 Pulmonary/Chest: Effort normal and breath sounds without rales or wheezing.                Abd:  Soft, NT, ND, + BS, no organomegaly               Neurological: Pt is alert. At baseline orientation, motor grossly intact               Skin: Skin is warm. No rashes, no other new lesions, LE edema - none               Psychiatric:  Pt behavior is normal without agitation   Micro: none  Cardiac tracings I have personally interpreted today:  none  Pertinent Radiological findings (summarize): none   Lab Results  Component Value Date   WBC 4.3 10/19/2021   HGB 13.3 10/19/2021   HCT 40.9 10/19/2021   PLT 180.0 10/19/2021   GLUCOSE 124 (H) 10/19/2021   CHOL 136 10/19/2021   TRIG 206.0 (H) 10/19/2021   HDL 29.00 (L) 10/19/2021   LDLDIRECT 78.0 10/19/2021   LDLCALC 110 (H) 06/05/2021   ALT 23 10/19/2021   AST 20 10/19/2021   NA 140 10/19/2021   K 4.2 10/19/2021   CL 106 10/19/2021   CREATININE 1.13 10/19/2021   BUN 18 10/19/2021   CO2 28 10/19/2021   TSH 1.60 10/19/2021   PSA 0.57 10/19/2021   INR 1.17 08/20/2015   HGBA1C 6.1 10/19/2021   Assessment/Plan:  Christopher Mcfarland is a 76 y.o. Black or African American [2] male with  has a past medical history of Essential hypertension, Gout (01/02/2020), Hepatitis C (2003), and Hyperlipidemia.  Hyperlipidemia Lab Results  Component Value Date   LDLCALC 110 (H) 06/05/2021   Uncontrolled, goal ldl < 100, pt to increase crestor to 40 mg qd, low chol diet and f/U lipids next visit, also for cardiac CT score testing   Vitamin D deficiency Last vitamin D Lab Results  Component Value Date   VD25OH 33.77 10/19/2021   Low, to start oral replacement   Hyperglycemia Lab Results  Component Value Date   HGBA1C 6.1 10/19/2021   Stable, pt to continue current medical treatment  - diet, wt control, excercise  Essential hypertension BP Readings from Last 3 Encounters:  02/23/22 140/70  10/21/21 (!) 164/108  10/20/21 (!) 158/102   Stable, pt to continue medical treatment norvasc 5 qd, decliens further med for now, wants to continue to work on wt loss  Followup: Return in about 6 months (around 08/25/2022).  Cathlean Cower, MD 02/27/2022 2:50 PM [facility]

## 2022-02-23 NOTE — Patient Instructions (Signed)
Please take OTC Vitamin D3 at 2000 units per day, indefinitely  Ok to increase the generic crestor to 40 mg for the cholesterol  You will be contacted regarding the referral for: Cardiac CT score  Please continue all other medications as before, and refills have been done if requested.  Please have the pharmacy call with any other refills you may need.  Please continue your efforts at being more active, low cholesterol diet, and weight control.  Please keep your appointments with your specialists as you may have planned  Please make an Appointment to return in 6 months, or sooner if needed

## 2022-02-27 ENCOUNTER — Encounter: Payer: Self-pay | Admitting: Internal Medicine

## 2022-02-27 NOTE — Assessment & Plan Note (Addendum)
Lab Results  Component Value Date   LDLCALC 110 (H) 06/05/2021   Uncontrolled, goal ldl < 100, pt to increase crestor to 40 mg qd, low chol diet and f/U lipids next visit, also for cardiac CT score testing

## 2022-02-27 NOTE — Assessment & Plan Note (Signed)
Lab Results  Component Value Date   HGBA1C 6.1 10/19/2021   Stable, pt to continue current medical treatment  - diet, wt control, excercise  

## 2022-02-27 NOTE — Assessment & Plan Note (Signed)
BP Readings from Last 3 Encounters:  02/23/22 140/70  10/21/21 (!) 164/108  10/20/21 (!) 158/102   Stable, pt to continue medical treatment norvasc 5 qd, decliens further med for now, wants to continue to work on wt loss

## 2022-02-27 NOTE — Assessment & Plan Note (Signed)
Last vitamin D Lab Results  Component Value Date   VD25OH 33.77 10/19/2021   Low, to start oral replacement

## 2022-03-15 DIAGNOSIS — H903 Sensorineural hearing loss, bilateral: Secondary | ICD-10-CM | POA: Diagnosis not present

## 2022-03-19 ENCOUNTER — Encounter: Payer: Self-pay | Admitting: Internal Medicine

## 2022-03-19 ENCOUNTER — Other Ambulatory Visit: Payer: Self-pay | Admitting: Internal Medicine

## 2022-03-19 ENCOUNTER — Ambulatory Visit (HOSPITAL_BASED_OUTPATIENT_CLINIC_OR_DEPARTMENT_OTHER)
Admission: RE | Admit: 2022-03-19 | Discharge: 2022-03-19 | Disposition: A | Discharge status: Home / Self Care | Payer: Medicare HMO | Source: Ambulatory Visit | Attending: Internal Medicine | Admitting: Internal Medicine

## 2022-03-19 DIAGNOSIS — R739 Hyperglycemia, unspecified: Secondary | ICD-10-CM | POA: Insufficient documentation

## 2022-03-19 DIAGNOSIS — R931 Abnormal findings on diagnostic imaging of heart and coronary circulation: Secondary | ICD-10-CM

## 2022-03-19 DIAGNOSIS — E782 Mixed hyperlipidemia: Secondary | ICD-10-CM | POA: Insufficient documentation

## 2022-03-19 DIAGNOSIS — R9431 Abnormal electrocardiogram [ECG] [EKG]: Secondary | ICD-10-CM | POA: Insufficient documentation

## 2022-03-19 DIAGNOSIS — I10 Essential (primary) hypertension: Secondary | ICD-10-CM | POA: Insufficient documentation

## 2022-03-19 MED ORDER — ASPIRIN 81 MG PO TBEC: 81.0000 mg | DELAYED_RELEASE_TABLET | Freq: Every day | ORAL | 12 refills | Status: DC

## 2022-04-02 ENCOUNTER — Encounter: Payer: Self-pay | Admitting: Family Medicine

## 2022-04-02 ENCOUNTER — Ambulatory Visit: Payer: Medicare HMO | Admitting: Family Medicine

## 2022-04-02 ENCOUNTER — Ambulatory Visit: Payer: Self-pay

## 2022-04-02 VITALS — BP 138/80 | HR 100 | Ht 68.0 in | Wt 212.2 lb

## 2022-04-02 DIAGNOSIS — M533 Sacrococcygeal disorders, not elsewhere classified: Secondary | ICD-10-CM

## 2022-04-02 DIAGNOSIS — G8929 Other chronic pain: Secondary | ICD-10-CM

## 2022-04-02 DIAGNOSIS — M545 Low back pain, unspecified: Secondary | ICD-10-CM

## 2022-04-02 NOTE — Patient Instructions (Addendum)
Good to see you today.  You had a R SIJ injection.  Call or go to the ER if you develop a large red swollen joint with extreme pain or oozing puss.   Follow-up: as needed

## 2022-04-02 NOTE — Progress Notes (Signed)
I, [name], LAT, ATC, am serving as scribe for Dr. Clementeen Graham.  Christopher Mcfarland is a 76 y.o. male who presents to [facility] today for f/u of lumbar radiculopathy.  He was last seen by Dr. Denyse Amass on 10/20/21 w/ L-sided LBP and radiating pain into his L lower leg and was referred for a lumbar ESI (L L4-5) that he had on 10/21/21.  Today, pt reports R-sided LBP after lifting an older gentleman off the floor that he's been looking in on.  He states that he is not having any pain running into his legs at this point, just isolated R lower back.  He has been using Voltaren gel and going to the gym.  Diagnostic testing: L-spine MRI- 08/24/19; L-spine XR- 07/28/19  Pertinent review of systems: No fevers or chills  Relevant historical information: Hepatitis C   Exam:  BP 138/80 (BP Location: Right Arm, Patient Position: Sitting, Cuff Size: Normal)   Pulse 100   Ht 5\' 8"  (1.727 m)   Wt 212 lb 3.2 oz (96.3 kg)   SpO2 94%   BMI 32.26 kg/m  General: Well Developed, well nourished, and in no acute distress.   MSK: L-spine: Nontender midline.  Tender palpation right SI joint region. Decreased lumbar motion. Lower extremity strength is intact.    Lab and Radiology Results  Procedure: Real-time Ultrasound Guided Injection of right SI joint Device: Philips Affiniti 50G Images permanently stored and available for review in PACS Verbal informed consent obtained.  Discussed risks and benefits of procedure. Warned about infection, bleeding, hyperglycemia damage to structures among others. Patient expresses understanding and agreement Time-out conducted.   Noted no overlying erythema, induration, or other signs of local infection.   Skin prepped in a sterile fashion.   Local anesthesia: Topical Ethyl chloride.   With sterile technique and under real time ultrasound guidance: 40 mg of Kenalog and 2 mL of Marcaine injected into SI joint. Fluid seen entering the  joint capsule.   Completed without difficulty   Pain immediately resolved suggesting accurate placement of the medication.   Advised to call if fevers/chills, erythema, induration, drainage, or persistent bleeding.   Images permanently stored and available for review in the ultrasound unit.  Impression: Technically successful ultrasound guided injection.         Assessment and Plan: 76 y.o. male with right low back pain.  This is an acute exacerbation of a chronic problem.  Pain thought to be due to either muscle spasm dysfunction or SI joint dysfunction.  Pain is located directly overlying the SI joint and he did have a benefit immediately following injection.  Plan for steroid injection today.  If not improving enough we will add physical therapy referral.  He will let me know.  Check back as needed.   PDMP not reviewed this encounter. Orders Placed This Encounter  Procedures   66 LIMITED JOINT SPACE STRUCTURES LOW RIGHT(NO LINKED CHARGES)    Order Specific Question:   Reason for Exam (SYMPTOM  OR DIAGNOSIS REQUIRED)    Answer:   R low back pain    Order Specific Question:   Preferred imaging location?    Answer:   [facility]   No orders of the defined types were placed in this encounter.    Discussed warning signs or symptoms. Please see discharge instructions. Patient expresses understanding.   The above documentation has been reviewed and is accurate and complete US, M.D.

## 2022-04-19 ENCOUNTER — Ambulatory Visit (INDEPENDENT_AMBULATORY_CARE_PROVIDER_SITE_OTHER): Payer: Medicare HMO

## 2022-04-19 DIAGNOSIS — Z0000 Encounter for general adult medical examination without abnormal findings: Secondary | ICD-10-CM | POA: Diagnosis not present

## 2022-04-19 NOTE — Patient Instructions (Signed)
Health Maintenance, Male Adopting a healthy lifestyle and getting preventive care are important in promoting health and wellness. Ask your health care provider about: The right schedule for you to have regular tests and exams. Things you can do on your own to prevent diseases and keep yourself healthy. What should I know about diet, weight, and exercise? Eat a healthy diet  Eat a diet that includes plenty of vegetables, fruits, low-fat dairy products, and lean protein. Do not eat a lot of foods that are high in solid fats, added sugars, or sodium. Maintain a healthy weight Body mass index (BMI) is a measurement that can be used to identify possible weight problems. It estimates body fat based on height and weight. Your health care provider can help determine your BMI and help you achieve or maintain a healthy weight. Get regular exercise Get regular exercise. This is one of the most important things you can do for your health. Most adults should: Exercise for at least 150 minutes each week. The exercise should increase your heart rate and make you sweat (moderate-intensity exercise). Do strengthening exercises at least twice a week. This is in addition to the moderate-intensity exercise. Spend less time sitting. Even light physical activity can be beneficial. Watch cholesterol and blood lipids Have your blood tested for lipids and cholesterol at 74 years of age, then have this test every 5 years. You may need to have your cholesterol levels checked more often if: Your lipid or cholesterol levels are high. You are older than 74 years of age. You are at high risk for heart disease. What should I know about cancer screening? Many types of cancers can be detected early and may often be prevented. Depending on your health history and family history, you may need to have cancer screening at various ages. This may include screening for: Colorectal cancer. Prostate cancer. Skin cancer. Lung  cancer. What should I know about heart disease, diabetes, and high blood pressure? Blood pressure and heart disease High blood pressure causes heart disease and increases the risk of stroke. This is more likely to develop in people who have high blood pressure readings or are overweight. Talk with your health care provider about your target blood pressure readings. Have your blood pressure checked: Every 3-5 years if you are 18-39 years of age. Every year if you are 40 years old or older. If you are between the ages of 65 and 75 and are a current or former smoker, ask your health care provider if you should have a one-time screening for abdominal aortic aneurysm (AAA). Diabetes Have regular diabetes screenings. This checks your fasting blood sugar level. Have the screening done: Once every three years after age 45 if you are at a normal weight and have a low risk for diabetes. More often and at a younger age if you are overweight or have a high risk for diabetes. What should I know about preventing infection? Hepatitis B If you have a higher risk for hepatitis B, you should be screened for this virus. Talk with your health care provider to find out if you are at risk for hepatitis B infection. Hepatitis C Blood testing is recommended for: Everyone born from 1945 through 1965. Anyone with known risk factors for hepatitis C. Sexually transmitted infections (STIs) You should be screened each year for STIs, including gonorrhea and chlamydia, if: You are sexually active and are younger than 74 years of age. You are older than 74 years of age and your   health care provider tells you that you are at risk for this type of infection. Your sexual activity has changed since you were last screened, and you are at increased risk for chlamydia or gonorrhea. Ask your health care provider if you are at risk. Ask your health care provider about whether you are at high risk for HIV. Your health care provider  may recommend a prescription medicine to help prevent HIV infection. If you choose to take medicine to prevent HIV, you should first get tested for HIV. You should then be tested every 3 months for as long as you are taking the medicine. Follow these instructions at home: Alcohol use Do not drink alcohol if your health care provider tells you not to drink. If you drink alcohol: Limit how much you have to 0-2 drinks a day. Know how much alcohol is in your drink. In the [place] one drink equals one 12 oz bottle of beer (355 mL), one 5 oz glass of wine (148 mL), or one 1 oz glass of hard liquor (44 mL). Lifestyle Do not use any products that contain nicotine or tobacco. These products include cigarettes, chewing tobacco, and vaping devices, such as e-cigarettes. If you need help quitting, ask your health care provider. Do not use street drugs. Do not share needles. Ask your health care provider for help if you need support or information about quitting drugs. General instructions Schedule regular health, dental, and eye exams. Stay current with your vaccines. Tell your health care provider if: You often feel depressed. You have ever been abused or do not feel safe at home. Summary Adopting a healthy lifestyle and getting preventive care are important in promoting health and wellness. Follow your health care provider's instructions about healthy diet, exercising, and getting tested or screened for diseases. Follow your health care provider's instructions on monitoring your cholesterol and blood pressure. This information is not intended to replace advice given to you by your health care provider. Make sure you discuss any questions you have with your health care provider. Document Revised: 01/19/2021 Document Reviewed: 01/19/2021 Elsevier Patient Education  2023 Elsevier Inc.  

## 2022-04-19 NOTE — Progress Notes (Signed)
Subjective:   Christopher Mcfarland is a 76 y.o. male who presents for Medicare Annual/Subsequent preventive examination.  Review of Systems    Defer to PCP  I connected with  Christopher Mcfarland on 04/19/22 by a audio enabled telemedicine application and verified that I am speaking with the correct person using two identifiers.  Patient Location: Home  Provider Location: Office/Clinic  I discussed the limitations of evaluation and management by telemedicine. The patient expressed understanding and agreed to proceed.        Objective:    There were no vitals filed for this visit. There is no height or weight on file to calculate BMI.     04/17/2021    3:05 PM 03/21/2020   12:56 PM 12/22/2019    7:44 AM 07/28/2019    2:04 PM 03/07/2019    4:39 PM 07/30/2018    7:47 AM 08/12/2017    3:55 PM  Advanced Directives  Does Patient Have a Medical Advance Directive? No No No No No No No  Would patient like information on creating a medical advance directive? No - Patient declined No - Patient declined  No - Patient declined Yes (MAU/Ambulatory/Procedural Areas - Information given) No - Patient declined     Current Medications (verified) Outpatient Encounter Medications as of 04/19/2022  Medication Sig   allopurinol (ZYLOPRIM) 300 MG tablet TAKE 1 TABLET(300 MG) BY MOUTH DAILY   amLODipine (NORVASC) 5 MG tablet TAKE 1 TABLET(5 MG) BY MOUTH DAILY   aspirin EC 81 MG tablet Take 1 tablet (81 mg total) by mouth daily. Swallow whole.   Cholecalciferol 50 MCG (2000 UT) TABS 1 tab by mouth once daily   Colchicine 0.6 MG CAPS TAKE 1 CAPSULE BY MOUTH DAILY AS NEEDED FOR GOUT   diazepam (VALIUM) 5 MG tablet 1 tab by mouth x 1 about 45 min prior to procedure   diclofenac Sodium (VOLTAREN) 1 % GEL Apply 4 g topically 4 (four) times daily.   guaiFENesin-codeine 100-10 MG/5ML syrup Take 10 mLs by mouth every 6 (six) hours as needed for cough.   ipratropium (ATROVENT) 0.06 % nasal spray Place 2 sprays into  both nostrils 3 (three) times daily.   mupirocin ointment (BACTROBAN) 2 % APPLY WITHIN BOTH NASAL PASSAGES TWICE DAILY FOR 2 WEEKS   rosuvastatin (CRESTOR) 40 MG tablet Take 1 tablet (40 mg total) by mouth daily.   sildenafil (VIAGRA) 100 MG tablet TAKE ONE-HALF TO ONE TABLET BY MOUTH ONE TIME DAILY AS NEEDED FOR ERECTILE DYSFUNCTION   No facility-administered encounter medications on file as of 04/19/2022.    Allergies (verified) Patient has no known allergies.   History: Past Medical History:  Diagnosis Date   Essential hypertension    Gout 01/02/2020   Gout crystals on joint fluid April 2021   Hepatitis C 2003   Treated   Hyperlipidemia    Past Surgical History:  Procedure Laterality Date   CARPAL TUNNEL RELEASE     bilat 1999   HEMORRHOID SURGERY     1999/4 polyps removed rectum   TRIGGER FINGER RELEASE     right hand 1999   Family History  Problem Relation Age of Onset   CAD Brother 31   Kidney cancer Mother    Bone cancer Father    Colon cancer Neg Hx    Pancreatic cancer Neg Hx    Rectal cancer Neg Hx    Stomach cancer Neg Hx    Social History   Socioeconomic History  Marital status: Married    Spouse name: Not on file   Number of children: 4   Years of education: 12   Highest education level: Not on file  Occupational History   Occupation: Education administratorainter  Tobacco Use   Smoking status: Never   Smokeless tobacco: Never  Vaping Use   Vaping Use: Never used  Substance and Sexual Activity   Alcohol use: Not Currently    Alcohol/week: 2.0 - 3.0 standard drinks of alcohol    Types: 2 - 3 Shots of liquor per week   Drug use: No    Comment: smoked marijuana "years ago"   Sexual activity: Yes  Other Topics Concern   Not on file  Social History Narrative   Fun: Play pool, yard work, gym, and Curatorpaint   Social Determinants of Health   Financial Resource Strain: Low Risk  (04/17/2021)   Overall Financial Resource Strain (CARDIA)    Difficulty of Paying Living  Expenses: Not hard at all  Food Insecurity: No Food Insecurity (05/22/2020)   Hunger Vital Sign    Worried About Running Out of Food in the Last Year: Never true    Ran Out of Food in the Last Year: Never true  Transportation Needs: No Transportation Needs (04/17/2021)   PRAPARE - Administrator, Civil ServiceTransportation    Lack of Transportation (Medical): No    Lack of Transportation (Non-Medical): No  Physical Activity: Sufficiently Active (04/17/2021)   Exercise Vital Sign    Days of Exercise per Week: 5 days    Minutes of Exercise per Session: 30 min  Stress: No Stress Concern Present (04/17/2021)   [employer] - Occupational Stress Questionnaire    Feeling of Stress : Not at all  Social Connections: Socially Integrated (04/17/2021)   Social Connection and Isolation Panel [NHANES]    Frequency of Communication with Friends and Family: More than three times a week    Frequency of Social Gatherings with Friends and Family: More than three times a week    Attends Religious Services: More than 4 times per year    Active Member of [employer]ubs or Organizations: No    Attends Engineer, structuralClub or Organization Meetings: More than 4 times per year    Marital Status: Living with partner    Tobacco Counseling Counseling given: Not Answered   Clinical Intake:  Pre-visit preparation completed: Yes  Pain : No/denies pain     Nutritional Status: BMI > 30  Obese Nutritional Risks: None Diabetes: No  How often do you need to have someone help you when you read instructions, pamphlets, or other written materials from your doctor or pharmacy?: 1 - Never  Diabetic? No  Interpreter Needed?: No      Activities of Daily Living    04/19/2022    4:20 PM  In your present state of health, do you have any difficulty performing the following activities:  Hearing? 0  Vision? 1  Comment Bifocals  Difficulty concentrating or making decisions? 0  Walking or climbing stairs? 0  Dressing or bathing? 0  Doing  errands, shopping? 0    Patient Care Team: Corwin LevinsJohn, [name], MD as PCP - General (Internal Medicine) Myrtie Neitheranis, Andreas BlowerHenry L III, MD as Consulting Physician (Gastroenterology) Comer, Belia Hemanobert W, MD as Consulting Physician (Infectious Diseases) Christia ReadingBates, Dwight, MD as Consulting Physician (Otolaryngology)  Indicate any recent Medical Services you may have received from other than [facility] providers in the past year (date may be approximate).     Assessment:  This is a routine wellness examination for [place].  Hearing/Vision screen No results found.  Dietary issues and exercise activities discussed: Current Exercise Habits: Structured exercise class, Type of exercise: strength training/weights;calisthenics, Time (Minutes): 45, Frequency (Times/Week): 3, Weekly Exercise (Minutes/Week): 135, Intensity: Moderate, Exercise limited by: Other - see comments   Depression Screen    04/19/2022    4:19 PM 02/23/2022    8:21 AM 04/17/2021    3:20 PM 03/07/2020    4:11 PM 03/07/2019    4:41 PM 03/07/2019    3:39 PM 12/12/2017    3:37 PM  PHQ 2/9 Scores  PHQ - 2 Score 0 0 0 0 0 0 0  PHQ- 9 Score 0 0         Fall Risk    04/19/2022    4:19 PM 02/23/2022    8:21 AM 04/17/2021    3:24 PM 03/21/2020   12:56 PM 03/07/2020    4:11 PM  Fall Risk   Falls in the past year? 0 0 0 0 0  Number falls in past yr: 0 0 0 0   Injury with Fall? 1 0 0 0   Risk for fall due to : No Fall Risks  No Fall Risks No Fall Risks   Follow up Education provided  Falls evaluation completed Falls evaluation completed;Education provided     FALL RISK PREVENTION PERTAINING TO THE HOME:  Any stairs in or around the home? No  If so, are there any without handrails? No  Home free of loose throw rugs in walkways, pet beds, electrical cords, etc? Yes  Adequate lighting in your home to reduce risk of falls? Yes   ASSISTIVE DEVICES UTILIZED TO PREVENT FALLS:  Life alert? No  Use of a cane, walker or w/c? No  Grab bars in the bathroom? Yes  Shower  chair or bench in shower? Yes  Elevated toilet seat or a handicapped toilet? Yes   TIMED UP AND GO:  Was the test performed? No .  Length of time to ambulate Unable to determine   Gait steady and fast without use of assistive device  Cognitive Function:        03/21/2020   12:58 PM  6CIT Screen  What Year? 0 points  What month? 0 points  What time? 0 points  Count back from 20 0 points  Months in reverse 0 points  Repeat phrase 0 points  Total Score 0 points    Immunizations Immunization History  Administered Date(s) Administered   Fluad Quad(high Dose 65+) 06/27/2019, 06/05/2021   Influenza Whole 08/15/2009   Influenza, High Dose Seasonal PF 07/11/2018   Influenza,inj,Quad PF,6+ Mos 08/27/2015   Moderna Sars-Covid-2 Vaccination 01/29/2021   PFIZER(Purple Top)SARS-COV-2 Vaccination 11/05/2019, 11/26/2019   Pneumococcal Conjugate-13 12/12/2017   Pneumococcal Polysaccharide-23 03/07/2019   Td 10/21/2009   Tdap 03/07/2020   Unspecified SARS-COV-2 Vaccination 10/15/2019, 11/08/2019   Zoster Recombinat (Shingrix) 03/06/2018, 06/05/2021   Zoster, Live 01/18/2016    TDAP status: Up to date  Flu Vaccine status: Up to date  Pneumococcal vaccine status: Up to date  Covid-19 vaccine status: Declined, Education has been provided regarding the importance of this vaccine but patient still declined. Advised may receive this vaccine at local pharmacy or Health Dept.or vaccine clinic. Aware to provide a copy of the vaccination record if obtained from local pharmacy or Health Dept. Verbalized acceptance and understanding.  Qualifies for Shingles Vaccine? Yes   Zostavax completed Yes   Shingrix Completed?: Yes  Screening Tests Health Maintenance  Topic Date Due   INFLUENZA VACCINE  04/13/2022   COLONOSCOPY (Pts 45-23yrs Insurance coverage will need to be confirmed)  06/12/2024   TETANUS/TDAP  03/07/2030   Pneumonia Vaccine 2+ Years old  Completed   Hepatitis C Screening   Completed   Zoster Vaccines- Shingrix  Completed   HPV VACCINES  Aged Out   COVID-19 Vaccine  Discontinued    Health Maintenance  Health Maintenance Due  Topic Date Due   INFLUENZA VACCINE  04/13/2022    Colorectal cancer screening: Type of screening: Colonoscopy. Completed 06/12/2014. Repeat every 10 years  Lung Cancer Screening: (Low Dose CT Chest recommended if Age 7-80 years, 30 pack-year currently smoking OR have quit w/in 15years.) does not qualify.   Lung Cancer Screening Referral: NA  Additional Screening:  Hepatitis C Screening: does qualify; Completed 06/05/2021  Vision Screening: Recommended annual ophthalmology exams for early detection of glaucoma and other disorders of the eye. Is the patient up to date with their annual eye exam?  Yes  Who is the provider or what is the name of the office in which the patient attends annual eye exams? [facility] If pt is not established with a provider, would they like to be referred to a provider to establish care? No .   Dental Screening: Recommended annual dental exams for proper oral hygiene  Community Resource Referral / Chronic Care Management: CRR required this visit?  No   CCM required this visit?  No      Plan:     I have personally reviewed and noted the following in the patient's chart:   Medical and social history Use of alcohol, tobacco or illicit drugs  Current medications and supplements including opioid prescriptions. Patient is not currently taking opioid prescriptions. Functional ability and status Nutritional status Physical activity Advanced directives List of other physicians Hospitalizations, surgeries, and ER visits in previous 12 months Vitals Screenings to include cognitive, depression, and falls Referrals and appointments  In addition, I have reviewed and discussed with patient certain preventive protocols, quality metrics, and best practice recommendations. A written personalized care  plan for preventive services as well as general preventive health recommendations were provided to patient.     Jeralene Peters, RN   04/19/2022   Nurse Notes: None face to face 30 minutes    Mr. Brannan ,  Thank you for taking time to come for your Medicare Wellness Visit. I appreciate your ongoing commitment to your health goals. Please review the following plan we discussed and let me know if I can assist you in the future.   These are the goals we discussed:  Goals       Patient Stated      I want to lose weight by increasing my physical activity and use portion control.      Patient Stated (pt-stated)      I would like to lose weight and get down to 185 pounds.      Patient Stated      I would like to get down to 180 pounds by being physically active and watching my food intake.        This is a list of the screening recommended for you and due dates:  Health Maintenance  Topic Date Due   Flu Shot  04/13/2022   Colon Cancer Screening  06/12/2024   Tetanus Vaccine  03/07/2030   Pneumonia Vaccine  Completed   Hepatitis C Screening: USPSTF  Recommendation to screen - Ages 35-79 yo.  Completed   Zoster (Shingles) Vaccine  Completed   HPV Vaccine  Aged Out   COVID-19 Vaccine  Discontinued

## 2022-04-20 ENCOUNTER — Telehealth: Payer: Self-pay | Admitting: Internal Medicine

## 2022-04-20 DIAGNOSIS — I10 Essential (primary) hypertension: Secondary | ICD-10-CM

## 2022-04-20 MED ORDER — AMLODIPINE BESYLATE 5 MG PO TABS
ORAL_TABLET | ORAL | 3 refills | Status: DC
Start: 2022-04-20 — End: 2023-03-14

## 2022-04-20 NOTE — Telephone Encounter (Signed)
Pt needs a refill of amLODipine (NORVASC) 5 MG tablet RX. Pt is out of his medication   Please send refill to [id number]  Phone:  [phone]  Fax:  [phone]

## 2022-04-20 NOTE — Telephone Encounter (Signed)
Refill sent to pharmacy; patient notified via voicemail

## 2022-04-29 NOTE — Progress Notes (Signed)
Subjective:   Christopher Mcfarland is a 76 y.o. male who presents for Medicare Annual/Subsequent preventive examination.  Review of Systems    Defer to PCP  I connected with  Christopher Mcfarland on 04/19/2022 by a audio enabled telemedicine application and verified that I am speaking with the correct person using two identifiers.  Patient Location: Home  Provider Location: Office/Clinic  I discussed the limitations of evaluation and management by telemedicine. The patient expressed understanding and agreed to proceed.        Objective:    There were no vitals filed for this visit. There is no height or weight on file to calculate BMI.  Advanced Directives: Patient does not have and declines to receive information on how to create one   Current Medications (verified) Outpatient Encounter Medications as of 04/19/2022  Medication Sig   allopurinol (ZYLOPRIM) 300 MG tablet TAKE 1 TABLET(300 MG) BY MOUTH DAILY   aspirin EC 81 MG tablet Take 1 tablet (81 mg total) by mouth daily. Swallow whole.   Cholecalciferol 50 MCG (2000 UT) TABS 1 tab by mouth once daily   Colchicine 0.6 MG CAPS TAKE 1 CAPSULE BY MOUTH DAILY AS NEEDED FOR GOUT   diazepam (VALIUM) 5 MG tablet 1 tab by mouth x 1 about 45 min prior to procedure   diclofenac Sodium (VOLTAREN) 1 % GEL Apply 4 g topically 4 (four) times daily.   guaiFENesin-codeine 100-10 MG/5ML syrup Take 10 mLs by mouth every 6 (six) hours as needed for cough.   ipratropium (ATROVENT) 0.06 % nasal spray Place 2 sprays into both nostrils 3 (three) times daily.   mupirocin ointment (BACTROBAN) 2 % APPLY WITHIN BOTH NASAL PASSAGES TWICE DAILY FOR 2 WEEKS   rosuvastatin (CRESTOR) 40 MG tablet Take 1 tablet (40 mg total) by mouth daily.   sildenafil (VIAGRA) 100 MG tablet TAKE ONE-HALF TO ONE TABLET BY MOUTH ONE TIME DAILY AS NEEDED FOR ERECTILE DYSFUNCTION   [DISCONTINUED] amLODipine (NORVASC) 5 MG tablet TAKE 1 TABLET(5 MG) BY MOUTH DAILY   No  facility-administered encounter medications on file as of 04/19/2022.    Allergies (verified) Patient has no known allergies.   History: Past Medical History:  Diagnosis Date   Essential hypertension    Gout 01/02/2020   Gout crystals on joint fluid April 2021   Hepatitis C 2003   Treated   Hyperlipidemia    Past Surgical History:  Procedure Laterality Date   CARPAL TUNNEL RELEASE     bilat 1999   HEMORRHOID SURGERY     1999/4 polyps removed rectum   TRIGGER FINGER RELEASE     right hand 1999   Family History  Problem Relation Age of Onset   CAD Brother 63   Kidney cancer Mother    Bone cancer Father    Colon cancer Neg Hx    Pancreatic cancer Neg Hx    Rectal cancer Neg Hx    Stomach cancer Neg Hx    Social History   Socioeconomic History   Marital status: Married    Spouse name: Not on file   Number of children: 4   Years of education: 12   Highest education level: Not on file  Occupational History   Occupation: Education administrator  Tobacco Use   Smoking status: Never   Smokeless tobacco: Never  Vaping Use   Vaping Use: Never used  Substance and Sexual Activity   Alcohol use: Not Currently    Alcohol/week: 2.0 - 3.0 standard drinks  of alcohol    Types: 2 - 3 Shots of liquor per week   Drug use: No    Comment: smoked marijuana "years ago"   Sexual activity: Yes  Other Topics Concern   Not on file  Social History Narrative   Fun: Play pool, yard work, gym, and Curator   Social Determinants of Health   Financial Resource Strain: Low Risk  (04/17/2021)   Overall Financial Resource Strain (CARDIA)    Difficulty of Paying Living Expenses: Not hard at all  Food Insecurity: No Food Insecurity (05/22/2020)   Hunger Vital Sign    Worried About Running Out of Food in the Last Year: Never true    Ran Out of Food in the Last Year: Never true  Transportation Needs: No Transportation Needs (04/17/2021)   PRAPARE - Administrator, Civil Service (Medical): No    Lack of  Transportation (Non-Medical): No  Physical Activity: Sufficiently Active (04/17/2021)   Exercise Vital Sign    Days of Exercise per Week: 5 days    Minutes of Exercise per Session: 30 min  Stress: No Stress Concern Present (04/17/2021)   Christopher Mcfarland of Occupational Health - Occupational Stress Questionnaire    Feeling of Stress : Not at all  Social Connections: Socially Integrated (04/17/2021)   Social Connection and Isolation Panel [NHANES]    Frequency of Communication with Friends and Family: More than three times a week    Frequency of Social Gatherings with Friends and Family: More than three times a week    Attends Religious Services: More than 4 times per year    Active Member of [employer] or Organizations: No    Attends Engineer, structural: More than 4 times per year    Marital Status: Living with partner    Tobacco Counseling Counseling given: Not Answered   Clinical Intake:  Pre-visit preparation completed: Yes  Pain : No/denies pain     Nutritional Status: BMI > 30  Obese Nutritional Risks: None Diabetes: No  How often do you need to have someone help you when you read instructions, pamphlets, or other written materials from your doctor or pharmacy?: 1 - Never  Diabetic? No  Interpreter Needed?: No    Activities of Daily Living    04/19/2022    4:20 PM  In your present state of health, do you have any difficulty performing the following activities:  Hearing? 0  Vision? 1  Comment Bifocals  Difficulty concentrating or making decisions? 0  Walking or climbing stairs? 0  Dressing or bathing? 0  Doing errands, shopping? 0    Patient Care Team: Christopher Levins, MD as PCP - General (Internal Medicine) Christopher Neither Andreas Blower, MD as Consulting Physician (Gastroenterology) Comer, Belia Heman, MD as Consulting Physician (Infectious Diseases) Christopher Reading, MD as Consulting Physician (Otolaryngology)  Indicate any recent Medical Services you may have received  from other than [facility] providers in the past year (date may be approximate).     Assessment:   This is a routine wellness examination for [name].  Hearing/Vision screen No results found.  Dietary issues and exercise activities discussed: Current Exercise Habits: Structured exercise class, Type of exercise: strength training/weights;calisthenics, Time (Minutes): 45, Frequency (Times/Week): 3, Weekly Exercise (Minutes/Week): 135, Intensity: Moderate, Exercise limited by: Other - see comments   Depression Screen    04/19/2022    4:19 PM 02/23/2022    8:21 AM 04/17/2021    3:20 PM 03/07/2020    4:11 PM  03/07/2019    4:41 PM 03/07/2019    3:39 PM 12/12/2017    3:37 PM  PHQ 2/9 Scores  PHQ - 2 Score 0 0 0 0 0 0 0  PHQ- 9 Score 0 0         Fall Risk    04/19/2022    4:19 PM 02/23/2022    8:21 AM 04/17/2021    3:24 PM 03/21/2020   12:56 PM 03/07/2020    4:11 PM  Fall Risk   Falls in the past year? 0 0 0 0 0  Number falls in past yr: 0 0 0 0   Injury with Fall? 1 0 0 0   Risk for fall due to : No Fall Risks  No Fall Risks No Fall Risks   Follow up Education provided  Falls evaluation completed Falls evaluation completed;Education provided     FALL RISK PREVENTION PERTAINING TO THE HOME:  Any stairs in or around the home? No  If so, are there any without handrails? No  Home free of loose throw rugs in walkways, pet beds, electrical cords, etc? Yes  Adequate lighting in your home to reduce risk of falls? Yes   ASSISTIVE DEVICES UTILIZED TO PREVENT FALLS:  Life alert? No  Use of a cane, walker or w/c? No  Grab bars in the bathroom? Yes  Shower chair or bench in shower? Yes  Elevated toilet seat or a handicapped toilet? Yes   TIMED UP AND GO:  Was the test performed? No .  Length of time to ambulate Unable to determine   Gait steady and fast without use of assistive device  Cognitive Function:  Patient is alert, oriented to time and place. Able to answer questions appropriately. Patient  was able to indicate what year this was, what month this was, what time it was, count backwards from 20-1, say the months of the year from December to January and repeat a phrase.     Immunizations Immunization History  Administered Date(s) Administered   Fluad Quad(high Dose 65+) 06/27/2019, 06/05/2021   Influenza Whole 08/15/2009   Influenza, High Dose Seasonal PF 07/11/2018   Influenza,inj,Quad PF,6+ Mos 08/27/2015   Moderna Sars-Covid-2 Vaccination 01/29/2021   PFIZER(Purple Top)SARS-COV-2 Vaccination 11/05/2019, 11/26/2019   Pneumococcal Conjugate-13 12/12/2017   Pneumococcal Polysaccharide-23 03/07/2019   Td 10/21/2009   Tdap 03/07/2020   Unspecified SARS-COV-2 Vaccination 10/15/2019, 11/08/2019   Zoster Recombinat (Shingrix) 03/06/2018, 06/05/2021   Zoster, Live 01/18/2016    TDAP status: Up to date  Flu Vaccine status: Up to date  Pneumococcal vaccine status: Up to date  Covid-19 vaccine status: Declined, Education has been provided regarding the importance of this vaccine but patient still declined. Advised may receive this vaccine at local pharmacy or Health Dept.or vaccine clinic. Aware to provide a copy of the vaccination record if obtained from local pharmacy or Health Dept. Verbalized acceptance and understanding.  Qualifies for Shingles Vaccine? Yes   Zostavax completed Yes   Shingrix Completed?: Yes  Screening Tests Health Maintenance  Topic Date Due   INFLUENZA VACCINE  04/13/2022   COLONOSCOPY (Pts 45-57yrs Insurance coverage will need to be confirmed)  06/12/2024   TETANUS/TDAP  03/07/2030   Pneumonia Vaccine 54+ Years old  Completed   Hepatitis C Screening  Completed   Zoster Vaccines- Shingrix  Completed   HPV VACCINES  Aged Out   COVID-19 Vaccine  Discontinued    Health Maintenance  Health Maintenance Due  Topic Date Due   INFLUENZA  VACCINE  04/13/2022    Colorectal cancer screening: Type of screening: Colonoscopy. Completed 06/12/2014.  Repeat every 10 years  Lung Cancer Screening: (Low Dose CT Chest recommended if Age 6-80 years, 30 pack-year currently smoking OR have quit w/in 15years.) does not qualify.   Lung Cancer Screening Referral: NA  Additional Screening:  Hepatitis C Screening: does qualify; Completed 06/05/2021  Vision Screening: Recommended annual ophthalmology exams for early detection of glaucoma and other disorders of the eye. Is the patient up to date with their annual eye exam?  Yes  Who is the provider or what is the name of the office in which the patient attends annual eye exams? [facility] If pt is not established with a provider, would they like to be referred to a provider to establish care? No .   Dental Screening: Recommended annual dental exams for proper oral hygiene  Community Resource Referral / Chronic Care Management: CRR required this visit?  No   CCM required this visit?  No      Plan:     I have personally reviewed and noted the following in the patient's chart:   Medical and social history Use of alcohol, tobacco or illicit drugs  Current medications and supplements including opioid prescriptions. Patient is not currently taking opioid prescriptions. Functional ability and status Nutritional status Physical activity Advanced directives List of other physicians Hospitalizations, surgeries, and ER visits in previous 12 months Vitals Screenings to include cognitive, depression, and falls Referrals and appointments  In addition, I have reviewed and discussed with patient certain preventive protocols, quality metrics, and best practice recommendations. A written personalized care plan for preventive services as well as general preventive health recommendations were provided to patient.     Jeralene Peters, RN  08/07/20232   Nurse Notes: None face to face 30 minutes    Mr. Malerba ,  Thank you for taking time to come for your Medicare Wellness Visit. I  appreciate your ongoing commitment to your health goals. Please review the following plan we discussed and let me know if I can assist you in the future.   These are the goals we discussed:  Goals       Patient Stated      I want to lose weight by increasing my physical activity and use portion control.      Patient Stated (pt-stated)      I would like to lose weight and get down to 185 pounds.      Patient Stated      I would like to get down to 180 pounds by being physically active and watching my food intake.        This is a list of the screening recommended for you and due dates:  Health Maintenance  Topic Date Due   Flu Shot  04/13/2022   Colon Cancer Screening  06/12/2024   Tetanus Vaccine  03/07/2030   Pneumonia Vaccine  Completed   Hepatitis C Screening: USPSTF Recommendation to screen - Ages 46-79 yo.  Completed   Zoster (Shingles) Vaccine  Completed   HPV Vaccine  Aged Out   COVID-19 Vaccine  Discontinued

## 2022-05-25 DIAGNOSIS — H2513 Age-related nuclear cataract, bilateral: Secondary | ICD-10-CM | POA: Diagnosis not present

## 2022-05-25 DIAGNOSIS — Z0100 Encounter for examination of eyes and vision without abnormal findings: Secondary | ICD-10-CM | POA: Diagnosis not present

## 2022-05-26 ENCOUNTER — Telehealth: Payer: Self-pay

## 2022-05-26 NOTE — Telephone Encounter (Signed)
Please try the OTC melatonin first, at up to even 10 mg qhs prn

## 2022-05-26 NOTE — Telephone Encounter (Signed)
Pt is requesting a sleep aid to help him rest. Its been an issue about a month.  Pt hasnt tried any OTC treatment for that.  Pharmacy: [id number] - [place], [place] - [address] AT [facility] OF [address]  LOV 02/23/22 ROV 08/25/22

## 2022-06-02 NOTE — Telephone Encounter (Signed)
Patient has tried melatonin for over a week - he would like to have something else sent in to his pharmacy

## 2022-06-03 MED ORDER — TRAZODONE HCL 50 MG PO TABS: 25.0000 mg | ORAL_TABLET | Freq: Every evening | ORAL | 1 refills | Status: DC | PRN

## 2022-06-03 NOTE — Telephone Encounter (Signed)
Ok to try trazodone 50 mg qhs prn - done erx

## 2022-06-03 NOTE — Telephone Encounter (Signed)
I assume melatonin is not helping, he is requesting an alternative.Please advise

## 2022-06-03 NOTE — Addendum Note (Signed)
Addended by: Biagio Borg on: 06/03/2022 07:56 PM   Modules accepted: Orders

## 2022-07-25 ENCOUNTER — Other Ambulatory Visit: Payer: Self-pay | Admitting: Internal Medicine

## 2022-07-29 ENCOUNTER — Telehealth: Payer: Self-pay | Admitting: Internal Medicine

## 2022-07-29 DIAGNOSIS — R058 Other specified cough: Secondary | ICD-10-CM

## 2022-07-29 MED ORDER — GUAIFENESIN-CODEINE 100-10 MG/5ML PO SOLN: 10.0000 mL | Freq: Four times a day (QID) | ORAL | 0 refills | Status: DC | PRN

## 2022-07-29 NOTE — Telephone Encounter (Signed)
Done erx 

## 2022-07-29 NOTE — Telephone Encounter (Signed)
Caller & Relationship to patient: PT  Call back number: [phone]  Date of last office visit: 02/23/2022  Date of next office visit: 08/25/2022  Medication(s) to be refilled:  guaiFENesin-codeine 100-10 MG/5ML syrup       Preferred Pharmacy:   [facility] #[id number] - [place], [place] - [address] AT [facility] OF [address]

## 2022-08-25 ENCOUNTER — Ambulatory Visit (INDEPENDENT_AMBULATORY_CARE_PROVIDER_SITE_OTHER): Payer: Medicare HMO | Admitting: Internal Medicine

## 2022-08-25 VITALS — BP 138/82 | HR 98 | Temp 98.2°F | Ht 68.0 in | Wt 217.0 lb

## 2022-08-25 DIAGNOSIS — E559 Vitamin D deficiency, unspecified: Secondary | ICD-10-CM | POA: Diagnosis not present

## 2022-08-25 DIAGNOSIS — R058 Other specified cough: Secondary | ICD-10-CM | POA: Diagnosis not present

## 2022-08-25 DIAGNOSIS — Z23 Encounter for immunization: Secondary | ICD-10-CM

## 2022-08-25 DIAGNOSIS — E538 Deficiency of other specified B group vitamins: Secondary | ICD-10-CM

## 2022-08-25 DIAGNOSIS — R739 Hyperglycemia, unspecified: Secondary | ICD-10-CM | POA: Diagnosis not present

## 2022-08-25 DIAGNOSIS — Z125 Encounter for screening for malignant neoplasm of prostate: Secondary | ICD-10-CM

## 2022-08-25 DIAGNOSIS — I10 Essential (primary) hypertension: Secondary | ICD-10-CM

## 2022-08-25 DIAGNOSIS — E782 Mixed hyperlipidemia: Secondary | ICD-10-CM | POA: Diagnosis not present

## 2022-08-25 DIAGNOSIS — N529 Male erectile dysfunction, unspecified: Secondary | ICD-10-CM | POA: Diagnosis not present

## 2022-08-25 DIAGNOSIS — R931 Abnormal findings on diagnostic imaging of heart and coronary circulation: Secondary | ICD-10-CM

## 2022-08-25 MED ORDER — ASPIRIN 81 MG PO TBEC: 81.0000 mg | DELAYED_RELEASE_TABLET | Freq: Every day | ORAL | 12 refills | Status: AC

## 2022-08-25 MED ORDER — TADALAFIL 20 MG PO TABS: 10.0000 mg | ORAL_TABLET | ORAL | 11 refills | Status: DC | PRN

## 2022-08-25 MED ORDER — GUAIFENESIN-CODEINE 100-10 MG/5ML PO SOLN: 10.0000 mL | Freq: Four times a day (QID) | ORAL | 0 refills | Status: DC | PRN

## 2022-08-25 MED ORDER — EZETIMIBE 10 MG PO TABS: 10.0000 mg | ORAL_TABLET | Freq: Every day | ORAL | 3 refills | Status: DC

## 2022-08-25 NOTE — Progress Notes (Signed)
Patient ID: Christopher Mcfarland, male   DOB: 04-09-1946, 76 y.o.   MRN: [id number]       Chief Complaint: follow up ED, elevated Card CT score, hld, htn       HPI:  Christopher Mcfarland is a 76 y.o. male here with several months worsening ED symptoms, and viagra no longer working as well.  Did also have recent abnormal cardiac CT score, trying to follow lower chol diet, Pt denies chest pain, increased sob or doe, wheezing, orthopnea, PND, increased LE swelling, palpitations, dizziness or syncope.   Pt denies polydipsia, polyuria, or new focal neuro s/s.    Pt denies fever, wt loss, night sweats, loss of appetite, or other constitutional symptoms   Due for flu shot  Wt Readings from Last 3 Encounters:  08/27/22 219 lb (99.3 kg)  08/25/22 217 lb (98.4 kg)  04/02/22 212 lb 3.2 oz (96.3 kg)   BP Readings from Last 3 Encounters:  08/27/22 136/88  08/25/22 138/82  04/02/22 138/80         Past Medical History:  Diagnosis Date   Essential hypertension    Gout 01/02/2020   Gout crystals on joint fluid April 2021   Hepatitis C 2003   Treated   Hyperlipidemia    Past Surgical History:  Procedure Laterality Date   CARPAL TUNNEL RELEASE     bilat 1999   HEMORRHOID SURGERY     1999/4 polyps removed rectum   TRIGGER FINGER RELEASE     right hand 1999    reports that he has never smoked. He has never used smokeless tobacco. He reports that he does not currently use alcohol after a past usage of about 2.0 - 3.0 standard drinks of alcohol per week. He reports that he does not use drugs. family history includes Bone cancer in his father; CAD (age of onset: 38) in his brother; Kidney cancer in his mother. No Known Allergies Current Outpatient Medications on File Prior to Visit  Medication Sig Dispense Refill   allopurinol (ZYLOPRIM) 300 MG tablet TAKE 1 TABLET(300 MG) BY MOUTH DAILY 90 tablet 3   amLODipine (NORVASC) 5 MG tablet TAKE 1 TABLET(5 MG) BY MOUTH DAILY 90 tablet 3   Cholecalciferol 50 MCG  (2000 UT) TABS 1 tab by mouth once daily 30 tablet 99   Colchicine 0.6 MG CAPS TAKE 1 CAPSULE BY MOUTH DAILY AS NEEDED FOR GOUT 90 capsule 1   diclofenac Sodium (VOLTAREN) 1 % GEL Apply 4 g topically 4 (four) times daily. 100 g 0   ipratropium (ATROVENT) 0.06 % nasal spray Place 2 sprays into both nostrils 3 (three) times daily.     mupirocin ointment (BACTROBAN) 2 % APPLY WITHIN BOTH NASAL PASSAGES TWICE DAILY FOR 2 WEEKS     rosuvastatin (CRESTOR) 40 MG tablet Take 1 tablet (40 mg total) by mouth daily. 90 tablet 3   sildenafil (VIAGRA) 100 MG tablet TAKE ONE-HALF TO ONE TABLET BY MOUTH ONE TIME DAILY AS NEEDED FOR ERECTILE DYSFUNCTION 5 tablet 10   traZODone (DESYREL) 50 MG tablet Take 0.5-1 tablets (25-50 mg total) by mouth at bedtime as needed for sleep. 90 tablet 1   No current facility-administered medications on file prior to visit.        ROS:  All others reviewed and negative.  Objective        PE:  BP 138/82 (BP Location: Right Arm, Patient Position: Sitting, Cuff Size: Large)   Pulse 98   Temp 98.2 F (  36.8 C) (Oral)   Ht 5\' 8"  (1.727 m)   Wt 217 lb (98.4 kg)   SpO2 96%   BMI 32.99 kg/m                 Constitutional: Pt appears in NAD               HENT: Head: NCAT.                Right Ear: External ear normal.                 Left Ear: External ear normal.                Eyes: . Pupils are equal, round, and reactive to light. Conjunctivae and EOM are normal               Nose: without d/c or deformity               Neck: Neck supple. Gross normal ROM               Cardiovascular: Normal rate and regular rhythm.                 Pulmonary/Chest: Effort normal and breath sounds without rales or wheezing.                Abd:  Soft, NT, ND, + BS, no organomegaly               Neurological: Pt is alert. At baseline orientation, motor grossly intact               Skin: Skin is warm. No rashes, no other new lesions, LE edema - none               Psychiatric: Pt behavior is  normal without agitation   Micro: none  Cardiac tracings I have personally interpreted today:  none  Pertinent Radiological findings (summarize): none   Lab Results  Component Value Date   WBC 4.3 10/19/2021   HGB 13.3 10/19/2021   HCT 40.9 10/19/2021   PLT 180.0 10/19/2021   GLUCOSE 124 (H) 10/19/2021   CHOL 136 10/19/2021   TRIG 206.0 (H) 10/19/2021   HDL 29.00 (L) 10/19/2021   LDLDIRECT 78.0 10/19/2021   LDLCALC 110 (H) 06/05/2021   ALT 23 10/19/2021   AST 20 10/19/2021   NA 140 10/19/2021   K 4.2 10/19/2021   CL 106 10/19/2021   CREATININE 1.13 10/19/2021   BUN 18 10/19/2021   CO2 28 10/19/2021   TSH 1.60 10/19/2021   PSA 0.57 10/19/2021   INR 1.17 08/20/2015   HGBA1C 6.1 10/19/2021   Assessment/Plan:  Christopher Mcfarland is a 76 y.o. Black or African American [2] male with  has a past medical history of Essential hypertension, Gout (01/02/2020), Hepatitis C (2003), and Hyperlipidemia.  Essential hypertension BP Readings from Last 3 Encounters:  08/27/22 136/88  08/25/22 138/82  04/02/22 138/80   Stable, pt to continue medical treatment norvasc 5 mg qd   Hyperglycemia Lab Results  Component Value Date   HGBA1C 6.1 10/19/2021   Stable, pt to continue current medical treatment  - diet, wt control, excercise   Hyperlipidemia Lab Results  Component Value Date   LDLCALC 110 (H) 06/05/2021   Uncontrolled, goal ldl < 70, pt to continue current statin crestor 40 mg qd, add zetia 10 mg qd   Elevated coronary artery calcium score Abnormal, for start asa  81 mg qd, cont lipid tx crestor/zetia, for cardiology referral  Erectile dysfunction With mild worsening, viagra no longer working well, for change to cialis 20 mg prn  Followup: No follow-ups on file.  Oliver Barre, MD 08/28/2022 6:53 AM [facility]

## 2022-08-25 NOTE — Patient Instructions (Addendum)
You had the flu shot today  Ok to change the viagra to cialis as needed  Please take all new medication as prescribed - the zetia 10 mg per day  Also take OTC Aspirin 81 mg  - 1 per day such as Ecotrin 81 mg   Please continue all other medications as before, and refills have been done if requested.  Please have the pharmacy call with any other refills you may need.  Please continue your efforts at being more active, low cholesterol diet, and weight control.  You are otherwise up to date with prevention measures today.  Please keep your appointments with your specialists as you may have planned  You will be contacted regarding the referral for: Cardiology  Please make an Appointment to return in 3 months, or sooner if needed, also with Lab Appointment for testing done 3-5 days before at the FIRST FLOOR Lab (so this is for TWO appointments - please see the scheduling desk as you leave)

## 2022-08-27 ENCOUNTER — Ambulatory Visit: Payer: Medicare HMO | Attending: Cardiovascular Disease | Admitting: Cardiovascular Disease

## 2022-08-27 ENCOUNTER — Encounter: Payer: Self-pay | Admitting: Cardiovascular Disease

## 2022-08-27 VITALS — BP 136/88 | HR 92 | Ht 68.0 in | Wt 219.0 lb

## 2022-08-27 DIAGNOSIS — I10 Essential (primary) hypertension: Secondary | ICD-10-CM

## 2022-08-27 DIAGNOSIS — I2510 Atherosclerotic heart disease of native coronary artery without angina pectoris: Secondary | ICD-10-CM | POA: Insufficient documentation

## 2022-08-27 DIAGNOSIS — I2584 Coronary atherosclerosis due to calcified coronary lesion: Secondary | ICD-10-CM

## 2022-08-27 DIAGNOSIS — E782 Mixed hyperlipidemia: Secondary | ICD-10-CM

## 2022-08-27 NOTE — Patient Instructions (Signed)
Medication Instructions:  Your physician recommends that you continue on your current medications as directed. Please refer to the Current Medication list given to you today.  *If you need a refill on your cardiac medications before your next appointment, please call your pharmacy*   Lab Work: NONE If you have labs (blood work) drawn today and your tests are completely normal, you will receive your results only by: MyChart Message (if you have MyChart) OR A paper copy in the mail If you have any lab test that is abnormal or we need to change your treatment, we will call you to review the results.   Testing/Procedures: NONE   Follow-Up: At [facility], you and your health needs are our priority.  As part of our continuing mission to provide you with exceptional heart care, we have created designated Provider Care Teams.  These Care Teams include your primary Cardiologist (physician) and Advanced Practice Providers (APPs -  Physician Assistants and Nurse Practitioners) who all work together to provide you with the care you need, when you need it.  We recommend signing up for the patient portal called "MyChart".  Sign up information is provided on this After Visit Summary.  MyChart is used to connect with patients for Virtual Visits (Telemedicine).  Patients are able to view lab/test results, encounter notes, upcoming appointments, etc.  Non-urgent messages can be sent to your provider as well.   To learn more about what you can do with MyChart, go to [email or web address].    Your next appointment:   3 month(s)  The format for your next appointment:   In Person  Provider:   Michelle Swinyer, NP      Important Information About Sugar       

## 2022-08-27 NOTE — Progress Notes (Signed)
Cardiology Office Note:    Date:  08/27/2022   ID:  Christopher Mcfarland, DOB [birth year], MRN [id number]  PCP:  Corwin Levins, MD   Christopher Mcfarland:  Tennille Montelongo   Referring MD: Corwin Levins, MD   Chief Complaint  Patient presents with   coronary calcium   Hyperlipidemia   Hypertension         History of Present Illness:    Christopher Mcfarland is a 76 y.o. male with a hx of HLD,  HTN  Has had HLD for years . Has been on rosuvastatin 10 mg a day for many years.  When his coronary calcium score returned elevated his rosuvastatin was increased to 40 mg a day and Zetia 10 mg a day was added.  No CP or dyspnea  Exercises regularly , Silver Sneakers 3-4 days a week  Has had a viral URI which has slowed his work outs some   He has not had any episodes of chest pain, chest pressure or fullness with his workouts.  No syncope   Fam Hx:  brother has some cardiac issues  Coronary artery calcium score shows a total score of 5942 which places him in the 99th percentile for age and gender matched controls.  His LDL goal is 55.    Tries to follow a low cholesterol diet   Las LDL ws 110 ,  has a follow up lipid profile in March       Past Medical History:  Diagnosis Date   Essential hypertension    Gout 01/02/2020   Gout crystals on joint fluid April 2021   Hepatitis C 2003   Treated   Hyperlipidemia     Past Surgical History:  Procedure Laterality Date   CARPAL TUNNEL RELEASE     bilat 1999   HEMORRHOID SURGERY     1999/4 polyps removed rectum   TRIGGER FINGER RELEASE     right hand 1999    Current Medications: Current Meds  Medication Sig   allopurinol (ZYLOPRIM) 300 MG tablet TAKE 1 TABLET(300 MG) BY MOUTH DAILY   amLODipine (NORVASC) 5 MG tablet TAKE 1 TABLET(5 MG) BY MOUTH DAILY   aspirin EC 81 MG tablet Take 1 tablet (81 mg total) by mouth daily. Swallow whole.   Cholecalciferol 50 MCG (2000 UT) TABS 1 tab by mouth once daily    Colchicine 0.6 MG CAPS TAKE 1 CAPSULE BY MOUTH DAILY AS NEEDED FOR GOUT   diclofenac Sodium (VOLTAREN) 1 % GEL Apply 4 g topically 4 (four) times daily.   ezetimibe (ZETIA) 10 MG tablet Take 1 tablet (10 mg total) by mouth daily.   guaiFENesin-codeine 100-10 MG/5ML syrup Take 10 mLs by mouth every 6 (six) hours as needed for cough.   ipratropium (ATROVENT) 0.06 % nasal spray Place 2 sprays into both nostrils 3 (three) times daily.   mupirocin ointment (BACTROBAN) 2 % APPLY WITHIN BOTH NASAL PASSAGES TWICE DAILY FOR 2 WEEKS   rosuvastatin (CRESTOR) 40 MG tablet Take 1 tablet (40 mg total) by mouth daily.   sildenafil (VIAGRA) 100 MG tablet TAKE ONE-HALF TO ONE TABLET BY MOUTH ONE TIME DAILY AS NEEDED FOR ERECTILE DYSFUNCTION   tadalafil (CIALIS) 20 MG tablet Take 0.5-1 tablets (10-20 mg total) by mouth every other day as needed for erectile dysfunction.   traZODone (DESYREL) 50 MG tablet Take 0.5-1 tablets (25-50 mg total) by mouth at bedtime as needed for sleep.     Allergies:   Patient  has no known allergies.   Social History   Socioeconomic History   Marital status: Married    Spouse name: Not on file   Number of children: 4   Years of education: 12   Highest education level: Not on file  Occupational History   Occupation: Education administrator  Tobacco Use   Smoking status: Never   Smokeless tobacco: Never  Vaping Use   Vaping Use: Never used  Substance and Sexual Activity   Alcohol use: Not Currently    Alcohol/week: 2.0 - 3.0 standard drinks of alcohol    Types: 2 - 3 Shots of liquor per week   Drug use: No    Comment: smoked marijuana "years ago"   Sexual activity: Yes  Other Topics Concern   Not on file  Social History Narrative   Fun: Play pool, yard work, gym, and Curator   Social Determinants of Health   Financial Resource Strain: Low Risk  (04/17/2021)   Overall Financial Resource Strain (CARDIA)    Difficulty of Paying Living Expenses: Not hard at all  Food Insecurity: No Food  Insecurity (05/22/2020)   Hunger Vital Sign    Worried About Running Out of Food in the Last Year: Never true    Ran Out of Food in the Last Year: Never true  Transportation Needs: No Transportation Needs (04/17/2021)   PRAPARE - Administrator, Civil Service (Medical): No    Lack of Transportation (Non-Medical): No  Physical Activity: Sufficiently Active (04/17/2021)   Exercise Vital Sign    Days of Exercise per Week: 5 days    Minutes of Exercise per Session: 30 min  Stress: No Stress Concern Present (04/17/2021)   [employer] of Occupational Health - Occupational Stress Questionnaire    Feeling of Stress : Not at all  Social Connections: Socially Integrated (04/17/2021)   Social Connection and Isolation Panel [NHANES]    Frequency of Communication with Friends and Family: More than three times a week    Frequency of Social Gatherings with Friends and Family: More than three times a week    Attends Religious Services: More than 4 times per year    Active Member of [employer] or Organizations: No    Attends Engineer, structural: More than 4 times per year    Marital Status: Living with partner     Family History: The patient's family history includes Bone cancer in his father; CAD (age of onset: 29) in his brother; Kidney cancer in his mother. There is no history of Colon cancer, Pancreatic cancer, Rectal cancer, or Stomach cancer.  ROS:   Please see the history of present illness.     All other systems reviewed and are negative.  EKGs/Labs/Other Studies Reviewed:    The following studies were reviewed today:   EKG:   August 27, 2022: Normal sinus rhythm at 92.  Moderate voltage criteria for LVH.  Borderline ECG.  Recent Labs: 10/19/2021: ALT 23; BUN 18; Creatinine, Ser 1.13; Hemoglobin 13.3; Platelets 180.0; Potassium 4.2; Sodium 140; TSH 1.60  Recent Lipid Panel    Component Value Date/Time   CHOL 136 10/19/2021 1100   TRIG 206.0 (H) 10/19/2021 1100   HDL  29.00 (L) 10/19/2021 3785   CHOLHDL 5 10/19/2021 1100   VLDL 41.2 (H) 10/19/2021 1100   LDLCALC 110 (H) 06/05/2021 1700   LDLCALC 91 03/07/2020 1634   LDLDIRECT 78.0 10/19/2021 1100     Risk Assessment/Calculations:  Physical Exam:    VS:  BP 136/88   Pulse 92   Ht 5\' 8"  (1.727 m)   Wt 219 lb (99.3 kg)   SpO2 97%   BMI 33.30 kg/m     Wt Readings from Last 3 Encounters:  08/27/22 219 lb (99.3 kg)  08/25/22 217 lb (98.4 kg)  04/02/22 212 lb 3.2 oz (96.3 kg)     GEN:  Well nourished, well developed in no acute distress HEENT: Normal NECK: No JVD; No carotid bruits LYMPHATICS: No lymphadenopathy CARDIAC: RRR, no murmurs, rubs, gallops RESPIRATORY:  Clear to auscultation without rales, wheezing or rhonchi  ABDOMEN: Soft, non-tender, non-distended MUSCULOSKELETAL:  No edema; No deformity  SKIN: Warm and dry NEUROLOGIC:  Alert and oriented x 3 PSYCHIATRIC:  Normal affect   ASSESSMENT:    1. Essential hypertension   2. Mixed hyperlipidemia   3. Coronary artery calcification    PLAN:       Coronary artery calcifications: Partick  presents with a coronary calcium score of 5942.  He exercises daily and does not have any chest pain or dyspnea I've asked him to watch closely for any symptoms  He is on max dose rousvastatin and zetia  Would have a low threshold to refer for cardiac PET scan or perhaps cardiac cath if he develops any symptoms   Needs to watch his diet   Hypertension: Blood pressure is well-controlled.           Medication Adjustments/Labs and Tests Ordered: Current medicines are reviewed at length with the patient today.  Concerns regarding medicines are outlined above.  Orders Placed This Encounter  Procedures   EKG 12-Lead   No orders of the defined types were placed in this encounter.   Patient Instructions  Medication Instructions:  Your physician recommends that you continue on your current medications as directed.  Please refer to the Current Medication list given to you today.  *If you need a refill on your cardiac medications before your next appointment, please call your pharmacy*   Lab Work: NONE If you have labs (blood work) drawn today and your tests are completely normal, you will receive your results only by: MyChart Message (if you have MyChart) OR A paper copy in the mail If you have any lab test that is abnormal or we need to change your treatment, we will call you to review the results.   Testing/Procedures: NONE   Follow-Up: At [facility], you and your health needs are our priority.  As part of our continuing mission to provide you with exceptional heart care, we have created designated Provider Care Teams.  These Care Teams include your primary Mcfarland (physician) and Advanced Practice Providers (APPs -  Physician Assistants and Nurse Practitioners) who all work together to provide you with the care you need, when you need it.  We recommend signing up for the patient portal called "MyChart".  Sign up information is provided on this After Visit Summary.  MyChart is used to connect with patients for Virtual Visits (Telemedicine).  Patients are able to view lab/test results, encounter notes, upcoming appointments, etc.  Non-urgent messages can be sent to your provider as well.   To learn more about what you can do with MyChart, go to [facility].    Your next appointment:   3 month(s)  The format for your next appointment:   In Person  Provider:   [email or web address], NP    Important Information About Sugar  Signed, Kristeen MissPhilip Norita Meigs, MD  08/27/2022 11:32 AM    [facility]

## 2022-08-28 ENCOUNTER — Encounter: Payer: Self-pay | Admitting: Internal Medicine

## 2022-08-28 DIAGNOSIS — R931 Abnormal findings on diagnostic imaging of heart and coronary circulation: Secondary | ICD-10-CM | POA: Insufficient documentation

## 2022-08-28 NOTE — Assessment & Plan Note (Signed)
Abnormal, for start asa 81 mg qd, cont lipid tx crestor/zetia, for cardiology referral

## 2022-08-28 NOTE — Assessment & Plan Note (Signed)
Lab Results  Component Value Date   LDLCALC 110 (H) 06/05/2021   Uncontrolled, goal ldl < 70, pt to continue current statin crestor 40 mg qd, add zetia 10 mg qd

## 2022-08-28 NOTE — Assessment & Plan Note (Signed)
BP Readings from Last 3 Encounters:  08/27/22 136/88  08/25/22 138/82  04/02/22 138/80   Stable, pt to continue medical treatment norvasc 5 mg qd

## 2022-08-28 NOTE — Assessment & Plan Note (Signed)
With mild worsening, viagra no longer working well, for change to cialis 20 mg prn

## 2022-08-28 NOTE — Assessment & Plan Note (Signed)
Lab Results  Component Value Date   HGBA1C 6.1 10/19/2021   Stable, pt to continue current medical treatment  - diet, wt control, excercise

## 2022-09-01 ENCOUNTER — Other Ambulatory Visit: Payer: Self-pay | Admitting: Internal Medicine

## 2022-09-01 DIAGNOSIS — R058 Other specified cough: Secondary | ICD-10-CM

## 2022-09-01 MED ORDER — GUAIFENESIN-CODEINE 100-10 MG/5ML PO SOLN: 10.0000 mL | Freq: Four times a day (QID) | ORAL | 0 refills | Status: DC | PRN

## 2022-09-27 ENCOUNTER — Other Ambulatory Visit: Payer: Self-pay

## 2022-09-27 MED ORDER — ALLOPURINOL 300 MG PO TABS: ORAL_TABLET | ORAL | 2 refills | Status: DC

## 2022-11-24 ENCOUNTER — Ambulatory Visit (INDEPENDENT_AMBULATORY_CARE_PROVIDER_SITE_OTHER): Payer: Medicare HMO | Admitting: Internal Medicine

## 2022-11-24 ENCOUNTER — Encounter: Payer: Self-pay | Admitting: Internal Medicine

## 2022-11-24 VITALS — BP 134/86 | HR 85 | Temp 97.9°F | Ht 68.0 in | Wt 205.5 lb

## 2022-11-24 DIAGNOSIS — E782 Mixed hyperlipidemia: Secondary | ICD-10-CM

## 2022-11-24 DIAGNOSIS — I10 Essential (primary) hypertension: Secondary | ICD-10-CM

## 2022-11-24 DIAGNOSIS — R739 Hyperglycemia, unspecified: Secondary | ICD-10-CM

## 2022-11-24 DIAGNOSIS — Z0001 Encounter for general adult medical examination with abnormal findings: Secondary | ICD-10-CM | POA: Diagnosis not present

## 2022-11-24 DIAGNOSIS — Z125 Encounter for screening for malignant neoplasm of prostate: Secondary | ICD-10-CM

## 2022-11-24 DIAGNOSIS — E559 Vitamin D deficiency, unspecified: Secondary | ICD-10-CM | POA: Diagnosis not present

## 2022-11-24 DIAGNOSIS — E538 Deficiency of other specified B group vitamins: Secondary | ICD-10-CM | POA: Diagnosis not present

## 2022-11-24 LAB — LIPID PANEL
Cholesterol: 107 mg/dL (ref 0–200)
HDL: 37.4 mg/dL — ABNORMAL LOW (ref >39.00)
LDL Cholesterol: 57 mg/dL (ref 0–99)
NonHDL: 69.43
Total CHOL/HDL Ratio: 3
Triglycerides: 61 mg/dL (ref 0.0–149.0)
VLDL: 12.2 mg/dL (ref 0.0–40.0)

## 2022-11-24 LAB — CBC WITH DIFFERENTIAL/PLATELET
Basophils Absolute: 0.1 10*3/uL (ref 0.0–0.1)
Basophils Relative: 1.2 % (ref 0.0–3.0)
Eosinophils Absolute: 0.1 10*3/uL (ref 0.0–0.7)
Eosinophils Relative: 3.1 % (ref 0.0–5.0)
HCT: 41.1 % (ref 39.0–52.0)
Hemoglobin: 13.6 g/dL (ref 13.0–17.0)
Lymphocytes Relative: 54.6 % — ABNORMAL HIGH (ref 12.0–46.0)
Lymphs Abs: 2.4 10*3/uL (ref 0.7–4.0)
MCHC: 33.1 g/dL (ref 30.0–36.0)
MCV: 88 fl (ref 78.0–100.0)
Monocytes Absolute: 0.6 10*3/uL (ref 0.1–1.0)
Monocytes Relative: 12.8 % — ABNORMAL HIGH (ref 3.0–12.0)
Neutro Abs: 1.3 10*3/uL — ABNORMAL LOW (ref 1.4–7.7)
Neutrophils Relative %: 28.3 % — ABNORMAL LOW (ref 43.0–77.0)
Platelets: 181 10*3/uL (ref 150.0–400.0)
RBC: 4.67 Mil/uL (ref 4.22–5.81)
RDW: 14.7 % (ref 11.5–15.5)
WBC: 4.4 10*3/uL (ref 4.0–10.5)

## 2022-11-24 LAB — HEPATIC FUNCTION PANEL
ALT: 43 U/L (ref 0–53)
AST: 39 U/L — ABNORMAL HIGH (ref 0–37)
Albumin: 4.4 g/dL (ref 3.5–5.2)
Alkaline Phosphatase: 64 U/L (ref 39–117)
Bilirubin, Direct: 0.2 mg/dL (ref 0.0–0.3)
Total Bilirubin: 0.7 mg/dL (ref 0.2–1.2)
Total Protein: 7.5 g/dL (ref 6.0–8.3)

## 2022-11-24 LAB — HEMOGLOBIN A1C: Hgb A1c MFr Bld: 6.2 % (ref 4.6–6.5)

## 2022-11-24 LAB — BASIC METABOLIC PANEL
BUN: 24 mg/dL — ABNORMAL HIGH (ref 6–23)
CO2: 30 mEq/L (ref 19–32)
Calcium: 9.8 mg/dL (ref 8.4–10.5)
Chloride: 104 mEq/L (ref 96–112)
Creatinine, Ser: 1.2 mg/dL (ref 0.40–1.50)
GFR: 59.38 mL/min — ABNORMAL LOW (ref >60.00)
Glucose, Bld: 100 mg/dL — ABNORMAL HIGH (ref 70–99)
Potassium: 4.8 mEq/L (ref 3.5–5.1)
Sodium: 140 mEq/L (ref 135–145)

## 2022-11-24 LAB — URINALYSIS, ROUTINE W REFLEX MICROSCOPIC
Bilirubin Urine: NEGATIVE
Hgb urine dipstick: NEGATIVE
Ketones, ur: NEGATIVE
Leukocytes,Ua: NEGATIVE
Nitrite: NEGATIVE
RBC / HPF: NONE SEEN (ref 0–?)
Specific Gravity, Urine: 1.025 (ref 1.000–1.030)
Total Protein, Urine: NEGATIVE
Urine Glucose: NEGATIVE
Urobilinogen, UA: 0.2 (ref 0.0–1.0)
pH: 5.5 (ref 5.0–8.0)

## 2022-11-24 LAB — PSA: PSA: 0.85 ng/mL (ref 0.10–4.00)

## 2022-11-24 LAB — VITAMIN B12: Vitamin B-12: 403 pg/mL (ref 211–911)

## 2022-11-24 LAB — TSH: TSH: 1.43 u[IU]/mL (ref 0.35–5.50)

## 2022-11-24 LAB — VITAMIN D 25 HYDROXY (VIT D DEFICIENCY, FRACTURES): VITD: 33.53 ng/mL (ref 30.00–100.00)

## 2022-11-24 NOTE — Patient Instructions (Signed)

## 2022-11-24 NOTE — Progress Notes (Signed)
Patient ID: Christopher Mcfarland, male   DOB: [birth year], 77 y.o.   MRN: [id number]         Chief Complaint:: wellness exam and hyeprglycemia, hld, low vit d, htn       HPI:  Christopher Mcfarland is a 77 y.o. male here for wellness exam; up to date                Also lost 19 lbs with better diet and activity.  Pt denies chest pain, increased sob or doe, wheezing, orthopnea, PND, increased LE swelling, palpitations, dizziness or syncope.   Pt denies polydipsia, polyuria, or new focal neuro s/s.    Pt denies fever, wt loss, night sweats, loss of appetite, or other constitutional symptoms     Wt Readings from Last 3 Encounters:  11/26/22 205 lb (93 kg)  11/24/22 205 lb 8 oz (93.2 kg)  08/27/22 219 lb (99.3 kg)   BP Readings from Last 3 Encounters:  11/26/22 118/72  11/24/22 134/86  08/27/22 136/88   Immunization History  Administered Date(s) Administered   Fluad Quad(high Dose 65+) 06/27/2019, 06/05/2021, 08/25/2022   Influenza Whole 08/15/2009   Influenza, High Dose Seasonal PF 07/11/2018   Influenza,inj,Quad PF,6+ Mos 08/27/2015   Moderna Sars-Covid-2 Vaccination 01/29/2021   PFIZER(Purple Top)SARS-COV-2 Vaccination 11/05/2019, 11/26/2019   Pneumococcal Conjugate-13 12/12/2017   Pneumococcal Polysaccharide-23 03/07/2019   Td 10/21/2009   Tdap 03/07/2020   Unspecified SARS-COV-2 Vaccination 10/15/2019, 11/08/2019   Zoster Recombinat (Shingrix) 03/06/2018, 06/05/2021   Zoster, Live 01/18/2016  There are no preventive care reminders to display for this patient.    Past Medical History:  Diagnosis Date   Essential hypertension    Gout 01/02/2020   Gout crystals on joint fluid April 2021   Hepatitis C 2003   Treated   Hyperlipidemia    Past Surgical History:  Procedure Laterality Date   CARPAL TUNNEL RELEASE     bilat 1999   HEMORRHOID SURGERY     1999/4 polyps removed rectum   TRIGGER FINGER RELEASE     right hand 1999    reports that he has never smoked. He has never used  smokeless tobacco. He reports that he does not currently use alcohol after a past usage of about 2.0 - 3.0 standard drinks of alcohol per week. He reports that he does not use drugs. family history includes Bone cancer in his father; CAD (age of onset: 63) in his brother; Kidney cancer in his mother. No Known Allergies Current Outpatient Medications on File Prior to Visit  Medication Sig Dispense Refill   allopurinol (ZYLOPRIM) 300 MG tablet TAKE 1 TABLET(300 MG) BY MOUTH DAILY 90 tablet 2   amLODipine (NORVASC) 5 MG tablet TAKE 1 TABLET(5 MG) BY MOUTH DAILY 90 tablet 3   aspirin EC 81 MG tablet Take 1 tablet (81 mg total) by mouth daily. Swallow whole. 30 tablet 12   Cholecalciferol 50 MCG (2000 UT) TABS 1 tab by mouth once daily 30 tablet 99   Colchicine 0.6 MG CAPS TAKE 1 CAPSULE BY MOUTH DAILY AS NEEDED FOR GOUT 90 capsule 1   diclofenac Sodium (VOLTAREN) 1 % GEL Apply 4 g topically 4 (four) times daily. 100 g 0   ezetimibe (ZETIA) 10 MG tablet Take 1 tablet (10 mg total) by mouth daily. 90 tablet 3   guaiFENesin-codeine 100-10 MG/5ML syrup Take 10 mLs by mouth every 6 (six) hours as needed for cough. 118 mL 0   ipratropium (ATROVENT) 0.06 % nasal  spray Place 2 sprays into both nostrils 3 (three) times daily.     mupirocin ointment (BACTROBAN) 2 % APPLY WITHIN BOTH NASAL PASSAGES TWICE DAILY FOR 2 WEEKS     rosuvastatin (CRESTOR) 40 MG tablet Take 1 tablet (40 mg total) by mouth daily. 90 tablet 3   sildenafil (VIAGRA) 100 MG tablet TAKE ONE-HALF TO ONE TABLET BY MOUTH ONE TIME DAILY AS NEEDED FOR ERECTILE DYSFUNCTION 5 tablet 10   tadalafil (CIALIS) 20 MG tablet Take 0.5-1 tablets (10-20 mg total) by mouth every other day as needed for erectile dysfunction. 10 tablet 11   traZODone (DESYREL) 50 MG tablet Take 0.5-1 tablets (25-50 mg total) by mouth at bedtime as needed for sleep. 90 tablet 1   No current facility-administered medications on file prior to visit.        ROS:  All others  reviewed and negative.  Objective        PE:  BP 134/86   Pulse 85   Temp 97.9 F (36.6 C) (Oral)   Ht 5\' 8"  (1.727 m)   Wt 205 lb 8 oz (93.2 kg)   SpO2 97%   BMI 31.25 kg/m                 Constitutional: Pt appears in NAD               HENT: Head: NCAT.                Right Ear: External ear normal.                 Left Ear: External ear normal.                Eyes: . Pupils are equal, round, and reactive to light. Conjunctivae and EOM are normal               Nose: without d/c or deformity               Neck: Neck supple. Gross normal ROM               Cardiovascular: Normal rate and regular rhythm.                 Pulmonary/Chest: Effort normal and breath sounds without rales or wheezing.                Abd:  Soft, NT, ND, + BS, no organomegaly               Neurological: Pt is alert. At baseline orientation, motor grossly intact               Skin: Skin is warm. No rashes, no other new lesions, LE edema - none               Psychiatric: Pt behavior is normal without agitation   Micro: none  Cardiac tracings I have personally interpreted today:  none  Pertinent Radiological findings (summarize): none   Lab Results  Component Value Date   WBC 4.4 11/24/2022   HGB 13.6 11/24/2022   HCT 41.1 11/24/2022   PLT 181.0 11/24/2022   GLUCOSE 100 (H) 11/24/2022   CHOL 107 11/24/2022   TRIG 61.0 11/24/2022   HDL 37.40 (L) 11/24/2022   LDLDIRECT 78.0 10/19/2021   LDLCALC 57 11/24/2022   ALT 43 11/24/2022   AST 39 (H) 11/24/2022   NA 140 11/24/2022   K 4.8 11/24/2022   CL 104 11/24/2022  CREATININE 1.20 11/24/2022   BUN 24 (H) 11/24/2022   CO2 30 11/24/2022   TSH 1.43 11/24/2022   PSA 0.85 11/24/2022   INR 1.17 08/20/2015   HGBA1C 6.2 11/24/2022   Assessment/Plan:  Christopher Mcfarland is a 77 y.o. Black or African American [2] male with  has a past medical history of Essential hypertension, Gout (01/02/2020), Hepatitis C (2003), and Hyperlipidemia.  Encounter for  well adult exam with abnormal findings Age and sex appropriate education and counseling updated with regular exercise and diet Referrals for preventative services - none needed Immunizations addressed - none needed Smoking counseling  - none needed Evidence for depression or other mood disorder - none significant Most recent labs reviewed. I have personally reviewed and have noted: 1) the patient's medical and social history 2) The patient's current medications and supplements 3) The patient's height, weight, and BMI have been recorded in the chart   Essential hypertension BP Readings from Last 3 Encounters:  11/26/22 118/72  11/24/22 134/86  08/27/22 136/88   Stable, pt to continue medical treatment norvasc 5 mg qd   Hyperglycemia Lab Results  Component Value Date   HGBA1C 6.2 11/24/2022   Stable, pt to continue current medical treatment  - diet, wt control   Hyperlipidemia Lab Results  Component Value Date   LDLCALC 57 11/24/2022   Stable, pt to continue current statin zetia 10 mg, crestor 40 mg qd   Vitamin D deficiency Last vitamin D Lab Results  Component Value Date   VD25OH 33.53 11/24/2022   Low, to start oral replacement  Followup: Return in about 1 year (around 11/24/2023).  Cathlean Cower, MD 11/26/2022 9:35 PM [facility]

## 2022-11-25 NOTE — Progress Notes (Signed)
Cardiology Office Note:    Date:  11/26/2022   ID:  Christopher Mcfarland, DOB August 26, 1946, MRN [id number]  PCP:  Biagio Borg, MD   [facility] Providers Cardiologist:  Mertie Moores, MD     Referring MD: Biagio Borg, MD   Chief Complaint: follow-up elevated CAC  History of Present Illness:    Christopher Mcfarland is a very pleasant 77 y.o. male with a hx of elevated coronary calcium score, hypertension and hyperlipidemia.  History of hyperlipidemia for several years.  Was on rosuvastatin 10 mg daily for many years.  CT calcium score 03/19/2022 of 5942, 99th percentile for age/race/sex.  Rosuvastatin was increased to 40 mg daily and ezetimibe 10 mg daily was added. Seen by cardiology in the past.   Referred back to our group and seen by Dr. Acie Fredrickson on 08/27/2022.  Reported he exercises regularly, Silver sneakers 3-4 times per week.  No chest pain, syncope, or other concerning cardiac symptoms.  He reported he tries to follow a low-cholesterol diet.  Recent lab results reveail LDL 57, HDL 37, triglycerides 61 on 11/24/22.   Today, he is here alone for follow-up. He reports he is working out consistently at [employer] along with all of his yard work, push mows his lawn with no symptoms of chest pain, shortness of breath, palpitations, presyncope, syncope.  Sleeps well.  No orthopnea, or PND.  He feels very well and is anxious to discuss cholesterol test results.  Reports he has changed his diet and is avoiding processed foods, potatoes, bread, and is eating more fruits and vegetables. Has lost 14 lbs.   Past Medical History:  Diagnosis Date   Essential hypertension    Gout 01/02/2020   Gout crystals on joint fluid April 2021   Hepatitis C 2003   Treated   Hyperlipidemia     Past Surgical History:  Procedure Laterality Date   CARPAL TUNNEL RELEASE     bilat 1999   HEMORRHOID SURGERY     1999/4 polyps removed rectum   TRIGGER FINGER RELEASE     right hand 1999    Current  Medications: Current Meds  Medication Sig   allopurinol (ZYLOPRIM) 300 MG tablet TAKE 1 TABLET(300 MG) BY MOUTH DAILY   amLODipine (NORVASC) 5 MG tablet TAKE 1 TABLET(5 MG) BY MOUTH DAILY   aspirin EC 81 MG tablet Take 1 tablet (81 mg total) by mouth daily. Swallow whole.   Cholecalciferol 50 MCG (2000 UT) TABS 1 tab by mouth once daily   Colchicine 0.6 MG CAPS TAKE 1 CAPSULE BY MOUTH DAILY AS NEEDED FOR GOUT   diclofenac Sodium (VOLTAREN) 1 % GEL Apply 4 g topically 4 (four) times daily.   ezetimibe (ZETIA) 10 MG tablet Take 1 tablet (10 mg total) by mouth daily.   guaiFENesin-codeine 100-10 MG/5ML syrup Take 10 mLs by mouth every 6 (six) hours as needed for cough.   ipratropium (ATROVENT) 0.06 % nasal spray Place 2 sprays into both nostrils 3 (three) times daily.   mupirocin ointment (BACTROBAN) 2 % APPLY WITHIN BOTH NASAL PASSAGES TWICE DAILY FOR 2 WEEKS   rosuvastatin (CRESTOR) 40 MG tablet Take 1 tablet (40 mg total) by mouth daily.   sildenafil (VIAGRA) 100 MG tablet TAKE ONE-HALF TO ONE TABLET BY MOUTH ONE TIME DAILY AS NEEDED FOR ERECTILE DYSFUNCTION   tadalafil (CIALIS) 20 MG tablet Take 0.5-1 tablets (10-20 mg total) by mouth every other day as needed for erectile dysfunction.   traZODone (DESYREL)  50 MG tablet Take 0.5-1 tablets (25-50 mg total) by mouth at bedtime as needed for sleep.     Allergies:   Patient has no known allergies.   Social History   Socioeconomic History   Marital status: Married    Spouse name: Not on file   Number of children: 4   Years of education: 12   Highest education level: Not on file  Occupational History   Occupation: Curator  Tobacco Use   Smoking status: Never   Smokeless tobacco: Never  Vaping Use   Vaping Use: Never used  Substance and Sexual Activity   Alcohol use: Not Currently    Alcohol/week: 2.0 - 3.0 standard drinks of alcohol    Types: 2 - 3 Shots of liquor per week   Drug use: No    Comment: smoked marijuana "years ago"    Sexual activity: Yes  Other Topics Concern   Not on file  Social History Narrative   Fun: Play pool, yard work, gym, and Scientist, research (life sciences)   Social Determinants of Health   Financial Resource Strain: Low Risk  (04/17/2021)   Overall Financial Resource Strain (CARDIA)    Difficulty of Paying Living Expenses: Not hard at all  Food Insecurity: No Food Insecurity (05/22/2020)   Hunger Vital Sign    Worried About Running Out of Food in the Last Year: Never true    [place] in the Last Year: Never true  Transportation Needs: No Transportation Needs (04/17/2021)   PRAPARE - Hydrologist (Medical): No    Lack of Transportation (Non-Medical): No  Physical Activity: Sufficiently Active (04/17/2021)   Exercise Vital Sign    Days of Exercise per Week: 5 days    Minutes of Exercise per Session: 30 min  Stress: No Stress Concern Present (04/17/2021)   [place]    Feeling of Stress : Not at all  Social Connections: [place] (04/17/2021)   Social Connection and Isolation Panel [NHANES]    Frequency of Communication with Friends and Family: More than three times a week    Frequency of Social Gatherings with Friends and Family: More than three times a week    Attends Religious Services: More than 4 times per year    Active Member of [employer] or Organizations: No    Attends Music therapist: More than 4 times per year    Marital Status: Living with partner     Family History: The patient's family history includes Bone cancer in his father; CAD (age of onset: 29) in his brother; Kidney cancer in his mother. There is no history of Colon cancer, Pancreatic cancer, Rectal cancer, or Stomach cancer.  ROS:   Please see the history of present illness.   All other systems reviewed and are negative.  Labs/Other Studies Reviewed:    The following studies were reviewed today:  CT Cardiac Score  03/19/22 FINDINGS: Coronary Calcium Score:   Left main: 0   Left anterior descending artery: 2860   Left circumflex artery: 25   Right coronary artery: 3057   Total: 5942   Percentile: 99th   Pericardium: Normal.   Non-cardiac: See separate report from [facility].   IMPRESSION: Coronary calcium score of 5942. This was 99th percentile for age-, race-, and sex-matched controls.    Recent Labs: 11/24/2022: ALT 43; BUN 24; Creatinine, Ser 1.20; Hemoglobin 13.6; Platelets 181.0; Potassium 4.8; Sodium 140; TSH 1.43  Recent Lipid Panel    Component Value Date/Time   CHOL 107 11/24/2022 0949   TRIG 61.0 11/24/2022 0949   HDL 37.40 (L) 11/24/2022 0949   CHOLHDL 3 11/24/2022 0949   VLDL 12.2 11/24/2022 0949   LDLCALC 57 11/24/2022 0949   LDLCALC 91 03/07/2020 1634   LDLDIRECT 78.0 10/19/2021 1100     Risk Assessment/Calculations:       Physical Exam:    VS:  BP 118/72   Pulse 99   Ht 5\' 8"  (1.727 m)   Wt 205 lb (93 kg)   SpO2 96%   BMI 31.17 kg/m     Wt Readings from Last 3 Encounters:  11/26/22 205 lb (93 kg)  11/24/22 205 lb 8 oz (93.2 kg)  08/27/22 219 lb (99.3 kg)     GEN:  Well nourished, well developed in no acute distress HEENT: Normal NECK: No JVD; No carotid bruits CARDIAC: RRR, no murmurs, rubs, gallops RESPIRATORY:  Clear to auscultation without rales, wheezing or rhonchi  ABDOMEN: Soft, non-tender, non-distended MUSCULOSKELETAL:  No edema; No deformity. 2+ pedal pulses, equal bilaterally SKIN: Warm and dry NEUROLOGIC:  Alert and oriented x 3 PSYCHIATRIC:  Normal affect   EKG:  EKG is not ordered today    Diagnoses:    1. Coronary artery disease involving native coronary artery of native heart without angina pectoris   2. Coronary artery calcification   3. Essential hypertension   4. Mixed hyperlipidemia    Assessment and Plan:     CAD without angina: CT cardiac score with coronary calcium score of 5942, 99th percentile for  age/sex matched controls. He denies chest pain, dyspnea, or other symptoms concerning for angina.  No indication for further ischemic evaluation at this time.  Continue GDMT including amlodipine, aspirin, ezetimibe, rosuvastatin.  Hyperlipidemia LDL goal < 55: LDL 57 on 11/24/2022 , down from 110 last year. He has improved his diet and is working out on a consistent basis. I congratulated him on 14 lb weight loss. Continue ezetimibe, rosuvastatin.  Hypertension: BP is well controlled. No medication changes today.     Disposition: 6 months with Dr. Acie Fredrickson  Medication Adjustments/Labs and Tests Ordered: Current medicines are reviewed at length with the patient today.  Concerns regarding medicines are outlined above.  No orders of the defined types were placed in this encounter.  No orders of the defined types were placed in this encounter.   Patient Instructions  Medication Instructions:   Your physician recommends that you continue on your current medications as directed. Please refer to the Current Medication list given to you today.   *If you need a refill on your cardiac medications before your next appointment, please call your pharmacy*   Lab Work:  None ordered.   If you have labs (blood work) drawn today and your tests are completely normal, you will receive your results only by: [place] (if you have MyChart) OR A paper copy in the mail If you have any lab test that is abnormal or we need to change your treatment, we will call you to review the results.   Testing/Procedures:  None ordered.   Follow-Up: At [facility], you and your health needs are our priority.  As part of our continuing mission to provide you with exceptional heart care, we have created designated Provider Care Teams.  These Care Teams include your primary Cardiologist (physician) and Advanced Practice Providers (APPs -  Physician Assistants and Nurse Practitioners) who all work  together to provide you with the care you need, when you need it.  We recommend signing up for the patient portal called "MyChart".  Sign up information is provided on this After Visit Summary.  MyChart is used to connect with patients for Virtual Visits (Telemedicine).  Patients are able to view lab/test results, encounter notes, upcoming appointments, etc.  Non-urgent messages can be sent to your provider as well.   To learn more about what you can do with MyChart, go to [email or web address].    Your next appointment:   6 month(s)  Provider:   Mertie Moores, MD     Mediterranean Diet A Mediterranean diet refers to food and lifestyle choices that are based on the traditions of countries located on the [place]. It focuses on eating more fruits, vegetables, whole grains, beans, nuts, seeds, and heart-healthy fats, and eating less dairy, meat, eggs, and processed foods with added sugar, salt, and fat. This way of eating has been shown to help prevent certain conditions and improve outcomes for people who have chronic diseases, like kidney disease and heart disease. What are tips for following this plan? Reading food labels Check the serving size of packaged foods. For foods such as rice and pasta, the serving size refers to the amount of cooked product, not dry. Check the total fat in packaged foods. Avoid foods that have saturated fat or trans fats. Check the ingredient list for added sugars, such as corn syrup. Shopping  Buy a variety of foods that offer a balanced diet, including: Fresh fruits and vegetables (produce). Grains, beans, nuts, and seeds. Some of these may be available in unpackaged forms or large amounts (in bulk). Fresh seafood. Poultry and eggs. Low-fat dairy products. Buy whole ingredients instead of prepackaged foods. Buy fresh fruits and vegetables in-season from local farmers markets. Buy plain frozen fruits and vegetables. If you do not have access to  quality fresh seafood, buy precooked frozen shrimp or canned fish, such as tuna, salmon, or sardines. Stock your pantry so you always have certain foods on hand, such as olive oil, canned tuna, canned tomatoes, rice, pasta, and beans. Cooking Cook foods with extra-virgin olive oil instead of using butter or other vegetable oils. Have meat as a side dish, and have vegetables or grains as your main dish. This means having meat in small portions or adding small amounts of meat to foods like pasta or stew. Use beans or vegetables instead of meat in common dishes like chili or lasagna. Experiment with different cooking methods. Try roasting, broiling, steaming, and sauting vegetables. Add frozen vegetables to soups, stews, pasta, or rice. Add nuts or seeds for added healthy fats and plant protein at each meal. You can add these to yogurt, salads, or vegetable dishes. Marinate fish or vegetables using olive oil, lemon juice, garlic, and fresh herbs. Meal planning Plan to eat one vegetarian meal one day each week. Try to work up to two vegetarian meals, if possible. Eat seafood two or more times a week. Have healthy snacks readily available, such as: Vegetable sticks with hummus. Greek yogurt. Fruit and nut trail mix. Eat balanced meals throughout the week. This includes: Fruit: 2-3 servings a day. Vegetables: 4-5 servings a day. Low-fat dairy: 2 servings a day. Fish, poultry, or lean meat: 1 serving a day. Beans and legumes: 2 or more servings a week. Nuts and seeds: 1-2 servings a day. Whole grains: 6-8 servings a day. Extra-virgin olive oil: 3-4 servings a day. Limit red meat  and sweets to only a few servings a month. Lifestyle  Cook and eat meals together with your family, when possible. Drink enough fluid to keep your urine pale yellow. Be physically active every day. This includes: Aerobic exercise like running or swimming. Leisure activities like gardening, walking, or  housework. Get 7-8 hours of sleep each night. If recommended by your health care provider, drink red wine in moderation. This means 1 glass a day for nonpregnant women and 2 glasses a day for men. A glass of wine equals 5 oz (150 mL). What foods should I eat? Fruits Apples. Apricots. Avocado. Berries. Bananas. Cherries. Dates. Figs. Grapes. Lemons. Melon. Oranges. Peaches. Plums. Pomegranate. Vegetables Artichokes. Beets. Broccoli. Cabbage. Carrots. Eggplant. Green beans. Chard. Kale. Spinach. Onions. Leeks. Peas. Squash. Tomatoes. Peppers. Radishes. Grains Whole-grain pasta. Brown rice. Bulgur wheat. Polenta. Couscous. Whole-wheat bread. [place]. Meats and other proteins Beans. Almonds. Sunflower seeds. Pine nuts. Peanuts. [place]. Salmon. Scallops. Shrimp. [place]. Tilapia. Clams. Oysters. Eggs. Poultry without skin. Dairy Low-fat milk. Cheese. Greek yogurt. Fats and oils Extra-virgin olive oil. Avocado oil. Grapeseed oil. Beverages Water. Red wine. Herbal tea. Sweets and desserts Greek yogurt with honey. Baked apples. Poached pears. Trail mix. Seasonings and condiments Basil. Cilantro. Coriander. Cumin. Mint. Parsley. Sage. Rosemary. Tarragon. Garlic. Oregano. Thyme. Pepper. Balsamic vinegar. Tahini. Hummus. Tomato sauce. Olives. Mushrooms. The items listed above may not be a complete list of foods and beverages you can eat. Contact a dietitian for more information. What foods should I limit? This is a list of foods that should be eaten rarely or only on special occasions. Fruits Fruit canned in syrup. Vegetables Deep-fried potatoes (french fries). Grains Prepackaged pasta or rice dishes. Prepackaged cereal with added sugar. Prepackaged snacks with added sugar. Meats and other proteins Beef. Pork. Lamb. Poultry with skin. Hot dogs. Berniece Salines. Dairy Ice cream. Sour cream. Whole milk. Fats and oils Butter. Canola oil. Vegetable oil. Beef fat (tallow). Lard. Beverages Juice.  Sugar-sweetened soft drinks. Beer. Liquor and spirits. Sweets and desserts Cookies. Cakes. Pies. Candy. Seasonings and condiments Mayonnaise. Pre-made sauces and marinades. The items listed above may not be a complete list of foods and beverages you should limit. Contact a dietitian for more information. Summary The Mediterranean diet includes both food and lifestyle choices. Eat a variety of fresh fruits and vegetables, beans, nuts, seeds, and whole grains. Limit the amount of red meat and sweets that you eat. If recommended by your health care provider, drink red wine in moderation. This means 1 glass a day for nonpregnant women and 2 glasses a day for men. A glass of wine equals 5 oz (150 mL). This information is not intended to replace advice given to you by your health care provider. Make sure you discuss any questions you have with your health care provider. Document Revised: 10/05/2019 Document Reviewed: 08/02/2019 Elsevier Patient Education  [place] Eating Plan DASH stands for Dietary Approaches to Stop Hypertension. The DASH eating plan is a healthy eating plan that has been shown to: Reduce high blood pressure (hypertension). Reduce your risk for type 2 diabetes, heart disease, and stroke. Help with weight loss. What are tips for following this plan? Reading food labels Check food labels for the amount of salt (sodium) per serving. Choose foods with less than 5 percent of the Daily Value of sodium. Generally, foods with less than 300 milligrams (mg) of sodium per serving fit into this eating plan. To find whole grains, look for the word "whole" as  the first word in the ingredient list. Shopping Buy products labeled as "low-sodium" or "no salt added." Buy fresh foods. Avoid canned foods and pre-made or frozen meals. Cooking Avoid adding salt when cooking. Use salt-free seasonings or herbs instead of table salt or sea salt. Check with your health care provider or  pharmacist before using salt substitutes. Do not fry foods. Cook foods using healthy methods such as baking, boiling, grilling, roasting, and broiling instead. Cook with heart-healthy oils, such as olive, canola, avocado, soybean, or sunflower oil. Meal planning  Eat a balanced diet that includes: 4 or more servings of fruits and 4 or more servings of vegetables each day. Try to fill one-half of your plate with fruits and vegetables. 6-8 servings of whole grains each day. Less than 6 oz (170 g) of lean meat, poultry, or fish each day. A 3-oz (85-g) serving of meat is about the same size as a deck of cards. One egg equals 1 oz (28 g). 2-3 servings of low-fat dairy each day. One serving is 1 cup (237 mL). 1 serving of nuts, seeds, or beans 5 times each week. 2-3 servings of heart-healthy fats. Healthy fats called omega-3 fatty acids are found in foods such as walnuts, flaxseeds, fortified milks, and eggs. These fats are also found in cold-water fish, such as sardines, salmon, and mackerel. Limit how much you eat of: Canned or prepackaged foods. Food that is high in trans fat, such as some fried foods. Food that is high in saturated fat, such as fatty meat. Desserts and other sweets, sugary drinks, and other foods with added sugar. Full-fat dairy products. Do not salt foods before eating. Do not eat more than 4 egg yolks a week. Try to eat at least 2 vegetarian meals a week. Eat more home-cooked food and less restaurant, buffet, and fast food. Lifestyle When eating at a restaurant, ask that your food be prepared with less salt or no salt, if possible. If you drink alcohol: Limit how much you use to: 0-1 drink a day for women who are not pregnant. 0-2 drinks a day for men. Be aware of how much alcohol is in your drink. In the [place] one drink equals one 12 oz bottle of beer (355 mL), one 5 oz glass of wine (148 mL), or one 1 oz glass of hard liquor (44 mL). General information Avoid eating  more than 2,300 mg of salt a day. If you have hypertension, you may need to reduce your sodium intake to 1,500 mg a day. Work with your health care provider to maintain a healthy body weight or to lose weight. Ask what an ideal weight is for you. Get at least 30 minutes of exercise that causes your heart to beat faster (aerobic exercise) most days of the week. Activities may include walking, swimming, or biking. Work with your health care provider or dietitian to adjust your eating plan to your individual calorie needs. What foods should I eat? Fruits All fresh, dried, or frozen fruit. Canned fruit in natural juice (without added sugar). Vegetables Fresh or frozen vegetables (raw, steamed, roasted, or grilled). Low-sodium or reduced-sodium tomato and vegetable juice. Low-sodium or reduced-sodium tomato sauce and tomato paste. Low-sodium or reduced-sodium canned vegetables. Grains Whole-grain or whole-wheat bread. Whole-grain or whole-wheat pasta. Brown rice. [place]. Bulgur. Whole-grain and low-sodium cereals. Pita bread. Low-fat, low-sodium crackers. Whole-wheat flour tortillas. Meats and other proteins Skinless chicken or [place]. Ground chicken or [place]. Pork with fat trimmed off. Fish and seafood.  Egg whites. Dried beans, peas, or lentils. Unsalted nuts, nut butters, and seeds. Unsalted canned beans. Lean cuts of beef with fat trimmed off. Low-sodium, lean precooked or cured meat, such as sausages or meat loaves. Dairy Low-fat (1%) or fat-free (skim) milk. Reduced-fat, low-fat, or fat-free cheeses. Nonfat, low-sodium ricotta or cottage cheese. Low-fat or nonfat yogurt. Low-fat, low-sodium cheese. Fats and oils Soft margarine without trans fats. Vegetable oil. Reduced-fat, low-fat, or light mayonnaise and salad dressings (reduced-sodium). Canola, safflower, olive, avocado, soybean, and sunflower oils. Avocado. Seasonings and condiments Herbs. Spices. Seasoning mixes without salt. Other  foods Unsalted popcorn and pretzels. Fat-free sweets. The items listed above may not be a complete list of foods and beverages you can eat. Contact a dietitian for more information. What foods should I avoid? Fruits Canned fruit in a light or heavy syrup. Fried fruit. Fruit in cream or butter sauce. Vegetables Creamed or fried vegetables. Vegetables in a cheese sauce. Regular canned vegetables (not low-sodium or reduced-sodium). Regular canned tomato sauce and paste (not low-sodium or reduced-sodium). Regular tomato and vegetable juice (not low-sodium or reduced-sodium). Angie Fava. Olives. Grains Baked goods made with fat, such as croissants, muffins, or some breads. Dry pasta or rice meal packs. Meats and other proteins Fatty cuts of meat. Ribs. Fried meat. Berniece Salines. Bologna, salami, and other precooked or cured meats, such as sausages or meat loaves. Fat from the back of a pig (fatback). Bratwurst. Salted nuts and seeds. Canned beans with added salt. Canned or smoked fish. Whole eggs or egg yolks. Chicken or [place] with skin. Dairy Whole or 2% milk, cream, and half-and-half. Whole or full-fat cream cheese. Whole-fat or sweetened yogurt. Full-fat cheese. Nondairy creamers. Whipped toppings. Processed cheese and cheese spreads. Fats and oils Butter. Stick margarine. Lard. Shortening. Ghee. Bacon fat. Tropical oils, such as coconut, palm kernel, or palm oil. Seasonings and condiments Onion salt, garlic salt, seasoned salt, table salt, and sea salt. Worcestershire sauce. Tartar sauce. Barbecue sauce. Teriyaki sauce. Soy sauce, including reduced-sodium. Steak sauce. Canned and packaged gravies. Fish sauce. Oyster sauce. Cocktail sauce. Store-bought horseradish. Ketchup. Mustard. Meat flavorings and tenderizers. Bouillon cubes. Hot sauces. Pre-made or packaged marinades. Pre-made or packaged taco seasonings. Relishes. Regular salad dressings. Other foods Salted popcorn and pretzels. The items listed above  may not be a complete list of foods and beverages you should avoid. Contact a dietitian for more information. Where to find more information National Heart, Lung, and Blood Institute: https://wilson-eaton.com/ American Heart Association: www.heart.org Academy of Nutrition and Dietetics: www.eatright.[place]: www.kidney.org Summary The DASH eating plan is a healthy eating plan that has been shown to reduce high blood pressure (hypertension). It may also reduce your risk for type 2 diabetes, heart disease, and stroke. When on the DASH eating plan, aim to eat more fresh fruits and vegetables, whole grains, lean proteins, low-fat dairy, and heart-healthy fats. With the DASH eating plan, you should limit salt (sodium) intake to 2,300 mg a day. If you have hypertension, you may need to reduce your sodium intake to 1,500 mg a day. Work with your health care provider or dietitian to adjust your eating plan to your individual calorie needs. This information is not intended to replace advice given to you by your health care provider. Make sure you discuss any questions you have with your health care provider. Document Revised: 08/03/2019 Document Reviewed: 08/03/2019 Elsevier Patient Education  [place]. Adopting a Healthy Lifestyle.   Weight: Know what a healthy weight is for you (  roughly BMI <25) and aim to maintain this. You can calculate your body mass index on your smart phone  Diet: Aim for 7+ servings of fruits and vegetables daily Limit animal fats in diet for cholesterol and heart health - choose grass fed whenever available Avoid highly processed foods (fast food burgers, tacos, fried chicken, pizza, hot dogs, french fries)  Saturated fat comes in the form of butter, lard, coconut oil, margarine, partially hydrogenated oils, and fat in meat. These increase your risk of cardiovascular disease.  Use healthy plant oils, such as olive, canola, soy, corn, sunflower and  peanut.  Whole foods such as fruits, vegetables and whole grains have fiber  Men need > 38 grams of fiber per day Women need > 25 grams of fiber per day  Load up on vegetables and fruits - one-half of your plate: Aim for color and variety, and remember that potatoes dont count. Go for whole grains - one-quarter of your plate: Whole wheat, barley, wheat berries, quinoa, oats, brown rice, and foods made with them. If you want pasta, go with whole wheat pasta. Protein power - one-quarter of your plate: Fish, chicken, beans, and nuts are all healthy, versatile protein sources. Limit red meat. You need carbohydrates for energy! The type of carbohydrate is more important than the amount. Choose carbohydrates such as vegetables, fruits, whole grains, beans, and nuts in the place of white rice, white pasta, potatoes (baked or fried), macaroni and cheese, cakes, cookies, and donuts.  If youre thirsty, drink water. Coffee and tea are good in moderation, but skip sugary drinks and limit milk and dairy products to one or two daily servings. Keep sugar intake at 6 teaspoons or 24 grams or LESS       Exercise: Aim for 150 min of moderate intensity exercise weekly for heart health, and weights twice weekly for bone health Stay active - any steps are better than no steps! Aim for 7-9 hours of sleep daily           Signed, Emmaline Life, NP  11/26/2022 1:15 PM    [place]

## 2022-11-26 ENCOUNTER — Encounter: Payer: Self-pay | Admitting: Internal Medicine

## 2022-11-26 ENCOUNTER — Encounter: Payer: Self-pay | Admitting: Nurse Practitioner

## 2022-11-26 ENCOUNTER — Ambulatory Visit: Payer: Medicare HMO | Attending: Nurse Practitioner | Admitting: Nurse Practitioner

## 2022-11-26 VITALS — BP 118/72 | HR 99 | Ht 68.0 in | Wt 205.0 lb

## 2022-11-26 DIAGNOSIS — I10 Essential (primary) hypertension: Secondary | ICD-10-CM | POA: Diagnosis not present

## 2022-11-26 DIAGNOSIS — E782 Mixed hyperlipidemia: Secondary | ICD-10-CM

## 2022-11-26 DIAGNOSIS — I2584 Coronary atherosclerosis due to calcified coronary lesion: Secondary | ICD-10-CM

## 2022-11-26 DIAGNOSIS — I2510 Atherosclerotic heart disease of native coronary artery without angina pectoris: Secondary | ICD-10-CM | POA: Diagnosis not present

## 2022-11-26 NOTE — Assessment & Plan Note (Signed)
Lab Results  Component Value Date   HGBA1C 6.2 11/24/2022   Stable, pt to continue current medical treatment  - diet, wt control

## 2022-11-26 NOTE — Assessment & Plan Note (Signed)
Lab Results  Component Value Date   LDLCALC 57 11/24/2022   Stable, pt to continue current statin zetia 10 mg, crestor 40 mg qd

## 2022-11-26 NOTE — Assessment & Plan Note (Signed)
BP Readings from Last 3 Encounters:  11/26/22 118/72  11/24/22 134/86  08/27/22 136/88   Stable, pt to continue medical treatment norvasc 5 mg qd

## 2022-11-26 NOTE — Patient Instructions (Addendum)
Medication Instructions:   Your physician recommends that you continue on your current medications as directed. Please refer to the Current Medication list given to you today.   *If you need a refill on your cardiac medications before your next appointment, please call your pharmacy*   Lab Work:  None ordered.   If you have labs (blood work) drawn today and your tests are completely normal, you will receive your results only by: [place] (if you have MyChart) OR A paper copy in the mail If you have any lab test that is abnormal or we need to change your treatment, we will call you to review the results.   Testing/Procedures:  None ordered.   Follow-Up: At [facility], you and your health needs are our priority.  As part of our continuing mission to provide you with exceptional heart care, we have created designated Provider Care Teams.  These Care Teams include your primary Cardiologist (physician) and Advanced Practice Providers (APPs -  Physician Assistants and Nurse Practitioners) who all work together to provide you with the care you need, when you need it.  We recommend signing up for the patient portal called "MyChart".  Sign up information is provided on this After Visit Summary.  MyChart is used to connect with patients for Virtual Visits (Telemedicine).  Patients are able to view lab/test results, encounter notes, upcoming appointments, etc.  Non-urgent messages can be sent to your provider as well.   To learn more about what you can do with MyChart, go to [email or web address].    Your next appointment:   6 month(s)  Provider:   Mertie Moores, MD     Mediterranean Diet A Mediterranean diet refers to food and lifestyle choices that are based on the traditions of countries located on the [place]. It focuses on eating more fruits, vegetables, whole grains, beans, nuts, seeds, and heart-healthy fats, and eating less dairy, meat, eggs, and  processed foods with added sugar, salt, and fat. This way of eating has been shown to help prevent certain conditions and improve outcomes for people who have chronic diseases, like kidney disease and heart disease. What are tips for following this plan? Reading food labels Check the serving size of packaged foods. For foods such as rice and pasta, the serving size refers to the amount of cooked product, not dry. Check the total fat in packaged foods. Avoid foods that have saturated fat or trans fats. Check the ingredient list for added sugars, such as corn syrup. Shopping  Buy a variety of foods that offer a balanced diet, including: Fresh fruits and vegetables (produce). Grains, beans, nuts, and seeds. Some of these may be available in unpackaged forms or large amounts (in bulk). Fresh seafood. Poultry and eggs. Low-fat dairy products. Buy whole ingredients instead of prepackaged foods. Buy fresh fruits and vegetables in-season from local farmers markets. Buy plain frozen fruits and vegetables. If you do not have access to quality fresh seafood, buy precooked frozen shrimp or canned fish, such as tuna, salmon, or sardines. Stock your pantry so you always have certain foods on hand, such as olive oil, canned tuna, canned tomatoes, rice, pasta, and beans. Cooking Cook foods with extra-virgin olive oil instead of using butter or other vegetable oils. Have meat as a side dish, and have vegetables or grains as your main dish. This means having meat in small portions or adding small amounts of meat to foods like pasta or stew. Use beans or vegetables instead of  meat in common dishes like chili or lasagna. Experiment with different cooking methods. Try roasting, broiling, steaming, and sauting vegetables. Add frozen vegetables to soups, stews, pasta, or rice. Add nuts or seeds for added healthy fats and plant protein at each meal. You can add these to yogurt, salads, or vegetable  dishes. Marinate fish or vegetables using olive oil, lemon juice, garlic, and fresh herbs. Meal planning Plan to eat one vegetarian meal one day each week. Try to work up to two vegetarian meals, if possible. Eat seafood two or more times a week. Have healthy snacks readily available, such as: Vegetable sticks with hummus. Greek yogurt. Fruit and nut trail mix. Eat balanced meals throughout the week. This includes: Fruit: 2-3 servings a day. Vegetables: 4-5 servings a day. Low-fat dairy: 2 servings a day. Fish, poultry, or lean meat: 1 serving a day. Beans and legumes: 2 or more servings a week. Nuts and seeds: 1-2 servings a day. Whole grains: 6-8 servings a day. Extra-virgin olive oil: 3-4 servings a day. Limit red meat and sweets to only a few servings a month. Lifestyle  Cook and eat meals together with your family, when possible. Drink enough fluid to keep your urine pale yellow. Be physically active every day. This includes: Aerobic exercise like running or swimming. Leisure activities like gardening, walking, or housework. Get 7-8 hours of sleep each night. If recommended by your health care provider, drink red wine in moderation. This means 1 glass a day for nonpregnant women and 2 glasses a day for men. A glass of wine equals 5 oz (150 mL). What foods should I eat? Fruits Apples. Apricots. Avocado. Berries. Bananas. Cherries. Dates. Figs. Grapes. Lemons. Melon. Oranges. Peaches. Plums. Pomegranate. Vegetables Artichokes. Beets. Broccoli. Cabbage. Carrots. Eggplant. Green beans. Chard. Kale. Spinach. Onions. Leeks. Peas. Squash. Tomatoes. Peppers. Radishes. Grains Whole-grain pasta. Brown rice. Bulgur wheat. Polenta. Couscous. Whole-wheat bread. [place]. Meats and other proteins Beans. Almonds. Sunflower seeds. Pine nuts. Peanuts. [place]. Salmon. Scallops. Shrimp. [place]. Tilapia. Clams. Oysters. Eggs. Poultry without skin. Dairy Low-fat milk. Cheese. Greek  yogurt. Fats and oils Extra-virgin olive oil. Avocado oil. Grapeseed oil. Beverages Water. Red wine. Herbal tea. Sweets and desserts Greek yogurt with honey. Baked apples. Poached pears. Trail mix. Seasonings and condiments Basil. Cilantro. Coriander. Cumin. Mint. Parsley. Sage. Rosemary. Tarragon. Garlic. Oregano. Thyme. Pepper. Balsamic vinegar. Tahini. Hummus. Tomato sauce. Olives. Mushrooms. The items listed above may not be a complete list of foods and beverages you can eat. Contact a dietitian for more information. What foods should I limit? This is a list of foods that should be eaten rarely or only on special occasions. Fruits Fruit canned in syrup. Vegetables Deep-fried potatoes (french fries). Grains Prepackaged pasta or rice dishes. Prepackaged cereal with added sugar. Prepackaged snacks with added sugar. Meats and other proteins Beef. Pork. Lamb. Poultry with skin. Hot dogs. Berniece Salines. Dairy Ice cream. Sour cream. Whole milk. Fats and oils Butter. Canola oil. Vegetable oil. Beef fat (tallow). Lard. Beverages Juice. Sugar-sweetened soft drinks. Beer. Liquor and spirits. Sweets and desserts Cookies. Cakes. Pies. Candy. Seasonings and condiments Mayonnaise. Pre-made sauces and marinades. The items listed above may not be a complete list of foods and beverages you should limit. Contact a dietitian for more information. Summary The Mediterranean diet includes both food and lifestyle choices. Eat a variety of fresh fruits and vegetables, beans, nuts, seeds, and whole grains. Limit the amount of red meat and sweets that you eat. If recommended by your health care  provider, drink red wine in moderation. This means 1 glass a day for nonpregnant women and 2 glasses a day for men. A glass of wine equals 5 oz (150 mL). This information is not intended to replace advice given to you by your health care provider. Make sure you discuss any questions you have with your health care  provider. Document Revised: 10/05/2019 Document Reviewed: 08/02/2019 Elsevier Patient Education  [place] Eating Plan DASH stands for Dietary Approaches to Stop Hypertension. The DASH eating plan is a healthy eating plan that has been shown to: Reduce high blood pressure (hypertension). Reduce your risk for type 2 diabetes, heart disease, and stroke. Help with weight loss. What are tips for following this plan? Reading food labels Check food labels for the amount of salt (sodium) per serving. Choose foods with less than 5 percent of the Daily Value of sodium. Generally, foods with less than 300 milligrams (mg) of sodium per serving fit into this eating plan. To find whole grains, look for the word "whole" as the first word in the ingredient list. Shopping Buy products labeled as "low-sodium" or "no salt added." Buy fresh foods. Avoid canned foods and pre-made or frozen meals. Cooking Avoid adding salt when cooking. Use salt-free seasonings or herbs instead of table salt or sea salt. Check with your health care provider or pharmacist before using salt substitutes. Do not fry foods. Cook foods using healthy methods such as baking, boiling, grilling, roasting, and broiling instead. Cook with heart-healthy oils, such as olive, canola, avocado, soybean, or sunflower oil. Meal planning  Eat a balanced diet that includes: 4 or more servings of fruits and 4 or more servings of vegetables each day. Try to fill one-half of your plate with fruits and vegetables. 6-8 servings of whole grains each day. Less than 6 oz (170 g) of lean meat, poultry, or fish each day. A 3-oz (85-g) serving of meat is about the same size as a deck of cards. One egg equals 1 oz (28 g). 2-3 servings of low-fat dairy each day. One serving is 1 cup (237 mL). 1 serving of nuts, seeds, or beans 5 times each week. 2-3 servings of heart-healthy fats. Healthy fats called omega-3 fatty acids are found in foods such  as walnuts, flaxseeds, fortified milks, and eggs. These fats are also found in cold-water fish, such as sardines, salmon, and mackerel. Limit how much you eat of: Canned or prepackaged foods. Food that is high in trans fat, such as some fried foods. Food that is high in saturated fat, such as fatty meat. Desserts and other sweets, sugary drinks, and other foods with added sugar. Full-fat dairy products. Do not salt foods before eating. Do not eat more than 4 egg yolks a week. Try to eat at least 2 vegetarian meals a week. Eat more home-cooked food and less restaurant, buffet, and fast food. Lifestyle When eating at a restaurant, ask that your food be prepared with less salt or no salt, if possible. If you drink alcohol: Limit how much you use to: 0-1 drink a day for women who are not pregnant. 0-2 drinks a day for men. Be aware of how much alcohol is in your drink. In the [place] one drink equals one 12 oz bottle of beer (355 mL), one 5 oz glass of wine (148 mL), or one 1 oz glass of hard liquor (44 mL). General information Avoid eating more than 2,300 mg of salt a day. If you have hypertension,  you may need to reduce your sodium intake to 1,500 mg a day. Work with your health care provider to maintain a healthy body weight or to lose weight. Ask what an ideal weight is for you. Get at least 30 minutes of exercise that causes your heart to beat faster (aerobic exercise) most days of the week. Activities may include walking, swimming, or biking. Work with your health care provider or dietitian to adjust your eating plan to your individual calorie needs. What foods should I eat? Fruits All fresh, dried, or frozen fruit. Canned fruit in natural juice (without added sugar). Vegetables Fresh or frozen vegetables (raw, steamed, roasted, or grilled). Low-sodium or reduced-sodium tomato and vegetable juice. Low-sodium or reduced-sodium tomato sauce and tomato paste. Low-sodium or reduced-sodium  canned vegetables. Grains Whole-grain or whole-wheat bread. Whole-grain or whole-wheat pasta. Brown rice. [place]. Bulgur. Whole-grain and low-sodium cereals. Pita bread. Low-fat, low-sodium crackers. Whole-wheat flour tortillas. Meats and other proteins Skinless chicken or [place]. Ground chicken or [place]. Pork with fat trimmed off. Fish and seafood. Egg whites. Dried beans, peas, or lentils. Unsalted nuts, nut butters, and seeds. Unsalted canned beans. Lean cuts of beef with fat trimmed off. Low-sodium, lean precooked or cured meat, such as sausages or meat loaves. Dairy Low-fat (1%) or fat-free (skim) milk. Reduced-fat, low-fat, or fat-free cheeses. Nonfat, low-sodium ricotta or cottage cheese. Low-fat or nonfat yogurt. Low-fat, low-sodium cheese. Fats and oils Soft margarine without trans fats. Vegetable oil. Reduced-fat, low-fat, or light mayonnaise and salad dressings (reduced-sodium). Canola, safflower, olive, avocado, soybean, and sunflower oils. Avocado. Seasonings and condiments Herbs. Spices. Seasoning mixes without salt. Other foods Unsalted popcorn and pretzels. Fat-free sweets. The items listed above may not be a complete list of foods and beverages you can eat. Contact a dietitian for more information. What foods should I avoid? Fruits Canned fruit in a light or heavy syrup. Fried fruit. Fruit in cream or butter sauce. Vegetables Creamed or fried vegetables. Vegetables in a cheese sauce. Regular canned vegetables (not low-sodium or reduced-sodium). Regular canned tomato sauce and paste (not low-sodium or reduced-sodium). Regular tomato and vegetable juice (not low-sodium or reduced-sodium). Angie Fava. Olives. Grains Baked goods made with fat, such as croissants, muffins, or some breads. Dry pasta or rice meal packs. Meats and other proteins Fatty cuts of meat. Ribs. Fried meat. Berniece Salines. Bologna, salami, and other precooked or cured meats, such as sausages or meat loaves. Fat  from the back of a pig (fatback). Bratwurst. Salted nuts and seeds. Canned beans with added salt. Canned or smoked fish. Whole eggs or egg yolks. Chicken or [place] with skin. Dairy Whole or 2% milk, cream, and half-and-half. Whole or full-fat cream cheese. Whole-fat or sweetened yogurt. Full-fat cheese. Nondairy creamers. Whipped toppings. Processed cheese and cheese spreads. Fats and oils Butter. Stick margarine. Lard. Shortening. Ghee. Bacon fat. Tropical oils, such as coconut, palm kernel, or palm oil. Seasonings and condiments Onion salt, garlic salt, seasoned salt, table salt, and sea salt. Worcestershire sauce. Tartar sauce. Barbecue sauce. Teriyaki sauce. Soy sauce, including reduced-sodium. Steak sauce. Canned and packaged gravies. Fish sauce. Oyster sauce. Cocktail sauce. Store-bought horseradish. Ketchup. Mustard. Meat flavorings and tenderizers. Bouillon cubes. Hot sauces. Pre-made or packaged marinades. Pre-made or packaged taco seasonings. Relishes. Regular salad dressings. Other foods Salted popcorn and pretzels. The items listed above may not be a complete list of foods and beverages you should avoid. Contact a dietitian for more information. Where to find more information National Heart, Lung, and Blood Institute: https://wilson-eaton.com/  American Heart Association: www.heart.org Academy of Nutrition and Dietetics: www.eatright.[place]: www.kidney.org Summary The DASH eating plan is a healthy eating plan that has been shown to reduce high blood pressure (hypertension). It may also reduce your risk for type 2 diabetes, heart disease, and stroke. When on the DASH eating plan, aim to eat more fresh fruits and vegetables, whole grains, lean proteins, low-fat dairy, and heart-healthy fats. With the DASH eating plan, you should limit salt (sodium) intake to 2,300 mg a day. If you have hypertension, you may need to reduce your sodium intake to 1,500 mg a day. Work with  your health care provider or dietitian to adjust your eating plan to your individual calorie needs. This information is not intended to replace advice given to you by your health care provider. Make sure you discuss any questions you have with your health care provider. Document Revised: 08/03/2019 Document Reviewed: 08/03/2019 Elsevier Patient Education  [place]. Adopting a Healthy Lifestyle.   Weight: Know what a healthy weight is for you (roughly BMI <25) and aim to maintain this. You can calculate your body mass index on your smart phone  Diet: Aim for 7+ servings of fruits and vegetables daily Limit animal fats in diet for cholesterol and heart health - choose grass fed whenever available Avoid highly processed foods (fast food burgers, tacos, fried chicken, pizza, hot dogs, french fries)  Saturated fat comes in the form of butter, lard, coconut oil, margarine, partially hydrogenated oils, and fat in meat. These increase your risk of cardiovascular disease.  Use healthy plant oils, such as olive, canola, soy, corn, sunflower and peanut.  Whole foods such as fruits, vegetables and whole grains have fiber  Men need > 38 grams of fiber per day Women need > 25 grams of fiber per day  Load up on vegetables and fruits - one-half of your plate: Aim for color and variety, and remember that potatoes dont count. Go for whole grains - one-quarter of your plate: Whole wheat, barley, wheat berries, quinoa, oats, brown rice, and foods made with them. If you want pasta, go with whole wheat pasta. Protein power - one-quarter of your plate: Fish, chicken, beans, and nuts are all healthy, versatile protein sources. Limit red meat. You need carbohydrates for energy! The type of carbohydrate is more important than the amount. Choose carbohydrates such as vegetables, fruits, whole grains, beans, and nuts in the place of white rice, white pasta, potatoes (baked or fried), macaroni and cheese, cakes,  cookies, and donuts.  If youre thirsty, drink water. Coffee and tea are good in moderation, but skip sugary drinks and limit milk and dairy products to one or two daily servings. Keep sugar intake at 6 teaspoons or 24 grams or LESS       Exercise: Aim for 150 min of moderate intensity exercise weekly for heart health, and weights twice weekly for bone health Stay active - any steps are better than no steps! Aim for 7-9 hours of sleep daily

## 2022-11-26 NOTE — Assessment & Plan Note (Signed)

## 2022-11-26 NOTE — Assessment & Plan Note (Signed)
Last vitamin D Lab Results  Component Value Date   VD25OH 33.53 11/24/2022   Low, to start oral replacement

## 2023-03-02 ENCOUNTER — Other Ambulatory Visit: Payer: Self-pay

## 2023-03-02 MED ORDER — ROSUVASTATIN CALCIUM 40 MG PO TABS: 40.0000 mg | ORAL_TABLET | Freq: Every day | ORAL | 3 refills | Status: DC

## 2023-03-14 ENCOUNTER — Other Ambulatory Visit: Payer: Self-pay

## 2023-03-14 DIAGNOSIS — I10 Essential (primary) hypertension: Secondary | ICD-10-CM

## 2023-03-14 MED ORDER — AMLODIPINE BESYLATE 5 MG PO TABS: ORAL_TABLET | ORAL | 3 refills | Status: DC

## 2023-05-05 ENCOUNTER — Other Ambulatory Visit: Payer: Self-pay

## 2023-05-05 MED ORDER — TRAZODONE HCL 50 MG PO TABS: 25.0000 mg | ORAL_TABLET | Freq: Every evening | ORAL | 1 refills | Status: DC | PRN

## 2023-05-06 ENCOUNTER — Encounter: Payer: Self-pay | Admitting: Internal Medicine

## 2023-05-06 ENCOUNTER — Ambulatory Visit: Payer: Medicare HMO | Admitting: Internal Medicine

## 2023-05-06 VITALS — BP 122/82 | HR 87 | Temp 98.4°F | Ht 68.0 in | Wt 210.0 lb

## 2023-05-06 DIAGNOSIS — E559 Vitamin D deficiency, unspecified: Secondary | ICD-10-CM | POA: Diagnosis not present

## 2023-05-06 DIAGNOSIS — R739 Hyperglycemia, unspecified: Secondary | ICD-10-CM | POA: Diagnosis not present

## 2023-05-06 DIAGNOSIS — E782 Mixed hyperlipidemia: Secondary | ICD-10-CM | POA: Diagnosis not present

## 2023-05-06 DIAGNOSIS — I10 Essential (primary) hypertension: Secondary | ICD-10-CM

## 2023-05-06 LAB — BASIC METABOLIC PANEL
BUN: 17 mg/dL (ref 6–23)
CO2: 29 mEq/L (ref 19–32)
Calcium: 9.5 mg/dL (ref 8.4–10.5)
Chloride: 106 mEq/L (ref 96–112)
Creatinine, Ser: 1.09 mg/dL (ref 0.40–1.50)
GFR: 66.43 mL/min (ref >60.00)
Glucose, Bld: 116 mg/dL — ABNORMAL HIGH (ref 70–99)
Potassium: 4.3 mEq/L (ref 3.5–5.1)
Sodium: 142 mEq/L (ref 135–145)

## 2023-05-06 LAB — HEPATIC FUNCTION PANEL
ALT: 38 U/L (ref 0–53)
AST: 35 U/L (ref 0–37)
Albumin: 4.3 g/dL (ref 3.5–5.2)
Alkaline Phosphatase: 61 U/L (ref 39–117)
Bilirubin, Direct: 0.1 mg/dL (ref 0.0–0.3)
Total Bilirubin: 0.5 mg/dL (ref 0.2–1.2)
Total Protein: 7.7 g/dL (ref 6.0–8.3)

## 2023-05-06 LAB — HEMOGLOBIN A1C: Hgb A1c MFr Bld: 6.1 % (ref 4.6–6.5)

## 2023-05-06 LAB — VITAMIN D 25 HYDROXY (VIT D DEFICIENCY, FRACTURES): VITD: 42.01 ng/mL (ref 30.00–100.00)

## 2023-05-06 LAB — LIPID PANEL
Cholesterol: 119 mg/dL (ref 0–200)
HDL: 37.1 mg/dL — ABNORMAL LOW (ref >39.00)
LDL Cholesterol: 70 mg/dL (ref 0–99)
NonHDL: 81.47
Total CHOL/HDL Ratio: 3
Triglycerides: 59 mg/dL (ref 0.0–149.0)
VLDL: 11.8 mg/dL (ref 0.0–40.0)

## 2023-05-06 NOTE — Patient Instructions (Signed)
Please continue all other medications as before, and refills have been done if requested.  Please have the pharmacy call with any other refills you may need.  Please continue your efforts at being more active, low cholesterol diet, and weight control.  Please keep your appointments with your specialists as you may have planned - cardiology next month  Please take OTC Vitamin D3 at 2000 units per day, indefinitely,   Please make an Appointment to return in 6 months, or sooner if needed

## 2023-05-06 NOTE — Progress Notes (Unsigned)
Patient ID: Christopher Mcfarland, male   DOB: Sep 30, 1945, 77 y.o.   MRN: [id number]        Chief Complaint: follow up HTN, HLD and hyperglycemia , low vit d       HPI:  Christopher Mcfarland is a 77 y.o. male here overall doing ok,  Pt denies chest pain, increased sob or doe, wheezing, orthopnea, PND, increased LE swelling, palpitations, dizziness or syncope.   Pt denies polydipsia, polyuria, or new focal neuro s/s.    Pt denies fever, wt loss, night sweats, loss of appetite, or other constitutional symptoms         Wt Readings from Last 3 Encounters:  05/06/23 210 lb (95.3 kg)  11/26/22 205 lb (93 kg)  11/24/22 205 lb 8 oz (93.2 kg)   BP Readings from Last 3 Encounters:  05/06/23 122/82  11/26/22 118/72  11/24/22 134/86         Past Medical History:  Diagnosis Date   Essential hypertension    Gout 01/02/2020   Gout crystals on joint fluid April 2021   Hepatitis C 2003   Treated   Hyperlipidemia    Past Surgical History:  Procedure Laterality Date   CARPAL TUNNEL RELEASE     bilat 1999   HEMORRHOID SURGERY     1999/4 polyps removed rectum   TRIGGER FINGER RELEASE     right hand 1999    reports that he has never smoked. He has never used smokeless tobacco. He reports that he does not currently use alcohol after a past usage of about 2.0 - 3.0 standard drinks of alcohol per week. He reports that he does not use drugs. family history includes Bone cancer in his father; CAD (age of onset: 11) in his brother; Kidney cancer in his mother. No Known Allergies Current Outpatient Medications on File Prior to Visit  Medication Sig Dispense Refill   allopurinol (ZYLOPRIM) 300 MG tablet TAKE 1 TABLET(300 MG) BY MOUTH DAILY 90 tablet 2   amLODipine (NORVASC) 5 MG tablet TAKE 1 TABLET(5 MG) BY MOUTH DAILY 90 tablet 3   aspirin EC 81 MG tablet Take 1 tablet (81 mg total) by mouth daily. Swallow whole. 30 tablet 12   Cholecalciferol 50 MCG (2000 UT) TABS 1 tab by mouth once daily 30 tablet 99    Colchicine 0.6 MG CAPS TAKE 1 CAPSULE BY MOUTH DAILY AS NEEDED FOR GOUT 90 capsule 1   diclofenac Sodium (VOLTAREN) 1 % GEL Apply 4 g topically 4 (four) times daily. 100 g 0   ezetimibe (ZETIA) 10 MG tablet Take 1 tablet (10 mg total) by mouth daily. 90 tablet 3   guaiFENesin-codeine 100-10 MG/5ML syrup Take 10 mLs by mouth every 6 (six) hours as needed for cough. 118 mL 0   ipratropium (ATROVENT) 0.06 % nasal spray Place 2 sprays into both nostrils 3 (three) times daily.     mupirocin ointment (BACTROBAN) 2 % APPLY WITHIN BOTH NASAL PASSAGES TWICE DAILY FOR 2 WEEKS     rosuvastatin (CRESTOR) 40 MG tablet Take 1 tablet (40 mg total) by mouth daily. 90 tablet 3   sildenafil (VIAGRA) 100 MG tablet TAKE ONE-HALF TO ONE TABLET BY MOUTH ONE TIME DAILY AS NEEDED FOR ERECTILE DYSFUNCTION 5 tablet 10   tadalafil (CIALIS) 20 MG tablet Take 0.5-1 tablets (10-20 mg total) by mouth every other day as needed for erectile dysfunction. 10 tablet 11   traZODone (DESYREL) 50 MG tablet Take 0.5-1 tablets (25-50 mg total) by  mouth at bedtime as needed for sleep. 90 tablet 1   No current facility-administered medications on file prior to visit.        ROS:  All others reviewed and negative.  Objective        PE:  BP 122/82 (BP Location: Right Arm, Patient Position: Sitting, Cuff Size: Normal)   Pulse 87   Temp 98.4 F (36.9 C) (Oral)   Ht 5\' 8"  (1.727 m)   Wt 210 lb (95.3 kg)   SpO2 99%   BMI 31.93 kg/m                 Constitutional: Pt appears in NAD               HENT: Head: NCAT.                Right Ear: External ear normal.                 Left Ear: External ear normal.                Eyes: . Pupils are equal, round, and reactive to light. Conjunctivae and EOM are normal               Nose: without d/c or deformity               Neck: Neck supple. Gross normal ROM               Cardiovascular: Normal rate and regular rhythm.                 Pulmonary/Chest: Effort normal and breath sounds  without rales or wheezing.                Abd:  Soft, NT, ND, + BS, no organomegaly               Neurological: Pt is alert. At baseline orientation, motor grossly intact               Skin: Skin is warm. No rashes, no other new lesions, LE edema - none               Psychiatric: Pt behavior is normal without agitation   Micro: none  Cardiac tracings I have personally interpreted today:  none  Pertinent Radiological findings (summarize): none   Lab Results  Component Value Date   WBC 4.4 11/24/2022   HGB 13.6 11/24/2022   HCT 41.1 11/24/2022   PLT 181.0 11/24/2022   GLUCOSE 116 (H) 05/06/2023   CHOL 119 05/06/2023   TRIG 59.0 05/06/2023   HDL 37.10 (L) 05/06/2023   LDLDIRECT 78.0 10/19/2021   LDLCALC 70 05/06/2023   ALT 38 05/06/2023   AST 35 05/06/2023   NA 142 05/06/2023   K 4.3 05/06/2023   CL 106 05/06/2023   CREATININE 1.09 05/06/2023   BUN 17 05/06/2023   CO2 29 05/06/2023   TSH 1.43 11/24/2022   PSA 0.85 11/24/2022   INR 1.17 08/20/2015   HGBA1C 6.1 05/06/2023   Assessment/Plan:  Christopher Mcfarland is a 77 y.o. Black or African American [2] male with  has a past medical history of Essential hypertension, Gout (01/02/2020), Hepatitis C (2003), and Hyperlipidemia.  Essential hypertension BP Readings from Last 3 Encounters:  05/06/23 122/82  11/26/22 118/72  11/24/22 134/86   Stable, pt to continue medical treatment norvasc 5 qd   Hyperglycemia Lab Results  Component Value Date   HGBA1C  6.1 05/06/2023   Stable, pt to continue current medical treatment  - diet  wt control   Hyperlipidemia Lab Results  Component Value Date   LDLCALC 70 05/06/2023   Uncontrolled, goal ldl < 70, pt to continue current statin crestor 40 every day, zetia 10 every day and lower chol diet, declines add repatha   Vitamin D deficiency Last vitamin D Lab Results  Component Value Date   VD25OH 42.01 05/06/2023   Stable, cont oral replacement  Followup: Return in about 6  months (around 11/06/2023).  Oliver Barre, MD 05/07/2023 9:11 PM [facility]

## 2023-05-07 ENCOUNTER — Encounter: Payer: Self-pay | Admitting: Internal Medicine

## 2023-05-07 NOTE — Assessment & Plan Note (Signed)
BP Readings from Last 3 Encounters:  05/06/23 122/82  11/26/22 118/72  11/24/22 134/86   Stable, pt to continue medical treatment norvasc 5 qd

## 2023-05-07 NOTE — Assessment & Plan Note (Addendum)
Lab Results  Component Value Date   LDLCALC 70 05/06/2023   Uncontrolled, goal ldl < 70, pt to continue current statin crestor 40 every day, zetia 10 every day and lower chol diet, declines add repatha

## 2023-05-07 NOTE — Assessment & Plan Note (Signed)
Lab Results  Component Value Date   HGBA1C 6.1 05/06/2023   Stable, pt to continue current medical treatment  - diet  wt control

## 2023-05-07 NOTE — Assessment & Plan Note (Signed)
Last vitamin D Lab Results  Component Value Date   VD25OH 42.01 05/06/2023   Stable, cont oral replacement

## 2023-05-26 ENCOUNTER — Other Ambulatory Visit: Payer: Self-pay | Admitting: Internal Medicine

## 2023-05-26 DIAGNOSIS — R058 Other specified cough: Secondary | ICD-10-CM

## 2023-06-02 ENCOUNTER — Encounter: Payer: Self-pay | Admitting: Cardiovascular Disease

## 2023-06-02 NOTE — Progress Notes (Signed)
Cardiology Office Note:    Date:  06/03/2023   ID:  Christopher Mcfarland, DOB September 09, 1946, MRN [id number]  PCP:  Corwin Levins, MD   [facility] Providers Cardiologist:  Marselino Slayton   Referring MD: Corwin Levins, MD   Chief Complaint  Patient presents with   Hypertension        Coronary Artery Disease         History of Present Illness:    Christopher Mcfarland is a 77 y.o. male with a hx of HLD,  HTN  Has had HLD for years . Has been on rosuvastatin 10 mg a day for many years.  When his coronary calcium score returned elevated his rosuvastatin was increased to 40 mg a day and Zetia 10 mg a day was added.  No CP or dyspnea  Exercises regularly , Silver Sneakers 3-4 days a week  Has had a viral URI which has slowed his work outs some   He has not had any episodes of chest pain, chest pressure or fullness with his workouts.  No syncope   Fam Hx:  brother has some cardiac issues  Coronary artery calcium score shows a total score of 5942 which places him in the 99th percentile for age and gender matched controls.  His LDL goal is 55.    Tries to follow a low cholesterol diet   Las LDL ws 110 ,  has a follow up lipid profile in March    Sept. 20, 2024 Christopher Mcfarland is seen for follow up of his HTN, HLD , CAC No CP or dyspnea   Lipids from Aug. 2024 were reviewed  LDL is 70        Past Medical History:  Diagnosis Date   Essential hypertension    Gout 01/02/2020   Gout crystals on joint fluid April 2021   Hepatitis C 2003   Treated   Hyperlipidemia     Past Surgical History:  Procedure Laterality Date   CARPAL TUNNEL RELEASE     bilat 1999   HEMORRHOID SURGERY     1999/4 polyps removed rectum   TRIGGER FINGER RELEASE     right hand 1999    Current Medications: Current Meds  Medication Sig   allopurinol (ZYLOPRIM) 300 MG tablet TAKE 1 TABLET(300 MG) BY MOUTH DAILY   amLODipine (NORVASC) 5 MG tablet TAKE 1 TABLET(5 MG) BY MOUTH DAILY   aspirin EC 81  MG tablet Take 1 tablet (81 mg total) by mouth daily. Swallow whole.   Cholecalciferol 50 MCG (2000 UT) TABS 1 tab by mouth once daily   diclofenac Sodium (VOLTAREN) 1 % GEL Apply 4 g topically 4 (four) times daily.   ezetimibe (ZETIA) 10 MG tablet Take 1 tablet (10 mg total) by mouth daily.   guaiFENesin-codeine 100-10 MG/5ML syrup TAKE 10 ML BY MOUTH EVERY 6 HOURS AS NEEDED FOR COUGH   ipratropium (ATROVENT) 0.06 % nasal spray Place 2 sprays into both nostrils 3 (three) times daily.   mupirocin ointment (BACTROBAN) 2 % APPLY WITHIN BOTH NASAL PASSAGES TWICE DAILY FOR 2 WEEKS   rosuvastatin (CRESTOR) 40 MG tablet Take 1 tablet (40 mg total) by mouth daily.   tadalafil (CIALIS) 20 MG tablet Take 0.5-1 tablets (10-20 mg total) by mouth every other day as needed for erectile dysfunction.     Allergies:   Patient has no known allergies.   Social History   Socioeconomic History   Marital status: Married    Spouse name:  Not on file   Number of children: 4   Years of education: 12   Highest education level: Not on file  Occupational History   Occupation: Education administrator  Tobacco Use   Smoking status: Never   Smokeless tobacco: Never  Vaping Use   Vaping status: Never Used  Substance and Sexual Activity   Alcohol use: Not Currently    Alcohol/week: 2.0 - 3.0 standard drinks of alcohol    Types: 2 - 3 Shots of liquor per week   Drug use: No    Comment: smoked marijuana "years ago"   Sexual activity: Yes  Other Topics Concern   Not on file  Social History Narrative   Fun: Play pool, yard work, gym, and Curator   Social Determinants of Health   Financial Resource Strain: Low Risk  (04/17/2021)   Overall Financial Resource Strain (CARDIA)    Difficulty of Paying Living Expenses: Not hard at all  Food Insecurity: No Food Insecurity (05/22/2020)   Hunger Vital Sign    Worried About Running Out of Food in the Last Year: Never true    Ran Out of Food in the Last Year: Never true  Transportation  Needs: No Transportation Needs (04/17/2021)   PRAPARE - Administrator, Civil Service (Medical): No    Lack of Transportation (Non-Medical): No  Physical Activity: Sufficiently Active (04/17/2021)   Exercise Vital Sign    Days of Exercise per Week: 5 days    Minutes of Exercise per Session: 30 min  Stress: No Stress Concern Present (04/17/2021)   [employer] of Occupational Health - Occupational Stress Questionnaire    Feeling of Stress : Not at all  Social Connections: Socially Integrated (04/17/2021)   Social Connection and Isolation Panel [NHANES]    Frequency of Communication with Friends and Family: More than three times a week    Frequency of Social Gatherings with Friends and Family: More than three times a week    Attends Religious Services: More than 4 times per year    Active Member of [employer] or Organizations: No    Attends Engineer, structural: More than 4 times per year    Marital Status: Living with partner     Family History: The patient's family history includes Bone cancer in his father; CAD (age of onset: 37) in his brother; Kidney cancer in his mother. There is no history of Colon cancer, Pancreatic cancer, Rectal cancer, or Stomach cancer.  ROS:   Please see the history of present illness.     All other systems reviewed and are negative.  EKGs/Labs/Other Studies Reviewed:    The following studies were reviewed today:   EKG:      Recent Labs: 11/24/2022: Hemoglobin 13.6; Platelets 181.0; TSH 1.43 05/06/2023: ALT 38; BUN 17; Creatinine, Ser 1.09; Potassium 4.3; Sodium 142  Recent Lipid Panel    Component Value Date/Time   CHOL 119 05/06/2023 1009   TRIG 59.0 05/06/2023 1009   HDL 37.10 (L) 05/06/2023 1009   CHOLHDL 3 05/06/2023 1009   VLDL 11.8 05/06/2023 1009   LDLCALC 70 05/06/2023 1009   LDLCALC 91 03/07/2020 1634   LDLDIRECT 78.0 10/19/2021 1100     Risk Assessment/Calculations:      Physical Exam: Blood pressure 112/80,  pulse (!) 102, height 5\' 8"  (1.727 m), weight 213 lb 12.8 oz (97 kg), SpO2 100%.       GEN:  Well nourished, well developed in no acute distress HEENT: Normal NECK: No  JVD; No carotid bruits LYMPHATICS: No lymphadenopathy CARDIAC: RRR , no murmurs, rubs, gallops RESPIRATORY:  Clear to auscultation without rales, wheezing or rhonchi  ABDOMEN: Soft, non-tender, non-distended MUSCULOSKELETAL:  No edema; No deformity  SKIN: Warm and dry NEUROLOGIC:  Alert and oriented x 3   Physical Exam:       ASSESSMENT:    1. Coronary artery calcification   2. Essential hypertension     PLAN:       Coronary artery calcifications: Christopher Mcfarland  presents with a coronary calcium score of 5942.   No symptoms , no CP  Will continue to follow   2.  Hypertension: BP is well controlled.   Will see him in a year             Medication Adjustments/Labs and Tests Ordered: Current medicines are reviewed at length with the patient today.  Concerns regarding medicines are outlined above.  No orders of the defined types were placed in this encounter.  No orders of the defined types were placed in this encounter.   There are no Patient Instructions on file for this visit.   Signed, Kristeen Miss, MD  06/03/2023 8:22 AM    [facility]

## 2023-06-03 ENCOUNTER — Ambulatory Visit: Payer: Medicare HMO | Attending: Cardiovascular Disease | Admitting: Cardiovascular Disease

## 2023-06-03 ENCOUNTER — Encounter: Payer: Self-pay | Admitting: Cardiovascular Disease

## 2023-06-03 VITALS — BP 112/80 | HR 102 | Ht 68.0 in | Wt 213.8 lb

## 2023-06-03 DIAGNOSIS — E782 Mixed hyperlipidemia: Secondary | ICD-10-CM | POA: Diagnosis not present

## 2023-06-03 DIAGNOSIS — I2584 Coronary atherosclerosis due to calcified coronary lesion: Secondary | ICD-10-CM | POA: Diagnosis not present

## 2023-06-03 DIAGNOSIS — I10 Essential (primary) hypertension: Secondary | ICD-10-CM

## 2023-06-03 DIAGNOSIS — I2510 Atherosclerotic heart disease of native coronary artery without angina pectoris: Secondary | ICD-10-CM

## 2023-06-03 NOTE — Patient Instructions (Signed)
Medication Instructions:  Your physician recommends that you continue on your current medications as directed. Please refer to the Current Medication list given to you today.  *If you need a refill on your cardiac medications before your next appointment, please call your pharmacy*   Lab Work: NONE If you have labs (blood work) drawn today and your tests are completely normal, you will receive your results only by: MyChart Message (if you have MyChart) OR A paper copy in the mail If you have any lab test that is abnormal or we need to change your treatment, we will call you to review the results.   Testing/Procedures: NONE   Follow-Up: At [facility], you and your health needs are our priority.  As part of our continuing mission to provide you with exceptional heart care, we have created designated Provider Care Teams.  These Care Teams include your primary Cardiologist (physician) and Advanced Practice Providers (APPs -  Physician Assistants and Nurse Practitioners) who all work together to provide you with the care you need, when you need it.  We recommend signing up for the patient portal called "MyChart".  Sign up information is provided on this After Visit Summary.  MyChart is used to connect with patients for Virtual Visits (Telemedicine).  Patients are able to view lab/test results, encounter notes, upcoming appointments, etc.  Non-urgent messages can be sent to your provider as well.   To learn more about what you can do with MyChart, go to [email or web address].    Your next appointment:   1 year(s)  Provider:   Philip Nahser, MD      

## 2023-06-06 ENCOUNTER — Other Ambulatory Visit: Payer: Self-pay | Admitting: Internal Medicine

## 2023-06-08 ENCOUNTER — Other Ambulatory Visit: Payer: Self-pay | Admitting: Internal Medicine

## 2023-06-08 MED ORDER — PROMETHAZINE-DM 6.25-15 MG/5ML PO SYRP: 5.0000 mL | ORAL_SOLUTION | Freq: Four times a day (QID) | ORAL | 0 refills | Status: DC | PRN

## 2023-07-08 ENCOUNTER — Other Ambulatory Visit: Payer: Self-pay

## 2023-07-08 MED ORDER — ALLOPURINOL 300 MG PO TABS: ORAL_TABLET | ORAL | 2 refills | Status: DC

## 2023-08-02 ENCOUNTER — Ambulatory Visit (INDEPENDENT_AMBULATORY_CARE_PROVIDER_SITE_OTHER): Payer: Medicare HMO

## 2023-08-02 DIAGNOSIS — Z0000 Encounter for general adult medical examination without abnormal findings: Secondary | ICD-10-CM | POA: Diagnosis not present

## 2023-08-02 NOTE — Patient Instructions (Signed)
Christopher Mcfarland , Thank you for taking time to come for your Medicare Wellness Visit. I appreciate your ongoing commitment to your health goals. Please review the following plan we discussed and let me know if I can assist you in the future.   Screening recommendations/referrals: Colonoscopy: up to date Recommended yearly ophthalmology/optometry visit for glaucoma screening and checkup Recommended yearly dental visit for hygiene and checkup  Vaccinations: Influenza vaccine: Education provided Pneumococcal vaccine: up to date Tdap vaccine: up to date Shingles vaccine: up to date    Advanced directives: Education provided   Preventive Care 65 Years and Older, Male Preventive care refers to lifestyle choices and visits with your health care provider that can promote health and wellness. What does preventive care include? A yearly physical exam. This is also called an annual well check. Dental exams once or twice a year. Routine eye exams. Ask your health care provider how often you should have your eyes checked. Personal lifestyle choices, including: Daily care of your teeth and gums. Regular physical activity. Eating a healthy diet. Avoiding tobacco and drug use. Limiting alcohol use. Practicing safe sex. Taking low doses of aspirin every day. Taking vitamin and mineral supplements as recommended by your health care provider. What happens during an annual well check? The services and screenings done by your health care provider during your annual well check will depend on your age, overall health, lifestyle risk factors, and family history of disease. Counseling  Your health care provider may ask you questions about your: Alcohol use. Tobacco use. Drug use. Emotional well-being. Home and relationship well-being. Sexual activity. Eating habits. History of falls. Memory and ability to understand (cognition). Work and work Astronomer. Screening  You may have the following  tests or measurements: Height, weight, and BMI. Blood pressure. Lipid and cholesterol levels. These may be checked every 5 years, or more frequently if you are over 69 years old. Skin check. Lung cancer screening. You may have this screening every year starting at age 27 if you have a 30-pack-year history of smoking and currently smoke or have quit within the past 15 years. Fecal occult blood test (FOBT) of the stool. You may have this test every year starting at age 46. Flexible sigmoidoscopy or colonoscopy. You may have a sigmoidoscopy every 5 years or a colonoscopy every 10 years starting at age 48. Prostate cancer screening. Recommendations will vary depending on your family history and other risks. Hepatitis C blood test. Hepatitis B blood test. Sexually transmitted disease (STD) testing. Diabetes screening. This is done by checking your blood sugar (glucose) after you have not eaten for a while (fasting). You may have this done every 1-3 years. Abdominal aortic aneurysm (AAA) screening. You may need this if you are a current or former smoker. Osteoporosis. You may be screened starting at age 34 if you are at high risk. Talk with your health care provider about your test results, treatment options, and if necessary, the need for more tests. Vaccines  Your health care provider may recommend certain vaccines, such as: Influenza vaccine. This is recommended every year. Tetanus, diphtheria, and acellular pertussis (Tdap, Td) vaccine. You may need a Td booster every 10 years. Zoster vaccine. You may need this after age 81. Pneumococcal 13-valent conjugate (PCV13) vaccine. One dose is recommended after age 32. Pneumococcal polysaccharide (PPSV23) vaccine. One dose is recommended after age 84. Talk to your health care provider about which screenings and vaccines you need and how often you need them. This information  is not intended to replace advice given to you by your health care provider.  Make sure you discuss any questions you have with your health care provider. Document Released: 09/26/2015 Document Revised: 05/19/2016 Document Reviewed: 07/01/2015 Elsevier Interactive Patient Education  2017 [employer].  Fall Prevention in the Home Falls can cause injuries. They can happen to people of all ages. There are many things you can do to make your home safe and to help prevent falls. What can I do on the outside of my home? Regularly fix the edges of walkways and driveways and fix any cracks. Remove anything that might make you trip as you walk through a door, such as a raised step or threshold. Trim any bushes or trees on the path to your home. Use bright outdoor lighting. Clear any walking paths of anything that might make someone trip, such as rocks or tools. Regularly check to see if handrails are loose or broken. Make sure that both sides of any steps have handrails. Any raised decks and porches should have guardrails on the edges. Have any leaves, snow, or ice cleared regularly. Use sand or salt on walking paths during winter. Clean up any spills in your garage right away. This includes oil or grease spills. What can I do in the bathroom? Use night lights. Install grab bars by the toilet and in the tub and shower. Do not use towel bars as grab bars. Use non-skid mats or decals in the tub or shower. If you need to sit down in the shower, use a plastic, non-slip stool. Keep the floor dry. Clean up any water that spills on the floor as soon as it happens. Remove soap buildup in the tub or shower regularly. Attach bath mats securely with double-sided non-slip rug tape. Do not have throw rugs and other things on the floor that can make you trip. What can I do in the bedroom? Use night lights. Make sure that you have a light by your bed that is easy to reach. Do not use any sheets or blankets that are too big for your bed. They should not hang down onto the floor. Have a  firm chair that has side arms. You can use this for support while you get dressed. Do not have throw rugs and other things on the floor that can make you trip. What can I do in the kitchen? Clean up any spills right away. Avoid walking on wet floors. Keep items that you use a lot in easy-to-reach places. If you need to reach something above you, use a strong step stool that has a grab bar. Keep electrical cords out of the way. Do not use floor polish or wax that makes floors slippery. If you must use wax, use non-skid floor wax. Do not have throw rugs and other things on the floor that can make you trip. What can I do with my stairs? Do not leave any items on the stairs. Make sure that there are handrails on both sides of the stairs and use them. Fix handrails that are broken or loose. Make sure that handrails are as long as the stairways. Check any carpeting to make sure that it is firmly attached to the stairs. Fix any carpet that is loose or worn. Avoid having throw rugs at the top or bottom of the stairs. If you do have throw rugs, attach them to the floor with carpet tape. Make sure that you have a light switch at the top of  the stairs and the bottom of the stairs. If you do not have them, ask someone to add them for you. What else can I do to help prevent falls? Wear shoes that: Do not have high heels. Have rubber bottoms. Are comfortable and fit you well. Are closed at the toe. Do not wear sandals. If you use a stepladder: Make sure that it is fully opened. Do not climb a closed stepladder. Make sure that both sides of the stepladder are locked into place. Ask someone to hold it for you, if possible. Clearly mark and make sure that you can see: Any grab bars or handrails. First and last steps. Where the edge of each step is. Use tools that help you move around (mobility aids) if they are needed. These include: Canes. Walkers. Scooters. Crutches. Turn on the lights when you  go into a dark area. Replace any light bulbs as soon as they burn out. Set up your furniture so you have a clear path. Avoid moving your furniture around. If any of your floors are uneven, fix them. If there are any pets around you, be aware of where they are. Review your medicines with your doctor. Some medicines can make you feel dizzy. This can increase your chance of falling. Ask your doctor what other things that you can do to help prevent falls. This information is not intended to replace advice given to you by your health care provider. Make sure you discuss any questions you have with your health care provider. Document Released: 06/26/2009 Document Revised: 02/05/2016 Document Reviewed: 10/04/2014 Elsevier Interactive Patient Education  2017 [employer].

## 2023-08-02 NOTE — Progress Notes (Signed)
Subjective:   Christopher Mcfarland is a 77 y.o. male who presents for Medicare Annual/Subsequent preventive examination.  Visit Complete: Virtual I connected with  [name] on 08/02/23 by a audio enabled telemedicine application and verified that I am speaking with the correct person using two identifiers.  Patient Location: Home  Provider Location: Home Office  I discussed the limitations of evaluation and management by telemedicine. The patient expressed understanding and agreed to proceed.  Vital Signs: Because this visit was a virtual/telehealth visit, some criteria may be missing or patient reported. Any vitals not documented were not able to be obtained and vitals that have been documented are patient reported.   Cardiac Risk Factors include: advanced age (>48men, >97 women);male gender;hypertension;obesity (BMI >30kg/m2)     Objective:    There were no vitals filed for this visit. There is no height or weight on file to calculate BMI.     08/02/2023   10:26 AM 04/17/2021    3:05 PM 03/21/2020   12:56 PM 12/22/2019    7:44 AM 07/28/2019    2:04 PM 03/07/2019    4:39 PM 07/30/2018    7:47 AM  Advanced Directives  Does Patient Have a Medical Advance Directive? No No No No No No No  Would patient like information on creating a medical advance directive? No - Patient declined No - Patient declined No - Patient declined  No - Patient declined Yes (MAU/Ambulatory/Procedural Areas - Information given) No - Patient declined    Current Medications (verified) Outpatient Encounter Medications as of 08/02/2023  Medication Sig   allopurinol (ZYLOPRIM) 300 MG tablet TAKE 1 TABLET(300 MG) BY MOUTH DAILY   amLODipine (NORVASC) 5 MG tablet TAKE 1 TABLET(5 MG) BY MOUTH DAILY   aspirin EC 81 MG tablet Take 1 tablet (81 mg total) by mouth daily. Swallow whole.   Cholecalciferol 50 MCG (2000 UT) TABS 1 tab by mouth once daily   diclofenac Sodium (VOLTAREN) 1 % GEL Apply 4 g topically  4 (four) times daily.   ezetimibe (ZETIA) 10 MG tablet Take 1 tablet (10 mg total) by mouth daily.   ipratropium (ATROVENT) 0.06 % nasal spray Place 2 sprays into both nostrils 3 (three) times daily.   promethazine-dextromethorphan (PROMETHAZINE-DM) 6.25-15 MG/5ML syrup Take 5 mLs by mouth 4 (four) times daily as needed for cough. (Patient not taking: Reported on 08/02/2023)   rosuvastatin (CRESTOR) 40 MG tablet Take 1 tablet (40 mg total) by mouth daily.   tadalafil (CIALIS) 20 MG tablet Take 0.5-1 tablets (10-20 mg total) by mouth every other day as needed for erectile dysfunction.   mupirocin ointment (BACTROBAN) 2 % APPLY WITHIN BOTH NASAL PASSAGES TWICE DAILY FOR 2 WEEKS (Patient not taking: Reported on 08/02/2023)   No facility-administered encounter medications on file as of 08/02/2023.    Allergies (verified) Patient has no known allergies.   History: Past Medical History:  Diagnosis Date   Essential hypertension    Gout 01/02/2020   Gout crystals on joint fluid April 2021   Hepatitis C 2003   Treated   Hyperlipidemia    Past Surgical History:  Procedure Laterality Date   CARPAL TUNNEL RELEASE     bilat 1999   HEMORRHOID SURGERY     1999/4 polyps removed rectum   TRIGGER FINGER RELEASE     right hand 1999   Family History  Problem Relation Age of Onset   CAD Brother 16   Kidney cancer Mother    Bone cancer  Father    Colon cancer Neg Hx    Pancreatic cancer Neg Hx    Rectal cancer Neg Hx    Stomach cancer Neg Hx    Social History   Socioeconomic History   Marital status: Married    Spouse name: Not on file   Number of children: 4   Years of education: 12   Highest education level: Not on file  Occupational History   Occupation: Education administrator  Tobacco Use   Smoking status: Never   Smokeless tobacco: Never  Vaping Use   Vaping status: Never Used  Substance and Sexual Activity   Alcohol use: Not Currently    Alcohol/week: 2.0 - 3.0 standard drinks of alcohol     Types: 2 - 3 Shots of liquor per week   Drug use: No    Comment: smoked marijuana "years ago"   Sexual activity: Yes  Other Topics Concern   Not on file  Social History Narrative   Fun: Play pool, yard work, gym, and Curator   Social Determinants of Health   Financial Resource Strain: Low Risk  (08/02/2023)   Overall Financial Resource Strain (CARDIA)    Difficulty of Paying Living Expenses: Not hard at all  Food Insecurity: No Food Insecurity (08/02/2023)   Hunger Vital Sign    Worried About Running Out of Food in the Last Year: Never true    Ran Out of Food in the Last Year: Never true  Transportation Needs: No Transportation Needs (08/02/2023)   PRAPARE - Administrator, Civil Service (Medical): No    Lack of Transportation (Non-Medical): No  Physical Activity: Sufficiently Active (08/02/2023)   Exercise Vital Sign    Days of Exercise per Week: 4 days    Minutes of Exercise per Session: 50 min  Stress: No Stress Concern Present (08/02/2023)   [employer] of Occupational Health - Occupational Stress Questionnaire    Feeling of Stress : Not at all  Social Connections: Moderately Isolated (08/02/2023)   Social Connection and Isolation Panel [NHANES]    Frequency of Communication with Friends and Family: More than three times a week    Frequency of Social Gatherings with Friends and Family: More than three times a week    Attends Religious Services: More than 4 times per year    Active Member of [employer] or Organizations: No    Attends Engineer, structural: Never    Marital Status: Divorced    Tobacco Counseling Counseling given: Not Answered   Clinical Intake:  Pre-visit preparation completed: Yes  Pain : No/denies pain     Diabetes: No  How often do you need to have someone help you when you read instructions, pamphlets, or other written materials from your doctor or pharmacy?: 1 - Never  Interpreter Needed?: No  Information entered  by :: Remi Haggard LPN   Activities of Daily Living    08/02/2023   10:38 AM  In your present state of health, do you have any difficulty performing the following activities:  Hearing? 1  Vision? 0  Difficulty concentrating or making decisions? 0  Walking or climbing stairs? 0  Dressing or bathing? 0  Doing errands, shopping? 0  Preparing Food and eating ? N  Using the Toilet? N  In the past six months, have you accidently leaked urine? N  Do you have problems with loss of bowel control? N  Managing your Medications? N  Managing your Finances? N  Housekeeping or managing your  Housekeeping? N    Patient Care Team: Corwin Levins, MD as PCP - General (Internal Medicine) Nahser, Deloris Ping, MD as PCP - Cardiology (Cardiology) Myrtie Neither Andreas Blower, MD as Consulting Physician (Gastroenterology) Comer, Belia Heman, MD as Consulting Physician (Infectious Diseases) Christia Reading, MD as Consulting Physician (Otolaryngology)  Indicate any recent Medical Services you may have received from other than [facility] providers in the past year (date may be approximate).     Assessment:   This is a routine wellness examination for [place].  Hearing/Vision screen Hearing Screening - Comments:: Bilateral hearing aids Vision Screening - Comments:: [facility] Up to date   Goals Addressed             This Visit's Progress    Weight (lb) < 200 lb (90.7 kg)         Depression Screen    08/02/2023   10:30 AM 05/06/2023    9:05 AM 11/24/2022    9:27 AM 08/25/2022    9:24 AM 04/19/2022    4:19 PM 02/23/2022    8:21 AM 04/17/2021    3:20 PM  PHQ 2/9 Scores  PHQ - 2 Score 0 0 0 0 0 0 0  PHQ- 9 Score 2   0 0 0     Fall Risk    08/02/2023   10:25 AM 05/06/2023    9:05 AM 11/24/2022    9:27 AM 08/25/2022    9:24 AM 04/19/2022    4:19 PM  Fall Risk   Falls in the past year? 0 0 0 0 0  Number falls in past yr: 0 0 0  0  Injury with Fall? 0 0 0 0 1  Risk for fall due to :  No Fall Risks  No Fall Risks  No Fall Risks  Follow up Falls evaluation completed;Education provided;Falls prevention discussed Falls evaluation completed  Falls evaluation completed Education provided    MEDICARE RISK AT HOME: Medicare Risk at Home Any stairs in or around the home?: No If so, are there any without handrails?: No Home free of loose throw rugs in walkways, pet beds, electrical cords, etc?: Yes Adequate lighting in your home to reduce risk of falls?: Yes Life alert?: No Use of a cane, walker or w/c?: No Grab bars in the bathroom?: No Shower chair or bench in shower?: No Elevated toilet seat or a handicapped toilet?: No  TIMED UP AND GO:  Was the test performed?  No    Cognitive Function:        08/02/2023   10:26 AM 03/21/2020   12:58 PM  6CIT Screen  What Year? 0 points 0 points  What month? 0 points 0 points  What time? 0 points 0 points  Count back from 20 0 points 0 points  Months in reverse 4 points 0 points  Repeat phrase 4 points 0 points  Total Score 8 points 0 points    Immunizations Immunization History  Administered Date(s) Administered   Fluad Quad(high Dose 65+) 06/27/2019, 06/05/2021, 08/25/2022   Influenza Whole 08/15/2009   Influenza, High Dose Seasonal PF 07/11/2018   Influenza,inj,Quad PF,6+ Mos 08/27/2015   Moderna Sars-Covid-2 Vaccination 01/29/2021   PFIZER(Purple Top)SARS-COV-2 Vaccination 11/05/2019, 11/26/2019   Pneumococcal Conjugate-13 12/12/2017   Pneumococcal Polysaccharide-23 03/07/2019   Td 10/21/2009   Tdap 03/07/2020   Unspecified SARS-COV-2 Vaccination 10/15/2019, 11/08/2019   Zoster Recombinant(Shingrix) 03/06/2018, 06/05/2021   Zoster, Live 01/18/2016    TDAP status: Up to date  Flu Vaccine  status: Due, Education has been provided regarding the importance of this vaccine. Advised may receive this vaccine at local pharmacy or Health Dept. Aware to provide a copy of the vaccination record if obtained from local pharmacy or Health Dept.  Verbalized acceptance and understanding.  Pneumococcal vaccine status: Up to date  Covid-19 vaccine status: Declined, Education has been provided regarding the importance of this vaccine but patient still declined. Advised may receive this vaccine at local pharmacy or Health Dept.or vaccine clinic. Aware to provide a copy of the vaccination record if obtained from local pharmacy or Health Dept. Verbalized acceptance and understanding.  Qualifies for Shingles Vaccine? No   Zostavax completed Yes   Shingrix Completed?: Yes  Screening Tests Health Maintenance  Topic Date Due   INFLUENZA VACCINE  04/14/2023   Colonoscopy  06/12/2024   Medicare Annual Wellness (AWV)  08/01/2024   DTaP/Tdap/Td (3 - Td or Tdap) 03/07/2030   Pneumonia Vaccine 37+ Years old  Completed   Hepatitis C Screening  Completed   Zoster Vaccines- Shingrix  Completed   HPV VACCINES  Aged Out   COVID-19 Vaccine  Discontinued    Health Maintenance  Health Maintenance Due  Topic Date Due   INFLUENZA VACCINE  04/14/2023    Colorectal cancer screening: Type of screening: Colonoscopy. Completed 2015. Repeat every 10 years  Lung Cancer Screening: (Low Dose CT Chest recommended if Age 37-80 years, 20 pack-year currently smoking OR have quit w/in 15years.) does not qualify.   Lung Cancer Screening Referral:   Additional Screening:  Hepatitis C Screening: does not qualify; Completed 2017  Vision Screening: Recommended annual ophthalmology exams for early detection of glaucoma and other disorders of the eye. Is the patient up to date with their annual eye exam?  Yes  Who is the provider or what is the name of the office in which the patient attends annual eye exams? [facility] If pt is not established with a provider, would they like to be referred to a provider to establish care? No .   Dental Screening: Recommended annual dental exams for proper oral hygiene   Community Resource Referral / Chronic Care  Management: CRR required this visit?  No   CCM required this visit?  No     Plan:     I have personally reviewed and noted the following in the patient's chart:   Medical and social history Use of alcohol, tobacco or illicit drugs  Current medications and supplements including opioid prescriptions. Patient is not currently taking opioid prescriptions. Functional ability and status Nutritional status Physical activity Advanced directives List of other physicians Hospitalizations, surgeries, and ER visits in previous 12 months Vitals Screenings to include cognitive, depression, and falls Referrals and appointments  In addition, I have reviewed and discussed with patient certain preventive protocols, quality metrics, and best practice recommendations. A written personalized care plan for preventive services as well as general preventive health recommendations were provided to patient.     Remi Haggard, LPN   16/06/9603   After Visit Summary: (MyChart) Due to this being a telephonic visit, the after visit summary with patients personalized plan was offered to patient via MyChart   Nurse Notes:

## 2023-08-03 ENCOUNTER — Ambulatory Visit: Payer: Medicare HMO

## 2023-08-03 DIAGNOSIS — Z23 Encounter for immunization: Secondary | ICD-10-CM | POA: Diagnosis not present

## 2023-08-03 NOTE — Progress Notes (Unsigned)
   Rubin Payor, PhD, LAT, ATC acting as a scribe for Clementeen Graham, MD.  Christopher Mcfarland is a 77 y.o. male who presents to [facility] today for low back pain. Pt was last seen by Dr. Denyse Amass in 2023 for lumbar radiculopathy and R SI joint pain. Last lumbar ESI was on 10/21/21.  Today, pt c/o LBP x ***. Pt locates pain to ***  Radiating pain: LE numbness/tingling: LE weakness: Aggravates: Treatments tried:  Dx imaging: 08/24/19 L-spine MRI  07/28/19 L-spine XR  Pertinent review of systems: ***  Relevant historical information: ***   Exam:  There were no vitals taken for this visit. General: Well Developed, well nourished, and in no acute distress.   MSK: ***    Lab and Radiology Results No results found for this or any previous visit (from the past 72 hour(s)). No results found.     Assessment and Plan: 77 y.o. male with ***   PDMP not reviewed this encounter. No orders of the defined types were placed in this encounter.  No orders of the defined types were placed in this encounter.    Discussed warning signs or symptoms. Please see discharge instructions. Patient expresses understanding.   ***

## 2023-08-03 NOTE — Progress Notes (Signed)
After obtaining consent, and per orders of Dr. Jonny Ruiz, injection of HDF given by Ferdie Ping. Patient instructed to report any adverse reaction to me immediately.

## 2023-08-04 ENCOUNTER — Ambulatory Visit (INDEPENDENT_AMBULATORY_CARE_PROVIDER_SITE_OTHER): Payer: Medicare HMO

## 2023-08-04 ENCOUNTER — Ambulatory Visit: Payer: Medicare HMO | Admitting: Family Medicine

## 2023-08-04 VITALS — BP 130/80 | HR 101 | Ht 68.0 in

## 2023-08-04 DIAGNOSIS — M47816 Spondylosis without myelopathy or radiculopathy, lumbar region: Secondary | ICD-10-CM | POA: Diagnosis not present

## 2023-08-04 DIAGNOSIS — R29898 Other symptoms and signs involving the musculoskeletal system: Secondary | ICD-10-CM | POA: Diagnosis not present

## 2023-08-04 DIAGNOSIS — M545 Low back pain, unspecified: Secondary | ICD-10-CM | POA: Diagnosis not present

## 2023-08-04 DIAGNOSIS — M5416 Radiculopathy, lumbar region: Secondary | ICD-10-CM

## 2023-08-04 MED ORDER — PREDNISONE 50 MG PO TABS: 50.0000 mg | ORAL_TABLET | Freq: Every day | ORAL | 0 refills | Status: DC

## 2023-08-04 MED ORDER — GABAPENTIN 300 MG PO CAPS: 300.0000 mg | ORAL_CAPSULE | Freq: Three times a day (TID) | ORAL | 3 refills | Status: DC | PRN

## 2023-08-04 NOTE — Patient Instructions (Signed)
Good to see you Get x ray on the way out Epidural ordered for call [phone] to schedule We will be in touch after the imaging and epidural

## 2023-08-15 ENCOUNTER — Encounter: Payer: Self-pay | Admitting: Family Medicine

## 2023-08-15 NOTE — Discharge Instructions (Signed)

## 2023-08-16 ENCOUNTER — Ambulatory Visit
Admission: RE | Admit: 2023-08-16 | Discharge: 2023-08-16 | Disposition: A | Discharge status: Home / Self Care | Payer: Medicare HMO | Source: Ambulatory Visit | Attending: Family Medicine | Admitting: Family Medicine

## 2023-08-16 DIAGNOSIS — M5417 Radiculopathy, lumbosacral region: Secondary | ICD-10-CM | POA: Diagnosis not present

## 2023-08-16 DIAGNOSIS — R29898 Other symptoms and signs involving the musculoskeletal system: Secondary | ICD-10-CM

## 2023-08-16 DIAGNOSIS — M5416 Radiculopathy, lumbar region: Secondary | ICD-10-CM

## 2023-08-16 MED ORDER — METHYLPREDNISOLONE ACETATE 40 MG/ML INJ SUSP (RADIOLOG
80.0000 mg | Freq: Once | INTRAMUSCULAR | Status: AC
  Administered 2023-08-16: 80 mg via EPIDURAL

## 2023-08-16 MED ORDER — IOPAMIDOL (ISOVUE-M 200) INJECTION 41%
1.0000 mL | Freq: Once | INTRAMUSCULAR | Status: AC
  Administered 2023-08-16: 1 mL via EPIDURAL

## 2023-08-22 NOTE — Progress Notes (Signed)
Low back x-ray shows medium arthritis changes worse at L4-5.

## 2023-08-23 DIAGNOSIS — H2513 Age-related nuclear cataract, bilateral: Secondary | ICD-10-CM | POA: Diagnosis not present

## 2023-08-23 DIAGNOSIS — Z0100 Encounter for examination of eyes and vision without abnormal findings: Secondary | ICD-10-CM | POA: Diagnosis not present

## 2023-11-07 ENCOUNTER — Ambulatory Visit (INDEPENDENT_AMBULATORY_CARE_PROVIDER_SITE_OTHER): Payer: Medicare Other | Admitting: Internal Medicine

## 2023-11-07 ENCOUNTER — Encounter: Payer: Self-pay | Admitting: Internal Medicine

## 2023-11-07 VITALS — BP 120/72 | HR 97 | Temp 98.0°F | Ht 68.0 in | Wt 216.0 lb

## 2023-11-07 DIAGNOSIS — R739 Hyperglycemia, unspecified: Secondary | ICD-10-CM

## 2023-11-07 DIAGNOSIS — E538 Deficiency of other specified B group vitamins: Secondary | ICD-10-CM

## 2023-11-07 DIAGNOSIS — E782 Mixed hyperlipidemia: Secondary | ICD-10-CM | POA: Diagnosis not present

## 2023-11-07 DIAGNOSIS — N320 Bladder-neck obstruction: Secondary | ICD-10-CM

## 2023-11-07 DIAGNOSIS — I10 Essential (primary) hypertension: Secondary | ICD-10-CM

## 2023-11-07 DIAGNOSIS — E559 Vitamin D deficiency, unspecified: Secondary | ICD-10-CM

## 2023-11-07 DIAGNOSIS — Z0001 Encounter for general adult medical examination with abnormal findings: Secondary | ICD-10-CM

## 2023-11-07 MED ORDER — ROSUVASTATIN CALCIUM 40 MG PO TABS: 40.0000 mg | ORAL_TABLET | Freq: Every day | ORAL | 3 refills | Status: DC

## 2023-11-07 MED ORDER — AMLODIPINE BESYLATE 5 MG PO TABS: ORAL_TABLET | ORAL | 3 refills | Status: DC

## 2023-11-07 MED ORDER — EZETIMIBE 10 MG PO TABS: 10.0000 mg | ORAL_TABLET | Freq: Every day | ORAL | 3 refills | Status: DC

## 2023-11-07 MED ORDER — ALLOPURINOL 300 MG PO TABS: ORAL_TABLET | ORAL | 3 refills | Status: DC

## 2023-11-07 NOTE — Patient Instructions (Addendum)
 [email or web address] [address], [place], [place] [address]  5.6 mi [phone]  Please continue all other medications as before, and refills have been done if requested.  Please have the pharmacy call with any other refills you may need.  Please continue your efforts at being more active, low cholesterol diet, and weight control.  You are otherwise up to date with prevention measures today.  Please keep your appointments with your specialists as you may have planned  We can hold on lab testing today  Please make an Appointment to return in 6 months, or sooner if needed, also with Lab Appointment for testing done 3-5 days before at the FIRST FLOOR Lab (so this is for TWO appointments - please see the scheduling desk as you leave)

## 2023-11-07 NOTE — Progress Notes (Unsigned)
 Patient ID: Christopher Mcfarland, male   DOB: [birth year], 78 y.o.   MRN: [id number]        Chief Complaint: follow up htn, hld, hyperglycemia, low vit d       HPI:  Christopher Mcfarland is a 78 y.o. male here overall doing ok, Pt denies chest pain, increased sob or doe, wheezing, orthopnea, PND, increased LE swelling, palpitations, dizziness or syncope.   Pt denies polydipsia, polyuria, or new focal neuro s/s.    Pt denies fever, wt loss, night sweats, loss of appetite, or other constitutional symptoms         Wt Readings from Last 3 Encounters:  11/07/23 216 lb (98 kg)  06/03/23 213 lb 12.8 oz (97 kg)  05/06/23 210 lb (95.3 kg)   BP Readings from Last 3 Encounters:  11/07/23 120/72  08/16/23 (!) 147/90  08/04/23 130/80         Past Medical History:  Diagnosis Date   Essential hypertension    Gout 01/02/2020   Gout crystals on joint fluid April 2021   Hepatitis C 2003   Treated   Hyperlipidemia    Past Surgical History:  Procedure Laterality Date   CARPAL TUNNEL RELEASE     bilat 1999   HEMORRHOID SURGERY     1999/4 polyps removed rectum   TRIGGER FINGER RELEASE     right hand 1999    reports that he has never smoked. He has never used smokeless tobacco. He reports that he does not currently use alcohol after a past usage of about 2.0 - 3.0 standard drinks of alcohol per week. He reports that he does not use drugs. family history includes Bone cancer in his father; CAD (age of onset: 63) in his brother; Kidney cancer in his mother. No Known Allergies Current Outpatient Medications on File Prior to Visit  Medication Sig Dispense Refill   aspirin EC 81 MG tablet Take 1 tablet (81 mg total) by mouth daily. Swallow whole. 30 tablet 12   Cholecalciferol 50 MCG (2000 UT) TABS 1 tab by mouth once daily 30 tablet 99   diclofenac Sodium (VOLTAREN) 1 % GEL Apply 4 g topically 4 (four) times daily. 100 g 0   gabapentin (NEURONTIN) 300 MG capsule Take 1 capsule (300 mg total) by mouth 3  (three) times daily as needed. 90 capsule 3   ipratropium (ATROVENT) 0.06 % nasal spray Place 2 sprays into both nostrils 3 (three) times daily.     mupirocin ointment (BACTROBAN) 2 %      predniSONE (DELTASONE) 50 MG tablet Take 1 tablet (50 mg total) by mouth daily. 5 tablet 0   promethazine-dextromethorphan (PROMETHAZINE-DM) 6.25-15 MG/5ML syrup Take 5 mLs by mouth 4 (four) times daily as needed for cough. 118 mL 0   tadalafil (CIALIS) 20 MG tablet Take 0.5-1 tablets (10-20 mg total) by mouth every other day as needed for erectile dysfunction. 10 tablet 11   No current facility-administered medications on file prior to visit.        ROS:  All others reviewed and negative.  Objective        PE:  BP 120/72 (BP Location: Right Arm, Patient Position: Sitting, Cuff Size: Normal)   Pulse 97   Temp 98 F (36.7 C) (Oral)   Ht 5\' 8"  (1.727 m)   Wt 216 lb (98 kg)   SpO2 98%   BMI 32.84 kg/m  Constitutional: Pt appears in NAD               HENT: Head: NCAT.                Right Ear: External ear normal.                 Left Ear: External ear normal.                Eyes: . Pupils are equal, round, and reactive to light. Conjunctivae and EOM are normal               Nose: without d/c or deformity               Neck: Neck supple. Gross normal ROM               Cardiovascular: Normal rate and regular rhythm.                 Pulmonary/Chest: Effort normal and breath sounds without rales or wheezing.                Abd:  Soft, NT, ND, + BS, no organomegaly               Neurological: Pt is alert. At baseline orientation, motor grossly intact               Skin: Skin is warm. No rashes, no other new lesions, LE edema - none               Psychiatric: Pt behavior is normal without agitation   Micro: none  Cardiac tracings I have personally interpreted today:  none  Pertinent Radiological findings (summarize): none   Lab Results  Component Value Date   WBC 4.4 11/24/2022    HGB 13.6 11/24/2022   HCT 41.1 11/24/2022   PLT 181.0 11/24/2022   GLUCOSE 116 (H) 05/06/2023   CHOL 119 05/06/2023   TRIG 59.0 05/06/2023   HDL 37.10 (L) 05/06/2023   LDLDIRECT 78.0 10/19/2021   LDLCALC 70 05/06/2023   ALT 38 05/06/2023   AST 35 05/06/2023   NA 142 05/06/2023   K 4.3 05/06/2023   CL 106 05/06/2023   CREATININE 1.09 05/06/2023   BUN 17 05/06/2023   CO2 29 05/06/2023   TSH 1.43 11/24/2022   PSA 0.85 11/24/2022   INR 1.17 08/20/2015   HGBA1C 6.1 05/06/2023   Assessment/Plan:  Christopher Mcfarland is a 78 y.o. Black or African American [2] male with  has a past medical history of Essential hypertension, Gout (01/02/2020), Hepatitis C (2003), and Hyperlipidemia.  Essential hypertension BP Readings from Last 3 Encounters:  11/07/23 120/72  08/16/23 (!) 147/90  08/04/23 130/80   Stable, pt to continue medical treatment norvasc 5 every day,    Hyperglycemia Lab Results  Component Value Date   HGBA1C 6.1 05/06/2023   Stable, pt to continue current medical treatment  - diet, wt control   Hyperlipidemia Lab Results  Component Value Date   LDLCALC 70 05/06/2023   Uncontrolled, goal ldl < 70,, pt to continue current statin zetia 10 mg every day, crestor 40 mg every day and lower chol diet, declines add repatha for now   Vitamin D deficiency Last vitamin D Lab Results  Component Value Date   VD25OH 42.01 05/06/2023   Stable, cont oral replacement  Followup: Return in about 6 months (around 05/06/2024).  Oliver Barre, MD 11/09/2023 9:58 PM [facility]  [facility] - [facility]

## 2023-11-09 ENCOUNTER — Encounter: Payer: Self-pay | Admitting: Internal Medicine

## 2023-11-09 NOTE — Assessment & Plan Note (Signed)
 Last vitamin D Lab Results  Component Value Date   VD25OH 42.01 05/06/2023   Stable, cont oral replacement

## 2023-11-09 NOTE — Assessment & Plan Note (Signed)
 Lab Results  Component Value Date   HGBA1C 6.1 05/06/2023   Stable, pt to continue current medical treatment  - diet, wt control

## 2023-11-09 NOTE — Assessment & Plan Note (Signed)
 Lab Results  Component Value Date   LDLCALC 70 05/06/2023   Uncontrolled, goal ldl < 70,, pt to continue current statin zetia 10 mg every day, crestor 40 mg every day and lower chol diet, declines add repatha for now

## 2023-11-09 NOTE — Assessment & Plan Note (Signed)
 BP Readings from Last 3 Encounters:  11/07/23 120/72  08/16/23 (!) 147/90  08/04/23 130/80   Stable, pt to continue medical treatment norvasc 5 every day,

## 2023-12-13 DIAGNOSIS — Z7982 Long term (current) use of aspirin: Secondary | ICD-10-CM | POA: Diagnosis not present

## 2023-12-13 DIAGNOSIS — G8929 Other chronic pain: Secondary | ICD-10-CM | POA: Diagnosis not present

## 2023-12-13 DIAGNOSIS — E669 Obesity, unspecified: Secondary | ICD-10-CM | POA: Diagnosis not present

## 2023-12-13 DIAGNOSIS — E785 Hyperlipidemia, unspecified: Secondary | ICD-10-CM | POA: Diagnosis not present

## 2023-12-13 DIAGNOSIS — M109 Gout, unspecified: Secondary | ICD-10-CM | POA: Diagnosis not present

## 2023-12-13 DIAGNOSIS — I10 Essential (primary) hypertension: Secondary | ICD-10-CM | POA: Diagnosis not present

## 2024-01-04 DIAGNOSIS — J300 Vasomotor rhinitis: Secondary | ICD-10-CM | POA: Diagnosis not present

## 2024-01-15 ENCOUNTER — Inpatient Hospital Stay (HOSPITAL_BASED_OUTPATIENT_CLINIC_OR_DEPARTMENT_OTHER)
Admission: EM | Admit: 2024-01-15 | Discharge: 2024-01-18 | DRG: 441 | Disposition: A | Discharge status: Home Health | Attending: Internal Medicine | Admitting: Internal Medicine

## 2024-01-15 ENCOUNTER — Emergency Department (HOSPITAL_BASED_OUTPATIENT_CLINIC_OR_DEPARTMENT_OTHER)

## 2024-01-15 ENCOUNTER — Encounter (HOSPITAL_BASED_OUTPATIENT_CLINIC_OR_DEPARTMENT_OTHER): Payer: Self-pay | Admitting: Emergency Medicine

## 2024-01-15 ENCOUNTER — Other Ambulatory Visit: Payer: Self-pay

## 2024-01-15 DIAGNOSIS — E872 Acidosis, unspecified: Secondary | ICD-10-CM | POA: Diagnosis not present

## 2024-01-15 DIAGNOSIS — A419 Sepsis, unspecified organism: Secondary | ICD-10-CM | POA: Diagnosis not present

## 2024-01-15 DIAGNOSIS — R0609 Other forms of dyspnea: Secondary | ICD-10-CM | POA: Diagnosis not present

## 2024-01-15 DIAGNOSIS — J189 Pneumonia, unspecified organism: Secondary | ICD-10-CM | POA: Diagnosis present

## 2024-01-15 DIAGNOSIS — M5416 Radiculopathy, lumbar region: Secondary | ICD-10-CM | POA: Diagnosis not present

## 2024-01-15 DIAGNOSIS — R918 Other nonspecific abnormal finding of lung field: Secondary | ICD-10-CM | POA: Diagnosis not present

## 2024-01-15 DIAGNOSIS — D649 Anemia, unspecified: Secondary | ICD-10-CM | POA: Diagnosis present

## 2024-01-15 DIAGNOSIS — B179 Acute viral hepatitis, unspecified: Secondary | ICD-10-CM | POA: Diagnosis not present

## 2024-01-15 DIAGNOSIS — Z7982 Long term (current) use of aspirin: Secondary | ICD-10-CM | POA: Diagnosis not present

## 2024-01-15 DIAGNOSIS — Z1152 Encounter for screening for COVID-19: Secondary | ICD-10-CM

## 2024-01-15 DIAGNOSIS — I700 Atherosclerosis of aorta: Secondary | ICD-10-CM | POA: Diagnosis not present

## 2024-01-15 DIAGNOSIS — E876 Hypokalemia: Secondary | ICD-10-CM | POA: Diagnosis present

## 2024-01-15 DIAGNOSIS — I2510 Atherosclerotic heart disease of native coronary artery without angina pectoris: Secondary | ICD-10-CM | POA: Diagnosis present

## 2024-01-15 DIAGNOSIS — E871 Hypo-osmolality and hyponatremia: Secondary | ICD-10-CM | POA: Diagnosis not present

## 2024-01-15 DIAGNOSIS — E8729 Other acidosis: Secondary | ICD-10-CM | POA: Diagnosis present

## 2024-01-15 DIAGNOSIS — D696 Thrombocytopenia, unspecified: Secondary | ICD-10-CM | POA: Diagnosis present

## 2024-01-15 DIAGNOSIS — M109 Gout, unspecified: Secondary | ICD-10-CM | POA: Diagnosis present

## 2024-01-15 DIAGNOSIS — R748 Abnormal levels of other serum enzymes: Secondary | ICD-10-CM | POA: Diagnosis present

## 2024-01-15 DIAGNOSIS — M6282 Rhabdomyolysis: Secondary | ICD-10-CM | POA: Diagnosis present

## 2024-01-15 DIAGNOSIS — E785 Hyperlipidemia, unspecified: Secondary | ICD-10-CM | POA: Diagnosis present

## 2024-01-15 DIAGNOSIS — I10 Essential (primary) hypertension: Secondary | ICD-10-CM | POA: Diagnosis present

## 2024-01-15 DIAGNOSIS — K760 Fatty (change of) liver, not elsewhere classified: Secondary | ICD-10-CM | POA: Diagnosis present

## 2024-01-15 DIAGNOSIS — Z7952 Long term (current) use of systemic steroids: Secondary | ICD-10-CM

## 2024-01-15 DIAGNOSIS — E7800 Pure hypercholesterolemia, unspecified: Secondary | ICD-10-CM | POA: Diagnosis present

## 2024-01-15 DIAGNOSIS — E66811 Obesity, class 1: Secondary | ICD-10-CM | POA: Diagnosis present

## 2024-01-15 DIAGNOSIS — R6520 Severe sepsis without septic shock: Secondary | ICD-10-CM | POA: Diagnosis not present

## 2024-01-15 DIAGNOSIS — E8809 Other disorders of plasma-protein metabolism, not elsewhere classified: Secondary | ICD-10-CM | POA: Diagnosis present

## 2024-01-15 DIAGNOSIS — D6959 Other secondary thrombocytopenia: Secondary | ICD-10-CM | POA: Diagnosis present

## 2024-01-15 DIAGNOSIS — R7401 Elevation of levels of liver transaminase levels: Secondary | ICD-10-CM | POA: Diagnosis not present

## 2024-01-15 DIAGNOSIS — R7989 Other specified abnormal findings of blood chemistry: Secondary | ICD-10-CM | POA: Diagnosis not present

## 2024-01-15 DIAGNOSIS — N179 Acute kidney failure, unspecified: Secondary | ICD-10-CM | POA: Diagnosis present

## 2024-01-15 DIAGNOSIS — B182 Chronic viral hepatitis C: Secondary | ICD-10-CM | POA: Diagnosis present

## 2024-01-15 DIAGNOSIS — Z79899 Other long term (current) drug therapy: Secondary | ICD-10-CM | POA: Diagnosis not present

## 2024-01-15 DIAGNOSIS — R000 Tachycardia, unspecified: Secondary | ICD-10-CM | POA: Diagnosis not present

## 2024-01-15 DIAGNOSIS — R109 Unspecified abdominal pain: Secondary | ICD-10-CM | POA: Diagnosis not present

## 2024-01-15 DIAGNOSIS — R9431 Abnormal electrocardiogram [ECG] [EKG]: Secondary | ICD-10-CM | POA: Diagnosis not present

## 2024-01-15 LAB — URINALYSIS, W/ REFLEX TO CULTURE (INFECTION SUSPECTED)
Bilirubin Urine: NEGATIVE
Glucose, UA: NEGATIVE mg/dL
Ketones, ur: NEGATIVE mg/dL
Leukocytes,Ua: NEGATIVE
Nitrite: NEGATIVE
Protein, ur: 100 mg/dL — AB
Specific Gravity, Urine: 1.01 (ref 1.005–1.030)
WBC, UA: NONE SEEN WBC/hpf (ref 0–5)
pH: 5.5 (ref 5.0–8.0)

## 2024-01-15 LAB — CBC WITH DIFFERENTIAL/PLATELET
Abs Immature Granulocytes: 0.02 10*3/uL (ref 0.00–0.07)
Basophils Absolute: 0.1 10*3/uL (ref 0.0–0.1)
Basophils Relative: 1 %
Eosinophils Absolute: 0 10*3/uL (ref 0.0–0.5)
Eosinophils Relative: 0 %
HCT: 39.3 % (ref 39.0–52.0)
Hemoglobin: 13.6 g/dL (ref 13.0–17.0)
Immature Granulocytes: 0 %
Lymphocytes Relative: 19 %
Lymphs Abs: 1.4 10*3/uL (ref 0.7–4.0)
MCH: 29.5 pg (ref 26.0–34.0)
MCHC: 34.6 g/dL (ref 30.0–36.0)
MCV: 85.2 fL (ref 80.0–100.0)
Monocytes Absolute: 1.1 10*3/uL — ABNORMAL HIGH (ref 0.1–1.0)
Monocytes Relative: 15 %
Neutro Abs: 4.7 10*3/uL (ref 1.7–7.7)
Neutrophils Relative %: 65 %
Platelets: 145 10*3/uL — ABNORMAL LOW (ref 150–400)
RBC: 4.61 MIL/uL (ref 4.22–5.81)
RDW: 14.3 % (ref 11.5–15.5)
WBC: 7.3 10*3/uL (ref 4.0–10.5)
nRBC: 0 % (ref 0.0–0.2)

## 2024-01-15 LAB — COMPREHENSIVE METABOLIC PANEL WITH GFR
ALT: 721 U/L — ABNORMAL HIGH (ref 0–44)
AST: 1162 U/L — ABNORMAL HIGH (ref 15–41)
Albumin: 4.1 g/dL (ref 3.5–5.0)
Alkaline Phosphatase: 73 U/L (ref 38–126)
Anion gap: 20 — ABNORMAL HIGH (ref 5–15)
BUN: 21 mg/dL (ref 8–23)
CO2: 20 mmol/L — ABNORMAL LOW (ref 22–32)
Calcium: 9.8 mg/dL (ref 8.9–10.3)
Chloride: 92 mmol/L — ABNORMAL LOW (ref 98–111)
Creatinine, Ser: 1.82 mg/dL — ABNORMAL HIGH (ref 0.61–1.24)
GFR, Estimated: 38 mL/min — ABNORMAL LOW (ref >60)
Glucose, Bld: 117 mg/dL — ABNORMAL HIGH (ref 70–99)
Potassium: 4.1 mmol/L (ref 3.5–5.1)
Sodium: 132 mmol/L — ABNORMAL LOW (ref 135–145)
Total Bilirubin: 1 mg/dL (ref 0.0–1.2)
Total Protein: 8.6 g/dL — ABNORMAL HIGH (ref 6.5–8.1)

## 2024-01-15 LAB — PROTIME-INR
INR: 1.7 — ABNORMAL HIGH (ref 0.8–1.2)
Prothrombin Time: 19.9 s — ABNORMAL HIGH (ref 11.4–15.2)

## 2024-01-15 LAB — RESP PANEL BY RT-PCR (RSV, FLU A&B, COVID)  RVPGX2
Influenza A by PCR: NEGATIVE
Influenza B by PCR: NEGATIVE
Resp Syncytial Virus by PCR: NEGATIVE
SARS Coronavirus 2 by RT PCR: NEGATIVE

## 2024-01-15 LAB — HEPATITIS PANEL, ACUTE
HCV Ab: REACTIVE — AB
Hep A IgM: NONREACTIVE
Hep B C IgM: NONREACTIVE
Hepatitis B Surface Ag: NONREACTIVE

## 2024-01-15 LAB — STREP PNEUMONIAE URINARY ANTIGEN: Strep Pneumo Urinary Antigen: NEGATIVE

## 2024-01-15 LAB — CK: Total CK: 1081 U/L — ABNORMAL HIGH (ref 49–397)

## 2024-01-15 LAB — LACTIC ACID, PLASMA: Lactic Acid, Venous: 1.2 mmol/L (ref 0.5–1.9)

## 2024-01-15 LAB — PHOSPHORUS: Phosphorus: 2.9 mg/dL (ref 2.5–4.6)

## 2024-01-15 LAB — LIPASE, BLOOD: Lipase: 605 U/L — ABNORMAL HIGH (ref 11–51)

## 2024-01-15 LAB — MAGNESIUM: Magnesium: 2 mg/dL (ref 1.7–2.4)

## 2024-01-15 MED ORDER — ACETAMINOPHEN 325 MG PO TABS: 650.0000 mg | ORAL_TABLET | Freq: Four times a day (QID) | ORAL | Status: DC | PRN

## 2024-01-15 MED ORDER — SODIUM CHLORIDE 0.9 % IV BOLUS
1000.0000 mL | Freq: Once | INTRAVENOUS | Status: AC
  Administered 2024-01-15: 1000 mL via INTRAVENOUS

## 2024-01-15 MED ORDER — ONDANSETRON HCL 4 MG/2ML IJ SOLN: 4.0000 mg | Freq: Four times a day (QID) | INTRAMUSCULAR | Status: DC | PRN

## 2024-01-15 MED ORDER — SODIUM CHLORIDE 0.9 % IV SOLN: INTRAVENOUS | Status: DC

## 2024-01-15 MED ORDER — LACTATED RINGERS IV SOLN: INTRAVENOUS | Status: DC

## 2024-01-15 MED ORDER — SODIUM CHLORIDE 0.9 % IV BOLUS (SEPSIS)
500.0000 mL | Freq: Once | INTRAVENOUS | Status: AC
  Administered 2024-01-15: 500 mL via INTRAVENOUS

## 2024-01-15 MED ORDER — ACETAMINOPHEN 325 MG PO TABS
650.0000 mg | ORAL_TABLET | Freq: Once | ORAL | Status: AC
Start: 2024-01-15 — End: 2024-01-15
  Administered 2024-01-15: 650 mg via ORAL
  Filled 2024-01-15: qty 2

## 2024-01-15 MED ORDER — ACETAMINOPHEN 650 MG RE SUPP: 650.0000 mg | Freq: Four times a day (QID) | RECTAL | Status: DC | PRN

## 2024-01-15 MED ORDER — SODIUM CHLORIDE 0.9 % IV BOLUS (SEPSIS)
1000.0000 mL | Freq: Once | INTRAVENOUS | Status: AC
Start: 2024-01-15 — End: 2024-01-15
  Administered 2024-01-15: 1000 mL via INTRAVENOUS

## 2024-01-15 MED ORDER — ONDANSETRON HCL 4 MG PO TABS: 4.0000 mg | ORAL_TABLET | Freq: Four times a day (QID) | ORAL | Status: DC | PRN

## 2024-01-15 MED ORDER — PANTOPRAZOLE SODIUM 40 MG IV SOLR
40.0000 mg | Freq: Once | INTRAVENOUS | Status: AC
  Administered 2024-01-15: 40 mg via INTRAVENOUS
  Filled 2024-01-15: qty 10

## 2024-01-15 MED ORDER — SODIUM CHLORIDE 0.9 % IV SOLN
2.0000 g | INTRAVENOUS | Status: DC
  Administered 2024-01-16 – 2024-01-17 (×2): 2 g via INTRAVENOUS
  Filled 2024-01-15 (×2): qty 20

## 2024-01-15 MED ORDER — SODIUM CHLORIDE 0.9 % IV SOLN
2.0000 g | Freq: Once | INTRAVENOUS | Status: AC
  Administered 2024-01-15: 2 g via INTRAVENOUS
  Filled 2024-01-15: qty 20

## 2024-01-15 MED ORDER — HYDROMORPHONE HCL 1 MG/ML IJ SOLN: 0.5000 mg | INTRAMUSCULAR | Status: DC | PRN

## 2024-01-15 MED ORDER — SODIUM CHLORIDE 0.9 % IV SOLN
500.0000 mg | Freq: Once | INTRAVENOUS | Status: AC
  Administered 2024-01-15: 500 mg via INTRAVENOUS
  Filled 2024-01-15: qty 5

## 2024-01-15 MED ORDER — SODIUM CHLORIDE 0.9 % IV SOLN
500.0000 mg | INTRAVENOUS | Status: DC
  Administered 2024-01-16 – 2024-01-17 (×2): 500 mg via INTRAVENOUS
  Filled 2024-01-15 (×3): qty 5

## 2024-01-15 MED ORDER — IOHEXOL 300 MG/ML  SOLN
75.0000 mL | Freq: Once | INTRAMUSCULAR | Status: AC | PRN
  Administered 2024-01-15: 75 mL via INTRAVENOUS

## 2024-01-15 MED ORDER — ACETAMINOPHEN 500 MG PO TABS
1000.0000 mg | ORAL_TABLET | Freq: Once | ORAL | Status: AC
  Administered 2024-01-15: 1000 mg via ORAL
  Filled 2024-01-15: qty 2

## 2024-01-15 NOTE — ED Triage Notes (Addendum)
 States has been feeling weak x 1 week . 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 Chronic cough at night

## 2024-01-15 NOTE — ED Notes (Signed)
 Pt walked to the bathroom with assistance from his wife. No incident noted. Upright and ambulatory with minor SOB after exertion.

## 2024-01-15 NOTE — ED Provider Notes (Signed)
 [place] [facility] AT [facility] Provider Note   CSN: [id number] Arrival date & time: 01/15/24  0818     History  Chief Complaint  Patient presents with   Weakness    Christopher Mcfarland is a 78 y.o. male.  Generalized weakness for last couple days fever today.  Some intermittent abdominal cramping and feel like he can have diarrhea but does not.  History of hepatitis C that was treated high cholesterol hypertension.  He denies any urinary symptoms.  No major cough or sputum production that is different than his normal.  No rash.  No headache neck pain.  Just feels weak body aches all over.  No obvious sick contacts.  The history is provided by the patient.       Home Medications Prior to Admission medications   Medication Sig Start Date End Date Taking? Authorizing Provider  allopurinol  (ZYLOPRIM ) 300 MG tablet TAKE 1 TABLET(300 MG) BY MOUTH DAILY 11/07/23   Roslyn Coombe, MD  amLODipine  (NORVASC ) 5 MG tablet TAKE 1 TABLET(5 MG) BY MOUTH DAILY 11/07/23   Roslyn Coombe, MD  aspirin  EC 81 MG tablet Take 1 tablet (81 mg total) by mouth daily. Swallow whole. 08/25/22   Roslyn Coombe, MD  Cholecalciferol  50 MCG (2000 UT) TABS 1 tab by mouth once daily 06/08/21   [name], [name], MD  diclofenac  Sodium (VOLTAREN ) 1 % GEL Apply 4 g topically 4 (four) times daily. 12/22/19   Albertus Hughs, DO  ezetimibe  (ZETIA ) 10 MG tablet Take 1 tablet (10 mg total) by mouth daily. 11/07/23   Roslyn Coombe, MD  gabapentin  (NEURONTIN ) 300 MG capsule Take 1 capsule (300 mg total) by mouth 3 (three) times daily as needed. 08/04/23   Corey, Evan S, MD  ipratropium (ATROVENT ) 0.06 % nasal spray Place 2 sprays into both nostrils 3 (three) times daily. 07/07/19   [provider]  mupirocin  ointment (BACTROBAN ) 2 %  11/13/19   [provider]  predniSONE  (DELTASONE ) 50 MG tablet Take 1 tablet (50 mg total) by mouth daily. 08/04/23   Corey, Evan S, MD  promethazine -dextromethorphan  (PROMETHAZINE -DM) 6.25-15 MG/5ML syrup Take 5 mLs by mouth 4 (four) times daily as needed for cough. 06/08/23   Roslyn Coombe, MD  rosuvastatin  (CRESTOR ) 40 MG tablet Take 1 tablet (40 mg total) by mouth daily. 11/07/23   Roslyn Coombe, MD  tadalafil  (CIALIS ) 20 MG tablet Take 0.5-1 tablets (10-20 mg total) by mouth every other day as needed for erectile dysfunction. 08/25/22   Roslyn Coombe, MD      Allergies    Patient has no known allergies.    Review of Systems   Review of Systems  Physical Exam Updated Vital Signs BP 123/81   Pulse 98   Temp 98.7 F (37.1 C) (Oral)   Resp 19   Ht 5\' 8"  (1.727 m)   Wt 93 kg   SpO2 92%   BMI 31.17 kg/m  Physical Exam Vitals and nursing note reviewed.  Constitutional:      General: He is not in acute distress.    Appearance: He is well-developed. He is not ill-appearing.  HENT:     Head: Normocephalic and atraumatic.     Nose: Nose normal.     Mouth/Throat:     Mouth: Mucous membranes are moist.  Eyes:     Extraocular Movements: Extraocular movements intact.     Conjunctiva/sclera: Conjunctivae normal.     Pupils:  Pupils are equal, round, and reactive to light.  Cardiovascular:     Rate and Rhythm: Regular rhythm. Tachycardia present.     Pulses: Normal pulses.     Heart sounds: Normal heart sounds. No murmur heard. Pulmonary:     Effort: Pulmonary effort is normal. No respiratory distress.     Breath sounds: Normal breath sounds.  Abdominal:     Palpations: Abdomen is soft.     Tenderness: There is abdominal tenderness.  Musculoskeletal:        General: No swelling.     Cervical back: Normal range of motion and neck supple.  Skin:    General: Skin is warm and dry.     Capillary Refill: Capillary refill takes less than 2 seconds.  Neurological:     General: No focal deficit present.     Mental Status: He is alert and oriented to person, place, and time.     Cranial Nerves: No cranial nerve deficit.     Sensory: No sensory  deficit.     Motor: No weakness.     Coordination: Coordination normal.  Psychiatric:        Mood and Affect: Mood normal.     ED Results / Procedures / Treatments   Labs (all labs ordered are listed, but only abnormal results are displayed) Labs Reviewed  COMPREHENSIVE METABOLIC PANEL WITH GFR - Abnormal; Notable for the following components:      Result Value   Sodium 132 (*)    Chloride 92 (*)    CO2 20 (*)    Glucose, Bld 117 (*)    Creatinine, Ser 1.82 (*)    Total Protein 8.6 (*)    AST 1,162 (*)    ALT 721 (*)    GFR, Estimated 38 (*)    Anion gap 20 (*)    All other components within normal limits  CBC WITH DIFFERENTIAL/PLATELET - Abnormal; Notable for the following components:   Platelets 145 (*)    Monocytes Absolute 1.1 (*)    All other components within normal limits  PROTIME-INR - Abnormal; Notable for the following components:   Prothrombin Time 19.9 (*)    INR 1.7 (*)    All other components within normal limits  RESP PANEL BY RT-PCR (RSV, FLU A&B, COVID)  [id number]  CULTURE, BLOOD (ROUTINE X 2)  CULTURE, BLOOD (ROUTINE X 2)  LACTIC ACID, PLASMA  URINALYSIS, W/ REFLEX TO CULTURE (INFECTION SUSPECTED)  HEPATITIS PANEL, ACUTE  LIPASE, BLOOD    EKG EKG Interpretation Date/Time:  Sunday Jan 15 2024 08:39:40 EDT Ventricular Rate:  121 PR Interval:  164 QRS Duration:  82 QT Interval:  309 QTC Calculation: 439 R Axis:   -21  Text Interpretation: Sinus tachycardia Borderline left axis deviation Borderline low voltage, extremity leads Confirmed by Lowery Rue [phone]) on 01/15/2024 8:50:44 AM  Radiology US  Abdomen Limited RUQ (LIVER/GB) Result Date: 01/15/2024 CLINICAL DATA:  Elevated LFTs. EXAM: ULTRASOUND ABDOMEN LIMITED RIGHT UPPER QUADRANT COMPARISON:  CT from earlier today FINDINGS: Gallbladder: The gallbladder wall thickness is upper limits of normal at 2.9 mm. No gallstones, gallbladder sludge or pericholecystic fluid. Negative sonographic Murphy's  sign. Common bile duct: Diameter: Nondilated measuring approximately 2 mm. No intrahepatic bile duct dilatation. Liver: Increased parenchymal echogenicity. Tiny hypoechoic structure within the dome of the lateral segment of left lobe measures 0.1 x 0.6 cm. Small echogenic structure within the right lobe of liver measures 0.9 x 0.8 cm. Portal vein is patent on color Doppler imaging  with normal direction of blood flow towards the liver. Other: None. IMPRESSION: 1. No acute findings. 2. Increased hepatic parenchymal echogenicity compatible with fatty infiltration. 3. Small echogenic structure within the right lobe of liver measures 0.9 x 0.8 cm. This is nonspecific but may represent a small hemangioma. 4. Tiny hypoechoic structure within the dome of the lateral segment of left lobe measures 0.1 x 0.6 cm. This is nonspecific but may represent a small cyst. Electronically Signed   By: Kimberley Penman M.D.   On: 01/15/2024 11:28   CT ABDOMEN PELVIS W CONTRAST Result Date: 01/15/2024 CLINICAL DATA:  Acute abdominal pain and weakness for 1 week. EXAM: CT ABDOMEN AND PELVIS WITH CONTRAST TECHNIQUE: Multidetector CT imaging of the abdomen and pelvis was performed using the standard protocol following bolus administration of intravenous contrast. RADIATION DOSE REDUCTION: This exam was performed according to the departmental dose-optimization program which includes automated exposure control, adjustment of the mA and/or kV according to patient size and/or use of iterative reconstruction technique. CONTRAST:  75mL OMNIPAQUE IOHEXOL 300 MG/ML  SOLN COMPARISON:  None Available. FINDINGS: Lower Chest: Pulmonary airspace opacity with air bronchograms is seen in the lingula, suspicious for pneumonia. Hepatobiliary: No suspicious hepatic masses identified. Gallbladder is unremarkable. No evidence of biliary ductal dilatation. Pancreas:  No mass or inflammatory changes. Spleen: Within normal limits in size and appearance.  Adrenals/Urinary Tract: No suspicious masses identified. No evidence of ureteral calculi or hydronephrosis. Stomach/Bowel: No evidence of obstruction, inflammatory process or abnormal fluid collections. Normal appendix visualized. Vascular/Lymphatic: No pathologically enlarged lymph nodes. No acute vascular findings. Reproductive:  No mass or other significant abnormality. Other:  None. Musculoskeletal:  No suspicious bone lesions identified. IMPRESSION: No acute findings within the abdomen or pelvis. Pulmonary airspace opacity in the lingula, suspicious for pneumonia. Electronically Signed   By: Marlyce Sine M.D.   On: 01/15/2024 10:59   DG Chest Port 1 View Result Date: 01/15/2024 CLINICAL DATA:  Questionable sepsis - evaluate for abnormality EXAM: PORTABLE CHEST - 1 VIEW COMPARISON:  06/08/2021. FINDINGS: Cardiac silhouette is unremarkable. No pneumothorax. No pleural effusion. Calcified aorta. The visualized skeletal structures are unremarkable. Alveolar opacity consistent with left mid lungpneumonia versus volume loss. Follow up recommended to confirm resolution as a precaution. IMPRESSION: Left-sided alveolar process may represent pneumonia. Follow-up after therapy recommended to confirm resolution. Electronically Signed   By: Sydell Eva M.D.   On: 01/15/2024 09:29    Procedures .Critical Care  Performed by: Lowery Rue, DO Authorized by: Lowery Rue, DO   Critical care provider statement:    Critical care time (minutes):  40   Critical care was necessary to treat or prevent imminent or life-threatening deterioration of the following conditions:  Sepsis   Critical care was time spent personally by me on the following activities:  Blood draw for specimens, development of treatment plan with patient or surrogate, discussions with primary provider, evaluation of patient's response to treatment, examination of patient, obtaining history from patient or surrogate, ordering and performing  treatments and interventions, ordering and review of laboratory studies, ordering and review of radiographic studies, pulse oximetry, re-evaluation of patient's condition and review of old charts   Care discussed with: admitting provider       Medications Ordered in ED Medications  sodium chloride  0.9 % bolus 500 mL (500 mLs Intravenous New Bag/Given 01/15/24 1122)  sodium chloride  0.9 % bolus 1,000 mL (0 mLs Intravenous Stopped 01/15/24 1037)  cefTRIAXone (ROCEPHIN) 2 g in sodium chloride  0.9 %  100 mL IVPB (0 g Intravenous Stopped 01/15/24 0941)  azithromycin (ZITHROMAX) 500 mg in sodium chloride  0.9 % 250 mL IVPB (0 mg Intravenous Stopped 01/15/24 1045)  acetaminophen  (TYLENOL ) tablet 1,000 mg (1,000 mg Oral Given 01/15/24 0900)  iohexol (OMNIPAQUE) 300 MG/ML solution 75 mL (75 mLs Intravenous Contrast Given 01/15/24 1044)    ED Course/ Medical Decision Making/ A&P                                 Medical Decision Making Amount and/or Complexity of Data Reviewed Labs: ordered. Radiology: ordered.  Risk OTC drugs. Prescription drug management. Decision regarding hospitalization.   Rosalea Collin is here with weakness and fever.  Sounds like he has been weak for the last couple days but may be fever just today.  He is Fever tachycardia upon arrival here.  Some abdominal tenderness.  Overall differential diagnosis likely viral process but will evaluate for pneumonia and UTI intra-abdominal infection pneumonia.  Not having any chest pain shortness of breath I have no concern for ACS or PE other acute process.  Will pursue infectious workup.  Will start some broad-spectrum IV antibiotics get lactic acid blood cultures COVID flu RSV test and CT scan abdomen pelvis chest x-ray.  Will give fluid bolus and Tylenol  and reevaluate.  Liver enzymes are elevated per my review and interpretation of labs.  AST 1600, ALT 700.  No white count no lactic acidosis.  Does have an AKI with a creatinine of 1.8.   Chest x-ray consistent with pneumonia.  This was seen on CT scan abdomen pelvis as well.  Ultrasound of the liver was overall unremarkable.  Not sure if this is from dehydration or maybe alcohol use.  INR is 1.7.  Ultimately given metabolic derangements AKI concern for infectious process sepsis pneumonia will admit for further care.  Admitted to hospitalist service.  Hemodynamically improved throughout my care.  This chart was dictated using voice recognition software.  Despite best efforts to proofread,  errors can occur which can change the documentation meaning.         Final Clinical Impression(s) / ED Diagnoses Final diagnoses:  Sepsis, due to unspecified organism, unspecified whether acute organ dysfunction present (HCC)  AKI (acute kidney injury) (HCC)  Community acquired pneumonia, unspecified laterality  Elevated liver enzymes    Rx / DC Orders ED Discharge Orders     None         Lowery Rue, DO 01/15/24 1206

## 2024-01-15 NOTE — ED Notes (Signed)
 Called [facility] for transport.

## 2024-01-15 NOTE — Progress Notes (Signed)
[facility] following for sepsis protocol. ?

## 2024-01-15 NOTE — Progress Notes (Signed)
 Plan of Care Note for accepted transfer   Patient: Christopher Mcfarland MRN: [id number]   DOA: 01/15/2024  Facility requesting transfer: [facility].  Requesting Provider: Lowery Rue, DO. Reason for transfer: Acute hepatitis, AKI and CAP. 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  Workup is showing significant transaminitis, AKI with a creatinine level 1.81 mg/dL and a left lower lobe infiltrate on chest radiograph.  He has been given IV for liquids, azithromycin, ceftriaxone and 1000 mg of acetaminophen  p.o.  Lab work: Protime-INR [[id number]] (Abnormal)   Collected: 01/15/24 1115   Updated: 01/15/24 1141   Specimen Type: Blood    Prothrombin Time 19.9 High  seconds   INR 1.7 High   Hepatitis panel, acute [[id number]]   Collected: 01/15/24 1115   Updated: 01/15/24 1131   Specimen Type: Blood   Lipase, blood [[id number]]   Collected: 01/15/24 1115   Updated: 01/15/24 1130   Specimen Type: Blood   Blood Culture (routine x 2) [[id number]]   Collected: 01/15/24 0800   Updated: 01/15/24 1050   Specimen Type: Blood   Blood Culture (routine x 2) [[id number]]   Collected: 01/15/24 0804   Updated: 01/15/24 1048   Specimen Type: Blood   Comprehensive metabolic panel [[id number]] (Abnormal)   Collected: 01/15/24 0858   Updated: 01/15/24 1005   Specimen Type: Blood    Sodium 132 Low  mmol/L   Potassium 4.1 mmol/L   Chloride 92 Low  mmol/L   CO2 20 Low  mmol/L   Glucose, Bld 117 High  mg/dL   BUN 21 mg/dL   Creatinine, Ser 7.56 High  mg/dL   Calcium  9.8 mg/dL   Total Protein 8.6 High  g/dL   Albumin 4.1 g/dL   AST 4,332 High  U/L   ALT 721 High  U/L   Alkaline Phosphatase 73 U/L   Total Bilirubin 1.0 mg/dL   GFR, Estimated 38 Low  mL/min   Anion gap 20 High    CBC with Differential [[id number]] (Abnormal)   Collected: 01/15/24 0858   Updated: 01/15/24 0930   Specimen Type: Blood    WBC 7.3 K/uL   RBC 4.61 MIL/uL   Hemoglobin 13.6 g/dL   HCT 06.3 %   MCV 01.6 fL   MCH 29.5 pg   MCHC 34.6 g/dL   RDW 01.0 %   Platelets 145 Low  K/uL   nRBC 0.0 %   Neutrophils Relative % 65 %   Neutro Abs 4.7 K/uL   Lymphocytes Relative 19 %   Lymphs Abs 1.4 K/uL   Monocytes Relative 15 %   Monocytes Absolute 1.1 High  K/uL   Eosinophils Relative 0 %   Eosinophils Absolute 0.0 K/uL   Basophils Relative 1 %   Basophils Absolute 0.1 K/uL   Immature Granulocytes 0 %   Abs Immature Granulocytes 0.02 K/uL  Lactic acid, plasma [[id number]]   Collected: 01/15/24 0858   Updated: 01/15/24 0918   Specimen Type: Blood    Lactic Acid, Venous 1.2 mmol/L  Resp panel by RT-PCR (RSV, Flu A&B, Covid) Anterior Nasal Swab [[id number]]   Collected: 01/15/24 0829   Updated: 01/15/24 0914   Specimen Source: Anterior Nasal Swab    SARS Coronavirus 2 by RT PCR NEGATIVE   Influenza A by PCR NEGATIVE   Influenza B  by PCR NEGATIVE   Resp Syncytial Virus by PCR NEGATIVE   Imaging:  Lumbosacral spine x-ray 2-3 view.  IMPRESSION: Moderate multilevel degenerative changes, most pronounced at L4-5 level, as described above.  CT abdomen/pelvis with contrast.  FINDINGS: Lower Chest: Pulmonary airspace opacity with air bronchograms is seen in the lingula, suspicious for pneumonia.   Hepatobiliary: No suspicious hepatic masses identified. Gallbladder is unremarkable. No evidence of biliary ductal dilatation.   Pancreas:  No mass or inflammatory changes.   Spleen: Within normal limits in size and appearance.   Adrenals/Urinary Tract: No suspicious masses identified. No evidence of ureteral calculi or hydronephrosis.   Stomach/Bowel: No evidence of obstruction, inflammatory process or abnormal fluid collections. Normal appendix visualized.   Vascular/Lymphatic: No  pathologically enlarged lymph nodes. No acute vascular findings.   Reproductive:  No mass or other significant abnormality.   Other:  None.   Musculoskeletal:  No suspicious bone lesions identified.   IMPRESSION: No acute findings within the abdomen or pelvis.   Pulmonary airspace opacity in the lingula, suspicious for pneumonia.   Electronically Signed   By: Marlyce Sine M.D.   On: 01/15/2024 10:59  Plan of care: The patient is accepted for admission to Progressive unit, at [facility].  Author: Danice Dural, MD 01/15/2024  Check [email or web address] for on-call coverage.  Nursing staff, Please call [facility] Admits & Consults System-Wide number on Amion as soon as patient's arrival, so appropriate admitting provider can evaluate the pt.

## 2024-01-15 NOTE — H&P (Addendum)
 History and Physical    Patient: Christopher Mcfarland:[id number] DOB: July 19, 1946 DOA: 01/15/2024 DOS: the patient was seen and examined on 01/15/2024 PCP: Roslyn Coombe, MD  Patient coming from: Home  Chief Complaint:  Chief Complaint  Patient presents with   Weakness   HPI: Christopher Mcfarland is a 78 y.o. male with medical history significant of essential hypertension, gout, treated hepatitis C, history of liver mass, hyperlipidemia, class I obesity, left lumbar radiculopathy, coronary artery calcification who presented to the emergency department with complaints of generalized weakness for about a week associated with daily fever for the last 4 days and abdominal cramping.  He had to block a cocktails on Monday last week.  He stated he has not drank since then and has not been drinking regularly.  No diarrhea, constipation, melena or hematochezia. No flank pain, dysuria, frequency or hematuria.He denied fever, chills, rhinorrhea, sore throat, wheezing or hemoptysis. No chest pain, palpitations, diaphoresis, PND, orthopnea or pitting edema of the lower extremities. No polyuria, polydipsia, polyphagia or blurred vision.   Lab work: Urinalysis A/C, hemoglobin moderate, protein 100 and bacteria rare.  CBCs are white count 7.3, hemoglobin 13.6 g/dL and platelets 440.  PT 19.9 and INR 1.7.  Lipase 605 and total CK 1081 units/L.  Lactic acid was normal.  Coronavirus, influenza and RSV PCR was negative.  Calcium  9.8, magnesium is 2.0 and phosphorus 2.9 mg/dL.  Sodium 132, potassium 4.1, chloride 92 and CO2 20 mmol/L with an anion gap of 20.  Total bilirubin 1.0, glucose 117, BUN 21 and creatinine 1.82 mg/dL.  Total protein 8.6 and albumin 4.1 g/dL.  Alkaline phosphatase 73, AST 1162 and ALT 721 units/L.  Imaging: Portable 1 view chest radiograph showing left side alveolar process, may represent pneumonia.  CT abdomen/pelvis with contrast no acute findings in the abdomen or pelvis.  There is pulmonary  airspace opacity in the lingula, suspicious for pneumonia.  RUQ ultrasound with no acute findings.  There is increased hepatic parenchymal echogenicity compatible with fatty infiltration.  Small echogenic structure within the right lobe of the liver measures 0.9 x 0.8 cm.  This is nonspecific, but may represent small hemangioma.  0.1 x 0.6 cm within the dome of the lateral segment of the left lobe that is likely a hepatic cyst.  ED course: Initial vital signs were temperature 101.1 F, pulse 124, respirations 16, BP 138/85 mmHg O2 sat 90% on room air.  The patient received 500 mL of normal saline bolus, ceftriaxone 2 g IVPB and azithromycin 500 mg IVPB.   Review of Systems: As mentioned in the history of present illness. All other systems reviewed and are negative. Past Medical History:  Diagnosis Date   Essential hypertension    Gout 01/02/2020   Gout crystals on joint fluid April 2021   Hepatitis C 2003   Treated   Hyperlipidemia    Past Surgical History:  Procedure Laterality Date   CARPAL TUNNEL RELEASE     bilat 1999   HEMORRHOID SURGERY     1999/4 polyps removed rectum   TRIGGER FINGER RELEASE     right hand 1999   Social History:  reports that he has never smoked. He has never used smokeless tobacco. He reports that he does not currently use alcohol after a past usage of about 2.0 - 3.0 standard drinks of alcohol per week. He reports that he does not use drugs.  No Known Allergies  Family History  Problem Relation Age of Onset  CAD Brother 10   Kidney cancer Mother    Bone cancer Father    Colon cancer Neg Hx    Pancreatic cancer Neg Hx    Rectal cancer Neg Hx    Stomach cancer Neg Hx     Prior to Admission medications   Medication Sig Start Date End Date Taking? Authorizing Provider  allopurinol  (ZYLOPRIM ) 300 MG tablet TAKE 1 TABLET(300 MG) BY MOUTH DAILY 11/07/23  Yes Roslyn Coombe, MD  amLODipine  (NORVASC ) 5 MG tablet TAKE 1 TABLET(5 MG) BY MOUTH DAILY 11/07/23  Yes  Roslyn Coombe, MD  aspirin  EC 81 MG tablet Take 1 tablet (81 mg total) by mouth daily. Swallow whole. 08/25/22  Yes Roslyn Coombe, MD  Cholecalciferol  50 MCG (2000 UT) TABS 1 tab by mouth once daily 06/08/21  Yes [name], [name], MD  diclofenac  Sodium (VOLTAREN ) 1 % GEL Apply 4 g topically 4 (four) times daily. 12/22/19  Yes Albertus Hughs, DO  ezetimibe  (ZETIA ) 10 MG tablet Take 1 tablet (10 mg total) by mouth daily. 11/07/23   Roslyn Coombe, MD  gabapentin  (NEURONTIN ) 300 MG capsule Take 1 capsule (300 mg total) by mouth 3 (three) times daily as needed. 08/04/23   Corey, Evan S, MD  ipratropium (ATROVENT ) 0.06 % nasal spray Place 2 sprays into both nostrils 3 (three) times daily. 07/07/19   [provider]  mupirocin  ointment (BACTROBAN ) 2 %  11/13/19   [provider]  predniSONE  (DELTASONE ) 50 MG tablet Take 1 tablet (50 mg total) by mouth daily. 08/04/23   Corey, Evan S, MD  promethazine -dextromethorphan (PROMETHAZINE -DM) 6.25-15 MG/5ML syrup Take 5 mLs by mouth 4 (four) times daily as needed for cough. 06/08/23   Roslyn Coombe, MD  rosuvastatin  (CRESTOR ) 40 MG tablet Take 1 tablet (40 mg total) by mouth daily. 11/07/23  Yes Roslyn Coombe, MD  tadalafil  (CIALIS ) 20 MG tablet Take 0.5-1 tablets (10-20 mg total) by mouth every other day as needed for erectile dysfunction. 08/25/22  Yes Roslyn Coombe, MD    Physical Exam: Vitals:   01/15/24 1330 01/15/24 1400 01/15/24 1408 01/15/24 1518  BP:   126/82 (!) 147/78  Pulse: 96 97 97 (!) 115  Resp: 20 (!) 22 16   Temp:   99.7 F (37.6 C) 99.4 F (37.4 C)  TempSrc:   Oral Oral  SpO2: 93% 95% 95% 98%  Weight:      Height:    5\' 8"  (1.727 m)   Physical Exam Vitals and nursing note reviewed.  Constitutional:      General: He is awake. He is not in acute distress.    Appearance: He is ill-appearing.  HENT:     Head: Normocephalic.     Nose: No rhinorrhea.     Mouth/Throat:     Mouth: Mucous membranes are dry.  Eyes:     General: No  scleral icterus.    Pupils: Pupils are equal, round, and reactive to light.  Neck:     Vascular: No JVD.  Cardiovascular:     Rate and Rhythm: Normal rate and regular rhythm.     Heart sounds: S1 normal and S2 normal.  Pulmonary:     Breath sounds: No wheezing or rales.  Abdominal:     General: Bowel sounds are normal.     Palpations: Abdomen is soft.     Tenderness: There is no abdominal tenderness. There is no right CVA tenderness, left CVA tenderness or guarding.  Musculoskeletal:  Cervical back: Neck supple.     Right lower leg: No edema.     Left lower leg: No edema.  Neurological:     General: No focal deficit present.     Mental Status: He is alert and oriented to person, place, and time.  Psychiatric:        Mood and Affect: Mood normal.        Behavior: Behavior normal. Behavior is cooperative.    Data Reviewed:  Results are pending, will review when available. EKG: Vent. rate 121 BPM PR interval 164 ms QRS duration 82 ms QT/QTcB 309/439 ms P-R-T axes 21 -21 69 Sinus tachycardia Borderline left axis deviation Borderline low voltage, extremity leads  Assessment and Plan: Principal Problem:   Acute hepatitis Statin use. History of hep C. Intake alcohol cocktail x 2 last Monday. Clear liquid diet. Antiemetics as needed. Analgesics as needed. Check acute hepatitis panel. Follow-up CMP, PT/INR and platelets in AM. Check ammonia level in the morning.  Active Problems:   Elevated lipase  Normal pancreas on imaging. Follow-up lipase level in the morning.    Left lower lobe pulmonary infiltrate Admit to PCU/inpatient. As needed supplemental oxygen. As needed bronchodilators. Continue ceftriaxone 1 g IVPB daily. Continue azithromycin 500 mg IVPB daily. Check strep pneumoniae urinary antigen. Check sputum Gram stain, culture and sensitivity. Follow-up blood culture and sensitivity. Follow-up CBC and chemistry in the morning.    Metabolic acidosis,  increased anion gap Lactic acid was normal. Likely from mild azotemia. In the setting of:   AKI (acute kidney injury) (HCC) Continue IV fluids. Avoid hypotension. Avoid nephrotoxins. Monitor intake and output. Monitor renal function electrolytes.    Hyponatremia Due to poor oral intake. Follow sodium level in the morning.    Elevated CK In the setting of statin use. Follow-up total CK tomorrow morning.    Hyperproteinemia Secondary to hemoconcentration. Continue IV hydration and follow-up in AM.    Thrombocytopenia (HCC) Follow platelet count in the morning.    Essential hypertension Continue amlodipine  5 mg p.o. daily.    Hyperlipidemia Hold rosuvastatin .    Left lumbar radiculopathy Analgesics as needed.    Coronary artery calcification Holding aspirin  for now. Follow-up with PCP or cardiology as an outpatient.     Advance Care Planning:   Code Status: Full Code   Consults: [place] [facility].  Family Communication: His wife was at bedside.  Severity of Illness: The appropriate patient status for this patient is INPATIENT. Inpatient status is judged to be reasonable and necessary in order to provide the required intensity of service to ensure the patient's safety. The patient's presenting symptoms, physical exam findings, and initial radiographic and laboratory data in the context of their chronic comorbidities is felt to place them at high risk for further clinical deterioration. Furthermore, it is not anticipated that the patient will be medically stable for discharge from the hospital within 2 midnights of admission.   * I certify that at the point of admission it is my clinical judgment that the patient will require inpatient hospital care spanning beyond 2 midnights from the point of admission due to high intensity of service, high risk for further deterioration and high frequency of surveillance required.*  Author: Danice Dural, MD 01/15/2024  3:43 PM  For on call review [email or web address].   This document was prepared using Dragon voice recognition software and may contain some unintended transcription errors.

## 2024-01-15 NOTE — ED Notes (Signed)
 Patient transported to CT

## 2024-01-16 ENCOUNTER — Inpatient Hospital Stay (HOSPITAL_COMMUNITY)

## 2024-01-16 DIAGNOSIS — R0609 Other forms of dyspnea: Secondary | ICD-10-CM | POA: Diagnosis not present

## 2024-01-16 DIAGNOSIS — R9431 Abnormal electrocardiogram [ECG] [EKG]: Secondary | ICD-10-CM | POA: Diagnosis not present

## 2024-01-16 DIAGNOSIS — D696 Thrombocytopenia, unspecified: Secondary | ICD-10-CM

## 2024-01-16 DIAGNOSIS — D649 Anemia, unspecified: Secondary | ICD-10-CM | POA: Diagnosis not present

## 2024-01-16 DIAGNOSIS — R7401 Elevation of levels of liver transaminase levels: Secondary | ICD-10-CM

## 2024-01-16 DIAGNOSIS — B179 Acute viral hepatitis, unspecified: Secondary | ICD-10-CM | POA: Diagnosis not present

## 2024-01-16 LAB — COMPREHENSIVE METABOLIC PANEL WITH GFR
ALT: 434 U/L — ABNORMAL HIGH (ref 0–44)
AST: 535 U/L — ABNORMAL HIGH (ref 15–41)
Albumin: 2.8 g/dL — ABNORMAL LOW (ref 3.5–5.0)
Alkaline Phosphatase: 49 U/L (ref 38–126)
Anion gap: 9 (ref 5–15)
BUN: 15 mg/dL (ref 8–23)
CO2: 21 mmol/L — ABNORMAL LOW (ref 22–32)
Calcium: 8.3 mg/dL — ABNORMAL LOW (ref 8.9–10.3)
Chloride: 104 mmol/L (ref 98–111)
Creatinine, Ser: 1.29 mg/dL — ABNORMAL HIGH (ref 0.61–1.24)
GFR, Estimated: 57 mL/min — ABNORMAL LOW (ref >60)
Glucose, Bld: 107 mg/dL — ABNORMAL HIGH (ref 70–99)
Potassium: 3.7 mmol/L (ref 3.5–5.1)
Sodium: 134 mmol/L — ABNORMAL LOW (ref 135–145)
Total Bilirubin: 0.9 mg/dL (ref 0.0–1.2)
Total Protein: 7 g/dL (ref 6.5–8.1)

## 2024-01-16 LAB — CBC
HCT: 35.6 % — ABNORMAL LOW (ref 39.0–52.0)
Hemoglobin: 11.6 g/dL — ABNORMAL LOW (ref 13.0–17.0)
MCH: 29.4 pg (ref 26.0–34.0)
MCHC: 32.6 g/dL (ref 30.0–36.0)
MCV: 90.4 fL (ref 80.0–100.0)
Platelets: 129 10*3/uL — ABNORMAL LOW (ref 150–400)
RBC: 3.94 MIL/uL — ABNORMAL LOW (ref 4.22–5.81)
RDW: 14.5 % (ref 11.5–15.5)
WBC: 5.2 10*3/uL (ref 4.0–10.5)
nRBC: 0 % (ref 0.0–0.2)

## 2024-01-16 LAB — ECHOCARDIOGRAM COMPLETE
AR max vel: 2.9 cm2
AV Peak grad: 6 mmHg
Ao pk vel: 1.22 m/s
Area-P 1/2: 3.81 cm2
Height: 68 in
MV VTI: 3.09 cm2
S' Lateral: 2.77 cm
Weight: 3280 [oz_av]

## 2024-01-16 LAB — LIPASE, BLOOD: Lipase: 640 U/L — ABNORMAL HIGH (ref 11–51)

## 2024-01-16 LAB — CK: Total CK: 864 U/L — ABNORMAL HIGH (ref 49–397)

## 2024-01-16 LAB — HIV ANTIBODY (ROUTINE TESTING W REFLEX): HIV Screen 4th Generation wRfx: NONREACTIVE

## 2024-01-16 LAB — PROTIME-INR
INR: 1.6 — ABNORMAL HIGH (ref 0.8–1.2)
Prothrombin Time: 19.7 s — ABNORMAL HIGH (ref 11.4–15.2)

## 2024-01-16 LAB — AMMONIA: Ammonia: 35 umol/L (ref 9–35)

## 2024-01-16 MED ORDER — IBUPROFEN 200 MG PO TABS
400.0000 mg | ORAL_TABLET | Freq: Once | ORAL | Status: AC
  Administered 2024-01-16: 400 mg via ORAL
  Filled 2024-01-16: qty 2

## 2024-01-16 MED ORDER — ACETAMINOPHEN 325 MG PO TABS: 650.0000 mg | ORAL_TABLET | Freq: Four times a day (QID) | ORAL | Status: DC | PRN

## 2024-01-16 MED ORDER — PERFLUTREN LIPID MICROSPHERE
1.0000 mL | INTRAVENOUS | Status: AC | PRN
  Administered 2024-01-16: 2 mL via INTRAVENOUS

## 2024-01-16 NOTE — TOC Initial Note (Signed)
 Transition of Care [facility]) - Initial/Assessment Note    Patient Details  Name: Christopher Mcfarland MRN: [id number] Date of Birth: 06/12/1946  Transition of Care [facility]) CM/SW Contact:    Ruben Corolla, RN Phone Number: 01/16/2024, 11:08 AM  Clinical Narrative: d/c plan home.                  Expected Discharge Plan: Home/Self Care Barriers to Discharge: Continued Medical Work up   Patient Goals and CMS Choice Patient states their goals for this hospitalization and ongoing recovery are:: Home CMS Medicare.gov Compare Post Acute Care list provided to:: Patient   [place] ownership interest in [facility].provided to:: Patient    Expected Discharge Plan and Services                                              Prior Living Arrangements/Services                       Activities of Daily Living   ADL Screening (condition at time of admission) Independently performs ADLs?: Yes (appropriate for developmental age) Is the patient deaf or have difficulty hearing?: No Does the patient have difficulty seeing, even when wearing glasses/contacts?: No Does the patient have difficulty concentrating, remembering, or making decisions?: No  Permission Sought/Granted                  Emotional Assessment              Admission diagnosis:  Acute hepatitis [B17.9] Elevated liver enzymes [R74.8] AKI (acute kidney injury) (HCC) [N17.9] Community acquired pneumonia, unspecified laterality [J18.9] Sepsis, due to unspecified organism, unspecified whether acute organ dysfunction present [facility]) [A41.9] Patient Active Problem List   Diagnosis Date Noted   Acute hepatitis 01/15/2024   Hyponatremia 01/15/2024   AKI (acute kidney injury) (HCC) 01/15/2024   Metabolic acidosis, increased anion gap 01/15/2024   Elevated CK 01/15/2024   Hyperproteinemia 01/15/2024   Thrombocytopenia (HCC) 01/15/2024   Left lower lobe pulmonary infiltrate 01/15/2024   Elevated  lipase 01/15/2024   Elevated coronary artery calcium  score 08/28/2022   Coronary artery calcification 08/27/2022   Left lumbar radiculopathy 10/19/2021   Cough 06/07/2021   Trigger middle finger of left hand 09/20/2020   Gout 01/02/2020   Epistaxis 11/13/2019   Right lumbar radiculitis 07/23/2019   Neck pain, musculoskeletal 06/27/2019   Erectile dysfunction 03/11/2019   Vitamin D  deficiency 03/11/2019   Hyperglycemia 03/07/2019   Vasomotor rhinitis 12/20/2017   Laryngopharyngeal reflux (LPR) 10/25/2017   Encounter for well adult exam with abnormal findings 11/22/2016   Medicare annual wellness visit, initial 11/22/2016   Osteoarthritis of both hands 09/02/2016   Essential hypertension 09/02/2016   Hyperlipidemia 09/02/2016   Liver mass 01/08/2016   Liver fibrosis 01/08/2016   NEUTROPENIA UNSPECIFIED 09/01/2009   COCAINE ABUSE 09/01/2009   Chronic hepatitis C without hepatic coma (HCC) 08/15/2009   TRIGGER FINGER 08/15/2009   URINALYSIS, ABNORMAL 08/15/2009   History of colonic polyps 08/15/2009   PCP:  Roslyn Coombe, MD Pharmacy:   [facility] [phone] [place], [place] - [address] AT [facility] [address] [place] [address] Phone: [phone] Fax: [phone]     Social Drivers of Health (SDOH) Social History: SDOH Screenings   Food Insecurity: No Food Insecurity (01/15/2024)  Housing:  Low Risk  (01/15/2024)  Transportation Needs: No Transportation Needs (01/15/2024)  Utilities: Not At Risk (01/15/2024)  Alcohol Screen: Low Risk  (08/02/2023)  Depression (PHQ2-9): Low Risk  (11/07/2023)  Financial Resource Strain: Low Risk  (08/02/2023)  Physical Activity: Sufficiently Active (08/02/2023)  Social Connections: Moderately Integrated (01/15/2024)  Stress: No Stress Concern Present (08/02/2023)  Tobacco Use: Low Risk  (01/15/2024)  Health Literacy: Adequate Health Literacy (08/02/2023)   SDOH Interventions:     Readmission Risk  Interventions     No data to display

## 2024-01-16 NOTE — Plan of Care (Signed)

## 2024-01-16 NOTE — Progress Notes (Signed)
 Mobility Specialist - Progress Note   01/16/24 1109  Mobility  Activity Ambulated independently in hallway  Level of Assistance Standby assist, set-up cues, supervision of patient - no hands on  Assistive Device None  Distance Ambulated (ft) 200 ft  Range of Motion/Exercises Active  Activity Response Tolerated well  Mobility Referral Yes  Mobility visit 1 Mobility  Mobility Specialist Start Time (ACUTE ONLY) 1059  Mobility Specialist Stop Time (ACUTE ONLY) 1109  Mobility Specialist Time Calculation (min) (ACUTE ONLY) 10 min   Pt was found in bed and agreeable to ambulate. No complaints and returned to bed with all needs met. Call bell in reach.  Lorna Rose Mobility Specialist

## 2024-01-16 NOTE — Evaluation (Signed)
 Physical Therapy Evaluation Patient Details Name: Christopher Mcfarland MRN: [id number] DOB: [birth year] Today's Date: 01/16/2024  History of Present Illness  78 yo male admitted with elevated LFTs, hyponatremia, AKI, Pna. Hx of Hep C, gout, obesity, lumbar radiculopathy  Clinical Impression  On eval, pt was mostly CGA for mobility. LOB x 1 during ambulation. Tolerated activity well. Denied dizziness. Will plan to follow and progress activity as tolerated. Recommend daily hallway ambulation as able with staff.         If plan is discharge home, recommend the following: A little help with walking and/or transfers;A little help with bathing/dressing/bathroom;Assistance with cooking/housework;Assist for transportation;Help with stairs or ramp for entrance   Can travel by private vehicle        Equipment Recommendations None recommended by PT  Recommendations for Other Services       Functional Status Assessment Patient has had a recent decline in their functional status and demonstrates the ability to make significant improvements in function in a reasonable and predictable amount of time.     Precautions / Restrictions Precautions Precautions: Fall Restrictions Weight Bearing Restrictions Per Provider Order: No      Mobility  Bed Mobility Overal bed mobility: Modified Independent                  Transfers Overall transfer level: Modified independent                      Ambulation/Gait Ambulation/Gait assistance: Contact guard assist Gait Distance (Feet): 125 Feet Assistive device: None Gait Pattern/deviations: Step-through pattern, Decreased stride length       General Gait Details: LOB x 1 which pt was pretty much able to self correct. Tolerated distance well. No dizziness reported.  Stairs            Wheelchair Mobility     Tilt Bed    Modified Rankin (Stroke Patients Only)       Balance Overall balance assessment: Needs assistance          Standing balance support: During functional activity Standing balance-Leahy Scale: Fair                               Pertinent Vitals/Pain Pain Assessment Pain Assessment: No/denies pain    Home Living Family/patient expects to be discharged to:: Private residence Living Arrangements: Spouse/significant other Available Help at Discharge: Family Type of Home: House Home Access: Stairs to enter       Home Layout: One level Home Equipment: None      Prior Function Prior Level of Function : Independent/Modified Independent                     Extremity/Trunk Assessment   Upper Extremity Assessment Upper Extremity Assessment: Overall WFL for tasks assessed    Lower Extremity Assessment Lower Extremity Assessment: Generalized weakness    Cervical / Trunk Assessment Cervical / Trunk Assessment: Normal  Communication   Communication Communication: No apparent difficulties    Cognition Arousal: Alert Behavior During Therapy: WFL for tasks assessed/performed   PT - Cognitive impairments: No apparent impairments                         Following commands: Intact       Cueing Cueing Techniques: Verbal cues     General Comments      Exercises  Assessment/Plan    PT Assessment Patient needs continued PT services  PT Problem List Decreased strength;Decreased range of motion;Decreased balance;Decreased activity tolerance;Decreased mobility;Pain       PT Treatment Interventions Gait training;Functional mobility training;Therapeutic activities;Therapeutic exercise;Stair training;Patient/family education;Balance training    PT Goals (Current goals can be found in the Care Plan section)  Acute Rehab PT Goals Patient Stated Goal: home. regain ind/plof PT Goal Formulation: With patient Time For Goal Achievement: 01/30/24 Potential to Achieve Goals: Good    Frequency Min 3X/week     Co-evaluation                AM-PAC PT "6 Clicks" Mobility  Outcome Measure Help needed turning from your back to your side while in a flat bed without using bedrails?: None Help needed moving from lying on your back to sitting on the side of a flat bed without using bedrails?: None Help needed moving to and from a bed to a chair (including a wheelchair)?: None Help needed standing up from a chair using your arms (e.g., wheelchair or bedside chair)?: None Help needed to walk in hospital room?: A Little Help needed climbing 3-5 steps with a railing? : A Little 6 Click Score: 22    End of Session Equipment Utilized During Treatment: Gait belt Activity Tolerance: Patient tolerated treatment well Patient left: in chair;with call bell/phone within reach   PT Visit Diagnosis: Muscle weakness (generalized) (M62.81);Unsteadiness on feet (R26.81)    Time: 4098-1191 PT Time Calculation (min) (ACUTE ONLY): 10 min   Charges:   PT Evaluation $PT Eval Low Complexity: 1 Low   PT General Charges $$ ACUTE PT VISIT: 1 Visit            Tanda Falter, PT Acute Rehabilitation  Office: [phone]

## 2024-01-16 NOTE — Consult Note (Addendum)
 Referring Provider: Dr. Lucienne Ryder Primary Care Physician:  Roslyn Coombe, MD Primary Gastroenterologist:  Dr. Lorella Roles   Reason for Consultation:  Acute transaminitis   HPI: Christopher Mcfarland is a 78 y.o. male with a past medical history of hypertension, hyperlipidemia, gout, lumbar radiculopathy, chronic hepatitis C GT1a treated with Harvoni  with SVR 2017 and colon polyps. He presented to the ED 01/15/2024 with a cough and generalized weakness x 1 week and fever x 4 days. Temp 101.73F. Tachycardic. Labs in the ED showed a WBC count of 7.3. Hg 13.6. HCT 39.3. PLT 145. Na+ 132. K 4.1. BUN 21. Cr 1.82. GFR 38. T. Bili 1.0. Alk phos 73. AST 1,162. ALT 721. Lactic acid 1.2. CK 1,081. Hep A IgM nonreactive. Hep B surface antigen nonreactive. Hep C antibody positive. INR 1.7. Lipase 605. UDS, Acetaminophen  and alcohol levels not done. Blood cultures pending. SARS Coronavirus 2 negative. Influenza A and B negative. RSV negative. Strep pneumoniae urine antigen negative. Moderate urine Hg and urine protein 100. Chest xray showed left sided alveolar process concerning for pneumonia. RUQ sonogram showed a normal gallbladder without biliary ductal dilatation, hepatic steatosis, a 0.9 x 0.8 cm lesion in the right liver lobe, possible hemangioma and a 0.1 x 0.6 cm hypoechoic structure, possible small cyst. CTAP without contrast showed a normal liver and pancreas and pulmonary airspace opacity in the lingula, suspicious for pneumonia.  A GI consult was requested for further evaluation regarding acute transaminitis.  Labs today: CMP pending.  Lipase pending.  Hemoglobin 11.6.  Platelet 129.  INR 1.6.  CK pending.  He developed weakness in his legs 1 week ago then noting having fevers at home for the past 3 to 4 days. No nausea or vomiting. No abdominal pain.  He typically passes normal formed brown stool daily, however, he's been passing nonbloody loose stools several times daily for the past 4 to 5 days.  Nonproductive cough for the past 1 to 2 weeks. He denies taking any Tylenol  or NSAIDs at home. Denies history of heavy alcohol use. Last drank 2 mixed cocktails 1 month ago. No confusion. No jaundice. No pruritus. Urine has been normal yellow color. No new medications within the past 6 months. No recent overseas travel. He stated he is usually very active and 2 weeks ago he was mowing his lawn and going to the gym several days weekly. He lives with his wife, she has not exhibited any similar symptoms.  He has a history of chronic hepatitis C genotype 1a confirmed by liver biopsy in 2005 and subsequently treated with Harvoni  x 12 weeks with SVR per infectious disease specialist Dr. Emmalene Hare 2017. Past history of snorting drugs. History of past hepatitis B exposure with Negative Hep B surface antigen, positive Hep B core total antibody and positive Hep B surface antibody 08/2015.  Since being treated for chronic hepatitis C, he denies any high risk behaviors. On Rosuvastatin  40mg  every day for at least the past 5 years. No known family history liver disease.  He was previously seen in our GI clinic by Dr. Dominic Friendly 10/13/2017 for possible GERD treated with PPI.   GI PROCEDURES:   Colonoscopy 06/12/2014 by Dr. Arvie Latus (personal history of colonic polyps 1999):  1. Mild diverticulosis was noted in the descending colon and sigmoid colon  2. The examination was otherwise normal   Liver biopsy 10/29/2003: LIVER, NEEDLE CORE BIOPSIES: CHRONIC MILDLY ACTIVE HEPATITIS.    COMMENT   Some of the portal areas  are expanded by lymphoplasmacytic   infiltrates associated with interface hepatitis and mild lobular   activity. There is mild, patchy hepatocyte steatosis. No   increased iron is identified with iron stain and no increased   fibrosis is identified with trichrome. The iron and trichrome   controls stained appropriately. The findings are consistent with   chronic mildly active hepatitis C, inflammatory  grade 2 (mild)   and fibrosis stage 0   Past Medical History:  Diagnosis Date   Essential hypertension    Gout 01/02/2020   Gout crystals on joint fluid April 2021   Hepatitis C 2003   Treated   Hyperlipidemia     Past Surgical History:  Procedure Laterality Date   CARPAL TUNNEL RELEASE     bilat 1999   HEMORRHOID SURGERY     1999/4 polyps removed rectum   TRIGGER FINGER RELEASE     right hand 1999    Prior to Admission medications   Medication Sig Start Date End Date Taking? Authorizing Provider  allopurinol  (ZYLOPRIM ) 300 MG tablet TAKE 1 TABLET(300 MG) BY MOUTH DAILY 11/07/23  Yes Roslyn Coombe, MD  amLODipine  (NORVASC ) 5 MG tablet TAKE 1 TABLET(5 MG) BY MOUTH DAILY 11/07/23  Yes Roslyn Coombe, MD  aspirin  EC 81 MG tablet Take 1 tablet (81 mg total) by mouth daily. Swallow whole. 08/25/22  Yes Roslyn Coombe, MD  Cholecalciferol  (VITAMIN D3) 50 MCG (2000 UT) TABS Take 2,000 Units by mouth daily.   Yes [provider]  ezetimibe  (ZETIA ) 10 MG tablet Take 1 tablet (10 mg total) by mouth daily. 11/07/23  Yes Roslyn Coombe, MD  ipratropium (ATROVENT ) 0.06 % nasal spray Place 2 sprays into both nostrils 3 (three) times daily as needed for rhinitis. 07/07/19  Yes [provider]  rosuvastatin  (CRESTOR ) 40 MG tablet Take 1 tablet (40 mg total) by mouth daily. 11/07/23  Yes Roslyn Coombe, MD  Cholecalciferol  50 MCG (2000 UT) TABS 1 tab by mouth once daily Patient not taking: Reported on 01/15/2024 06/08/21   Roslyn Coombe, MD  diclofenac  Sodium (VOLTAREN ) 1 % GEL Apply 4 g topically 4 (four) times daily. Patient not taking: Reported on 01/15/2024 12/22/19   Albertus Hughs, DO  gabapentin  (NEURONTIN ) 300 MG capsule Take 1 capsule (300 mg total) by mouth 3 (three) times daily as needed. Patient not taking: Reported on 01/15/2024 08/04/23   Syliva Even, MD  predniSONE  (DELTASONE ) 50 MG tablet Take 1 tablet (50 mg total) by mouth daily. Patient not taking: Reported on 01/15/2024  08/04/23   Syliva Even, MD  promethazine -dextromethorphan (PROMETHAZINE -DM) 6.25-15 MG/5ML syrup Take 5 mLs by mouth 4 (four) times daily as needed for cough. Patient not taking: Reported on 01/15/2024 06/08/23   Roslyn Coombe, MD  tadalafil  (CIALIS ) 20 MG tablet Take 0.5-1 tablets (10-20 mg total) by mouth every other day as needed for erectile dysfunction. Patient not taking: Reported on 01/15/2024 08/25/22   Roslyn Coombe, MD    Current Facility-Administered Medications  Medication Dose Route Frequency Provider Last Rate Last Admin   0.9 %  sodium chloride  infusion   Intravenous Continuous Danice Dural, MD 125 mL/hr at 01/16/24 0431 Infusion Verify at 01/16/24 0431   azithromycin (ZITHROMAX) 500 mg in sodium chloride  0.9 % 250 mL IVPB  500 mg Intravenous Q24H Danice Dural, MD       cefTRIAXone (ROCEPHIN) 2 g in sodium chloride  0.9 % 100 mL IVPB  2 g  Intravenous Q24H Danice Dural, MD       HYDROmorphone (DILAUDID) injection 0.5 mg  0.5 mg Intravenous Q2H PRN Danice Dural, MD       ondansetron (ZOFRAN) tablet 4 mg  4 mg Oral Q6H PRN Danice Dural, MD       Or   ondansetron (ZOFRAN) injection 4 mg  4 mg Intravenous Q6H PRN Danice Dural, MD        Allergies as of 01/15/2024 - Review Complete 01/15/2024  Allergen Reaction Noted   Other Other (See Comments) 01/15/2024    Family History  Problem Relation Age of Onset   CAD Brother 7   Kidney cancer Mother    Bone cancer Father    Colon cancer Neg Hx    Pancreatic cancer Neg Hx    Rectal cancer Neg Hx    Stomach cancer Neg Hx     Social History   Socioeconomic History   Marital status: Married    Spouse name: Not on file   Number of children: 4   Years of education: 12   Highest education level: Not on file  Occupational History   Occupation: Education administrator  Tobacco Use   Smoking status: Never   Smokeless tobacco: Never  Vaping Use   Vaping status: Never Used  Substance and Sexual Activity    Alcohol use: Not Currently    Alcohol/week: 2.0 - 3.0 standard drinks of alcohol    Types: 2 - 3 Shots of liquor per week   Drug use: No    Comment: smoked marijuana "years ago"   Sexual activity: Yes  Other Topics Concern   Not on file  Social History Narrative   Fun: Play pool, yard work, gym, and Curator   Social Drivers of Health   Financial Resource Strain: Low Risk  (08/02/2023)   Overall Financial Resource Strain (CARDIA)    Difficulty of Paying Living Expenses: Not hard at all  Food Insecurity: No Food Insecurity (01/15/2024)   Hunger Vital Sign    Worried About Running Out of Food in the Last Year: Never true    Ran Out of Food in the Last Year: Never true  Transportation Needs: No Transportation Needs (01/15/2024)   PRAPARE - Administrator, Civil Service (Medical): No    Lack of Transportation (Non-Medical): No  Physical Activity: Sufficiently Active (08/02/2023)   Exercise Vital Sign    Days of Exercise per Week: 4 days    Minutes of Exercise per Session: 50 min  Stress: No Stress Concern Present (08/02/2023)   [employer] of Occupational Health - Occupational Stress Questionnaire    Feeling of Stress : Not at all  Social Connections: Moderately Integrated (01/15/2024)   Social Connection and Isolation Panel [NHANES]    Frequency of Communication with Friends and Family: More than three times a week    Frequency of Social Gatherings with Friends and Family: More than three times a week    Attends Religious Services: More than 4 times per year    Active Member of [employer] or Organizations: No    Attends Banker Meetings: Never    Marital Status: Married  Catering manager Violence: Not At Risk (01/15/2024)   Humiliation, Afraid, Rape, and Kick questionnaire    Fear of Current or Ex-Partner: No    Emotionally Abused: No    Physically Abused: No    Sexually Abused: No    Review of Systems: Gen: Denies fever, sweats or  chills. No weight loss.   CV: Denies chest pain, palpitations or edema. Resp: Denies cough, shortness of breath of hemoptysis.  GI: See HPI. GU : Denies urinary burning, blood in urine, increased urinary frequency or incontinence. MS + Muscle weakness. Derm: Denies rash, itchiness, skin lesions or unhealing ulcers. Psych: Denies depression, anxiety, memory loss or confusion. Heme: Denies easy bruising, bleeding. Neuro:  Denies headaches, dizziness or paresthesias. Endo:  Denies any problems with DM, thyroid  or adrenal function.  Physical Exam: Vital signs in last 24 hours: Temp:  [98.7 F (37.1 C)-103.1 F (39.5 C)] 99.3 F (37.4 C) (05/05 0323) Pulse Rate:  [94-124] 94 (05/05 0323) Resp:  [16-26] 18 (05/05 0323) BP: (113-147)/(60-85) 125/78 (05/05 0323) SpO2:  [90 %-98 %] 97 % (05/05 0323) Weight:  [93 kg] 93 kg (05/04 0842) Last BM Date : 01/15/24 General:  Alert, well-developed, well-nourished, pleasant and cooperative in NAD. Head:  Normocephalic and atraumatic. Eyes:  No scleral icterus. Conjunctiva pink. Ears:  Normal auditory acuity. Nose:  No deformity, discharge or lesions. Mouth:  Dentition intact. No ulcers or lesions.  Neck:  Supple. No lymphadenopathy or thyromegaly.  Lungs: Breath sounds clear, decreased in the left lower base.  No wheezes or rhonchi. Heart: Rate and rhythm, no murmurs. Abdomen: Protuberant soft.  Nontender.  Positive bowel sounds all 4 quadrants.  No hepatosplenomegaly.  No ascites.  No palpable mass. Rectal: Deferred. Musculoskeletal:  Symmetrical without gross deformities.  Pulses:  Normal pulses noted. Extremities:  Without clubbing or edema. Neurologic:  Alert and  oriented x 4. No focal deficits.  Skin:  Intact without significant lesions or rashes.  No jaundice. Psych:  Alert and cooperative. Normal mood and affect.  Intake/Output from previous day: 05/04 0701 - 05/05 0700 In: 4106.5 [I.V.:1014.8; IV Piggyback:3091.7] Out: -  Intake/Output this shift: No  intake/output data recorded.  Lab Results: Recent Labs    01/15/24 0858 01/16/24 0459  WBC 7.3 5.2  HGB 13.6 11.6*  HCT 39.3 35.6*  PLT 145* 129*   BMET Recent Labs    01/15/24 0858  NA 132*  K 4.1  CL 92*  CO2 20*  GLUCOSE 117*  BUN 21  CREATININE 1.82*  CALCIUM  9.8   LFT Recent Labs    01/15/24 0858  PROT 8.6*  ALBUMIN 4.1  AST 1,162*  ALT 721*  ALKPHOS 73  BILITOT 1.0   PT/INR Recent Labs    01/15/24 1115 01/16/24 0459  LABPROT 19.9* 19.7*  INR 1.7* 1.6*   Hepatitis Panel Recent Labs    01/15/24 1115  HEPBSAG NON REACTIVE  HCVAB Reactive*  HEPAIGM NON REACTIVE  HEPBIGM NON REACTIVE   Labs 08/2025: Negative Hep B surface antigen, positive Hep B core total antibody and positive Hep B surface antibody    Studies/Results: US  Abdomen Limited RUQ (LIVER/GB) Result Date: 01/15/2024 CLINICAL DATA:  Elevated LFTs. EXAM: ULTRASOUND ABDOMEN LIMITED RIGHT UPPER QUADRANT COMPARISON:  CT from earlier today FINDINGS: Gallbladder: The gallbladder wall thickness is upper limits of normal at 2.9 mm. No gallstones, gallbladder sludge or pericholecystic fluid. Negative sonographic Murphy's sign. Common bile duct: Diameter: Nondilated measuring approximately 2 mm. No intrahepatic bile duct dilatation. Liver: Increased parenchymal echogenicity. Tiny hypoechoic structure within the dome of the lateral segment of left lobe measures 0.1 x 0.6 cm. Small echogenic structure within the right lobe of liver measures 0.9 x 0.8 cm. Portal vein is patent on color Doppler imaging with normal direction of blood flow towards the liver. Other: None. IMPRESSION:  1. No acute findings. 2. Increased hepatic parenchymal echogenicity compatible with fatty infiltration. 3. Small echogenic structure within the right lobe of liver measures 0.9 x 0.8 cm. This is nonspecific but may represent a small hemangioma. 4. Tiny hypoechoic structure within the dome of the lateral segment of left lobe measures 0.1  x 0.6 cm. This is nonspecific but may represent a small cyst. Electronically Signed   By: Kimberley Penman M.D.   On: 01/15/2024 11:28   CT ABDOMEN PELVIS W CONTRAST Result Date: 01/15/2024 CLINICAL DATA:  Acute abdominal pain and weakness for 1 week. EXAM: CT ABDOMEN AND PELVIS WITH CONTRAST TECHNIQUE: Multidetector CT imaging of the abdomen and pelvis was performed using the standard protocol following bolus administration of intravenous contrast. RADIATION DOSE REDUCTION: This exam was performed according to the departmental dose-optimization program which includes automated exposure control, adjustment of the mA and/or kV according to patient size and/or use of iterative reconstruction technique. CONTRAST:  75mL OMNIPAQUE IOHEXOL 300 MG/ML  SOLN COMPARISON:  None Available. FINDINGS: Lower Chest: Pulmonary airspace opacity with air bronchograms is seen in the lingula, suspicious for pneumonia. Hepatobiliary: No suspicious hepatic masses identified. Gallbladder is unremarkable. No evidence of biliary ductal dilatation. Pancreas:  No mass or inflammatory changes. Spleen: Within normal limits in size and appearance. Adrenals/Urinary Tract: No suspicious masses identified. No evidence of ureteral calculi or hydronephrosis. Stomach/Bowel: No evidence of obstruction, inflammatory process or abnormal fluid collections. Normal appendix visualized. Vascular/Lymphatic: No pathologically enlarged lymph nodes. No acute vascular findings. Reproductive:  No mass or other significant abnormality. Other:  None. Musculoskeletal:  No suspicious bone lesions identified. IMPRESSION: No acute findings within the abdomen or pelvis. Pulmonary airspace opacity in the lingula, suspicious for pneumonia. Electronically Signed   By: Marlyce Sine M.D.   On: 01/15/2024 10:59   DG Chest Port 1 View Result Date: 01/15/2024 CLINICAL DATA:  Questionable sepsis - evaluate for abnormality EXAM: PORTABLE CHEST - 1 VIEW COMPARISON:  06/08/2021.  FINDINGS: Cardiac silhouette is unremarkable. No pneumothorax. No pleural effusion. Calcified aorta. The visualized skeletal structures are unremarkable. Alveolar opacity consistent with left mid lungpneumonia versus volume loss. Follow up recommended to confirm resolution as a precaution. IMPRESSION: Left-sided alveolar process may represent pneumonia. Follow-up after therapy recommended to confirm resolution. Electronically Signed   By: Sydell Eva M.D.   On: 01/15/2024 09:29    IMPRESSION/PLAN:  78 year old male with a past history of chronic hepatitis C GT1a treated with Harvoni  x 12 weeks with SVR 2017 and past hepatitis B exposure (negative Hep B surface antigen, positive Hep B core total antibody and positive Hep B surface antibody 08/2015) admitted 01/15/2024 with generalized weakness, fever and acute transaminitis. Suspect viral/infectious hepatitis. T.  Bili 1.0. Alk phos 73. AST 1,162. ALT 721. Lipase 605. Hep A IgM nonreactive. Hep B core IgM nonreactive. Hep B surface antigen nonreactive. Hep C antibody positive (HCV antibody will always remain positive). CK 1,081. INR 1.7 -> 1.6. RUQ sonogram showed a normal gallbladder without biliary ductal dilatation, hepatic steatosis, a 0.9 x 0.8 cm lesion in the right liver lobe, possible hemangioma and a 0.1 x 0.6 cm hypoechoic structure, possible small cyst. CTAP without contrast showed a normal liver and pancreas. -Await am LFT results -Hep C RNA, Hep B DNA, HIV, HSV, CMV IgM, EBV IgM -Daily PT/INR -Hepatic panel in am -Consider liver MRI during this hospitalization -Heart healthy diet -Avoid hepatotoxins -Continue to hold Rosuvastatin  -Await further recommendations per Dr.  Venice Gillis  Diarrhea -  GI pathogen panel  AKI, secondary to viral illness +/- rhabdomyolysis. Cr 1.81. - IV fluids per the hospitalist  Elevated CK, possible rhabdomyolysis  -Await am CK level -IV fluids per the hospitalist  LLL pneumonia, on Azithromycin and  Ceftriaxone IV.  SARS coronavirus 2, RSV and influenza A/B-.  Pneumonia urinary antigen negative. - Management per the hospitalist  Normocytic anemia, dilutional component secondary to IV fluids. No overt GI bleeding. Hg 13.6 -> 11.6.   Thrombocytopenia, secondary to acute hepatitis. PLT 145 -> 129.  CTAP with contrast showed a normal liver and spleen.        Tory Freiberg  01/16/2024, 8:43 AM    Attending physician's note   I have taken history, reviewed the chart and examined the patient. I performed a substantive portion of this encounter, including complete performance of at least one of the key components, in conjunction with the APP. I agree with the Advanced Practitioner's note, impression and recommendations.   Abn AST/ALT- ?etiology.  Viral hepatitis vs DILI.  Doubt ischemic.  Neg US , CT except hepatic steatosis with a 0.9 cm right liver lobe lesion-likely hemangioma.  LFTs trending down well. H/O chronic hepatitis C (S/P Rx with Harvoni  with SVR). No liver cirrhosis. LLQ pneumonia on azithromycin/ceftriaxone.   Plan: - Advance diet. - Check Tylenol  level. - Agree to hold Crestor . - Check hepatitis serology including CMV/EBV. - Check AFP. - Trend LFTs. - Would like to hold off on any further GI workup this adm. if AFP is elevated, then MRI. - D/W pt and family   Magnus Schuller, MD [facility] [phone]

## 2024-01-16 NOTE — Progress Notes (Signed)
 PROGRESS NOTE    Christopher Mcfarland  OZD:[id number] DOB: 15-Jul-1946 DOA: 01/15/2024 PCP: Roslyn Coombe, MD    Brief Narrative:  Christopher Mcfarland is a 78 y.o. male with medical history significant of essential hypertension, gout, treated hepatitis C, history of liver mass, hyperlipidemia, class I obesity, left lumbar radiculopathy, coronary artery calcification who presented to the emergency department with complaints of generalized weakness for about a week associated with daily fever for the last 4 days and abdominal cramping.   Assessment and Plan: Elevated LFTs -Statin use and History of hep C. Intake alcohol x 2 last Monday. -GI consulted- may need liver MRI      Elevated lipase  Normal pancreas on imaging. -trend     Left lower lobe pulmonary infiltrate- PNA Continue ceftriaxone 1 g IVPB daily. Continue azithromycin 500 mg IVPB daily. Check strep pneumoniae urinary antigen. Check sputum Gram stain, culture and sensitivity.    AKI (acute kidney injury) (HCC) Continue IV fluids. Avoid hypotension. Avoid nephrotoxins. Monitor intake and output. Monitor renal function electrolytes.     Hyponatremia Due to poor oral intake. Follow sodium level in the morning.     Elevated CK In the setting of statin use. -trending down on IVF     Hyperproteinemia Secondary to hemoconcentration. -improved with IVF     Thrombocytopenia (HCC) -trend     Essential hypertension Continue amlodipine  5 mg p.o. daily.     Hyperlipidemia Hold rosuvastatin .       Coronary artery calcification Holding aspirin  for now. Follow-up with PCP or cardiology as an outpatient.      DVT prophylaxis: SCDs Start: 01/15/24 1544    Code Status: Full Code   Disposition Plan:  Level of care: Progressive Status is: Inpatient     Consultants:  GI   Subjective: + cough but not productive  Objective: Vitals:   01/15/24 2248 01/16/24 0000 01/16/24 0323 01/16/24 0907  BP:  115/60  125/78 131/77  Pulse: (!) 101 (!) 102 94 95  Resp:  18 18 18   Temp: (!) 100.6 F (38.1 C) 98.9 F (37.2 C) 99.3 F (37.4 C) 100.3 F (37.9 C)  TempSrc: Oral Oral Oral Oral  SpO2: 93% 95% 97% 94%  Weight:      Height:        Intake/Output Summary (Last 24 hours) at 01/16/2024 1041 Last data filed at 01/16/2024 0431 Gross per 24 hour  Intake 3006.48 ml  Output --  Net 3006.48 ml   Filed Weights   01/15/24 0842  Weight: 93 kg    Examination:   General: Appearance:    Obese male in no acute distress     Lungs:     respirations unlabored  Heart:    Normal heart rate. .    MS:   All extremities are intact.    Neurologic:   Awake, alert- pleasant and cooperative       Data Reviewed: I have personally reviewed following labs and imaging studies  CBC: Recent Labs  Lab 01/15/24 0858 01/16/24 0459  WBC 7.3 5.2  NEUTROABS 4.7  --   HGB 13.6 11.6*  HCT 39.3 35.6*  MCV 85.2 90.4  PLT 145* 129*   Basic Metabolic Panel: Recent Labs  Lab 01/15/24 0858 01/16/24 0459  NA 132* 134*  K 4.1 3.7  CL 92* 104  CO2 20* 21*  GLUCOSE 117* 107*  BUN 21 15  CREATININE 1.82* 1.29*  CALCIUM  9.8 8.3*  MG 2.0  --  PHOS 2.9  --    GFR: Estimated Creatinine Clearance: 53.9 mL/min (A) (by C-G formula based on SCr of 1.29 mg/dL (H)). Liver Function Tests: Recent Labs  Lab 01/15/24 0858 01/16/24 0459  AST 1,162* 535*  ALT 721* 434*  ALKPHOS 73 49  BILITOT 1.0 0.9  PROT 8.6* 7.0  ALBUMIN 4.1 2.8*   Recent Labs  Lab 01/15/24 1115 01/16/24 0459  LIPASE 605* 640*   Recent Labs  Lab 01/16/24 0459  AMMONIA 35   Coagulation Profile: Recent Labs  Lab 01/15/24 1115 01/16/24 0459  INR 1.7* 1.6*   Cardiac Enzymes: Recent Labs  Lab 01/15/24 0858 01/16/24 0459  CKTOTAL 1,081* 864*   BNP (last 3 results) No results for input(s): "PROBNP" in the last 8760 hours. HbA1C: No results for input(s): "HGBA1C" in the last 72 hours. CBG: No results for input(s):  "GLUCAP" in the last 168 hours. Lipid Profile: No results for input(s): "CHOL", "HDL", "LDLCALC", "TRIG", "CHOLHDL", "LDLDIRECT" in the last 72 hours. Thyroid  Function Tests: No results for input(s): "TSH", "T4TOTAL", "FREET4", "T3FREE", "THYROIDAB" in the last 72 hours. Anemia Panel: No results for input(s): "VITAMINB12", "FOLATE", "FERRITIN", "TIBC", "IRON", "RETICCTPCT" in the last 72 hours. Sepsis Labs: Recent Labs  Lab 01/15/24 0858  LATICACIDVEN 1.2    Recent Results (from the past 240 hours)  Blood Culture (routine x 2)     Status: None (Preliminary result)   Collection Time: 01/15/24  8:00 AM   Specimen: BLOOD  Result Value Ref Range Status   Specimen Description   Final    BLOOD RIGHT ANTECUBITAL Performed at [facility], [address] [place], [place] [address]    Special Requests   Final    BOTTLES DRAWN AEROBIC AND ANAEROBIC Blood Culture results may not be optimal due to an inadequate volume of blood received in culture bottles Performed at [facility], [address] [place], [place] [address]    Culture   Final    NO GROWTH < 24 HOURS Performed at [facility], [address], [place], [place] [address]    Report Status PENDING  Incomplete  Blood Culture (routine x 2)     Status: None (Preliminary result)   Collection Time: 01/15/24  8:04 AM   Specimen: BLOOD  Result Value Ref Range Status   Specimen Description   Final    BLOOD RIGHT ANTECUBITAL Performed at [facility], [address] [place], [place] [address]    Special Requests   Final    BOTTLES DRAWN AEROBIC ONLY Blood Culture results may not be optimal due to an inadequate volume of blood received in culture bottles Performed at [facility], [address] [place], [place] [address]    Culture   Final    NO GROWTH < 24 HOURS Performed at [facility], [address] [place], [place] [address]    Report Status PENDING  Incomplete   Resp panel by RT-PCR (RSV, Flu A&B, Covid) Anterior Nasal Swab     Status: None   Collection Time: 01/15/24  8:29 AM   Specimen: Anterior Nasal Swab  Result Value Ref Range Status   SARS Coronavirus 2 by RT PCR NEGATIVE NEGATIVE Final    Comment: (NOTE) SARS-CoV-2 target nucleic acids are NOT DETECTED.  The SARS-CoV-2 RNA is generally detectable in upper respiratory specimens during the acute phase of infection. The lowest concentration of SARS-CoV-2 viral copies this assay  can detect is 138 copies/mL. A negative result does not preclude SARS-Cov-2 infection and should not be used as the sole basis for treatment or other patient management decisions. A negative result may occur with  improper specimen collection/handling, submission of specimen other than nasopharyngeal swab, presence of viral mutation(s) within the areas targeted by this assay, and inadequate number of viral copies(<138 copies/mL). A negative result must be combined with clinical observations, patient history, and epidemiological information. The expected result is Negative.  Fact Sheet for Patients:  BloggerCourse.com  Fact Sheet for Healthcare Providers:  SeriousBroker.it  This test is no t yet approved or cleared by the [place]  FDA and  has been authorized for detection and/or diagnosis of SARS-CoV-2 by FDA under an Emergency Use Authorization (EUA). This EUA will remain  in effect (meaning this test can be used) for the duration of the COVID-19 declaration under Section 564(b)(1) of the Act, 21 U.S.C.section 360bbb-3(b)(1), unless the authorization is terminated  or revoked sooner.       Influenza A by PCR NEGATIVE NEGATIVE Final   Influenza B by PCR NEGATIVE NEGATIVE Final    Comment: (NOTE) The Xpert Xpress SARS-CoV-2/FLU/RSV plus assay is intended as an aid in the diagnosis of influenza from Nasopharyngeal swab specimens and should not be used  as a sole basis for treatment. Nasal washings and aspirates are unacceptable for Xpert Xpress SARS-CoV-2/FLU/RSV testing.  Fact Sheet for Patients: BloggerCourse.com  Fact Sheet for Healthcare Providers: SeriousBroker.it  This test is not yet approved or cleared by the [place]  FDA and has been authorized for detection and/or diagnosis of SARS-CoV-2 by FDA under an Emergency Use Authorization (EUA). This EUA will remain in effect (meaning this test can be used) for the duration of the COVID-19 declaration under Section 564(b)(1) of the Act, 21 U.S.C. section 360bbb-3(b)(1), unless the authorization is terminated or revoked.     Resp Syncytial Virus by PCR NEGATIVE NEGATIVE Final    Comment: (NOTE) Fact Sheet for Patients: BloggerCourse.com  Fact Sheet for Healthcare Providers: SeriousBroker.it  This test is not yet approved or cleared by the [place]  FDA and has been authorized for detection and/or diagnosis of SARS-CoV-2 by FDA under an Emergency Use Authorization (EUA). This EUA will remain in effect (meaning this test can be used) for the duration of the COVID-19 declaration under Section 564(b)(1) of the Act, 21 U.S.C. section 360bbb-3(b)(1), unless the authorization is terminated or revoked.  Performed at [facility], [address], [place], [place] [address]          Radiology Studies: US  Abdomen Limited RUQ (LIVER/GB) Result Date: 01/15/2024 CLINICAL DATA:  Elevated LFTs. EXAM: ULTRASOUND ABDOMEN LIMITED RIGHT UPPER QUADRANT COMPARISON:  CT from earlier today FINDINGS: Gallbladder: The gallbladder wall thickness is upper limits of normal at 2.9 mm. No gallstones, gallbladder sludge or pericholecystic fluid. Negative sonographic Murphy's sign. Common bile duct: Diameter: Nondilated measuring approximately 2 mm. No intrahepatic bile duct  dilatation. Liver: Increased parenchymal echogenicity. Tiny hypoechoic structure within the dome of the lateral segment of left lobe measures 0.1 x 0.6 cm. Small echogenic structure within the right lobe of liver measures 0.9 x 0.8 cm. Portal vein is patent on color Doppler imaging with normal direction of blood flow towards the liver. Other: None. IMPRESSION: 1. No acute findings. 2. Increased hepatic parenchymal echogenicity compatible with fatty infiltration. 3. Small echogenic structure within the right lobe of liver measures 0.9 x 0.8 cm. This is nonspecific but may represent  a small hemangioma. 4. Tiny hypoechoic structure within the dome of the lateral segment of left lobe measures 0.1 x 0.6 cm. This is nonspecific but may represent a small cyst. Electronically Signed   By: Kimberley Penman M.D.   On: 01/15/2024 11:28   CT ABDOMEN PELVIS W CONTRAST Result Date: 01/15/2024 CLINICAL DATA:  Acute abdominal pain and weakness for 1 week. EXAM: CT ABDOMEN AND PELVIS WITH CONTRAST TECHNIQUE: Multidetector CT imaging of the abdomen and pelvis was performed using the standard protocol following bolus administration of intravenous contrast. RADIATION DOSE REDUCTION: This exam was performed according to the departmental dose-optimization program which includes automated exposure control, adjustment of the mA and/or kV according to patient size and/or use of iterative reconstruction technique. CONTRAST:  75mL OMNIPAQUE IOHEXOL 300 MG/ML  SOLN COMPARISON:  None Available. FINDINGS: Lower Chest: Pulmonary airspace opacity with air bronchograms is seen in the lingula, suspicious for pneumonia. Hepatobiliary: No suspicious hepatic masses identified. Gallbladder is unremarkable. No evidence of biliary ductal dilatation. Pancreas:  No mass or inflammatory changes. Spleen: Within normal limits in size and appearance. Adrenals/Urinary Tract: No suspicious masses identified. No evidence of ureteral calculi or hydronephrosis.  Stomach/Bowel: No evidence of obstruction, inflammatory process or abnormal fluid collections. Normal appendix visualized. Vascular/Lymphatic: No pathologically enlarged lymph nodes. No acute vascular findings. Reproductive:  No mass or other significant abnormality. Other:  None. Musculoskeletal:  No suspicious bone lesions identified. IMPRESSION: No acute findings within the abdomen or pelvis. Pulmonary airspace opacity in the lingula, suspicious for pneumonia. Electronically Signed   By: Marlyce Sine M.D.   On: 01/15/2024 10:59   DG Chest Port 1 View Result Date: 01/15/2024 CLINICAL DATA:  Questionable sepsis - evaluate for abnormality EXAM: PORTABLE CHEST - 1 VIEW COMPARISON:  06/08/2021. FINDINGS: Cardiac silhouette is unremarkable. No pneumothorax. No pleural effusion. Calcified aorta. The visualized skeletal structures are unremarkable. Alveolar opacity consistent with left mid lungpneumonia versus volume loss. Follow up recommended to confirm resolution as a precaution. IMPRESSION: Left-sided alveolar process may represent pneumonia. Follow-up after therapy recommended to confirm resolution. Electronically Signed   By: Sydell Eva M.D.   On: 01/15/2024 09:29        Scheduled Meds:  ibuprofen   400 mg Oral Once   Continuous Infusions:  sodium chloride  125 mL/hr at 01/16/24 0431   azithromycin 500 mg (01/16/24 1027)   cefTRIAXone (ROCEPHIN)  IV       LOS: 1 day    Time spent: 45 minutes spent on chart review, discussion with nursing staff, consultants, updating family and interview/physical exam; more than 50% of that time was spent in counseling and/or coordination of care.    Enrigue Harvard, DO [facility] Available via Epic secure chat 7am-7pm After these hours, please refer to coverage provider listed on [email or web address] 01/16/2024, 10:41 AM

## 2024-01-17 ENCOUNTER — Telehealth: Payer: Self-pay | Admitting: Nurse Practitioner

## 2024-01-17 DIAGNOSIS — B179 Acute viral hepatitis, unspecified: Secondary | ICD-10-CM | POA: Diagnosis not present

## 2024-01-17 DIAGNOSIS — R7401 Elevation of levels of liver transaminase levels: Secondary | ICD-10-CM | POA: Diagnosis not present

## 2024-01-17 DIAGNOSIS — D696 Thrombocytopenia, unspecified: Secondary | ICD-10-CM | POA: Diagnosis not present

## 2024-01-17 DIAGNOSIS — D649 Anemia, unspecified: Secondary | ICD-10-CM | POA: Diagnosis not present

## 2024-01-17 LAB — CBC
HCT: 33.9 % — ABNORMAL LOW (ref 39.0–52.0)
Hemoglobin: 11 g/dL — ABNORMAL LOW (ref 13.0–17.0)
MCH: 28.6 pg (ref 26.0–34.0)
MCHC: 32.4 g/dL (ref 30.0–36.0)
MCV: 88.3 fL (ref 80.0–100.0)
Platelets: 137 10*3/uL — ABNORMAL LOW (ref 150–400)
RBC: 3.84 MIL/uL — ABNORMAL LOW (ref 4.22–5.81)
RDW: 14.4 % (ref 11.5–15.5)
WBC: 4 10*3/uL (ref 4.0–10.5)
nRBC: 0 % (ref 0.0–0.2)

## 2024-01-17 LAB — GASTROINTESTINAL PANEL BY PCR, STOOL (REPLACES STOOL CULTURE)

## 2024-01-17 LAB — COMPREHENSIVE METABOLIC PANEL WITH GFR
ALT: 349 U/L — ABNORMAL HIGH (ref 0–44)
AST: 369 U/L — ABNORMAL HIGH (ref 15–41)
Albumin: 2.7 g/dL — ABNORMAL LOW (ref 3.5–5.0)
Alkaline Phosphatase: 47 U/L (ref 38–126)
Anion gap: 7 (ref 5–15)
BUN: 15 mg/dL (ref 8–23)
CO2: 25 mmol/L (ref 22–32)
Calcium: 8.6 mg/dL — ABNORMAL LOW (ref 8.9–10.3)
Chloride: 103 mmol/L (ref 98–111)
Creatinine, Ser: 1.09 mg/dL (ref 0.61–1.24)
GFR, Estimated: >60 mL/min (ref >60)
Glucose, Bld: 97 mg/dL (ref 70–99)
Potassium: 3.3 mmol/L — ABNORMAL LOW (ref 3.5–5.1)
Sodium: 135 mmol/L (ref 135–145)
Total Bilirubin: 0.6 mg/dL (ref 0.0–1.2)
Total Protein: 6.5 g/dL (ref 6.5–8.1)

## 2024-01-17 LAB — PROTIME-INR
INR: 1.5 — ABNORMAL HIGH (ref 0.8–1.2)
Prothrombin Time: 18.2 s — ABNORMAL HIGH (ref 11.4–15.2)

## 2024-01-17 LAB — AFP TUMOR MARKER: AFP, Serum, Tumor Marker: <1.8 ng/mL (ref 0.0–8.4)

## 2024-01-17 LAB — CK: Total CK: 608 U/L — ABNORMAL HIGH (ref 49–397)

## 2024-01-17 LAB — CMV IGM: CMV IgM: <30 [AU]/ml (ref 0.0–29.9)

## 2024-01-17 LAB — HCV RNA QUANT: HCV Quantitative: NOT DETECTED [IU]/mL (ref >50)

## 2024-01-17 LAB — LIPASE, BLOOD: Lipase: 358 U/L — ABNORMAL HIGH (ref 11–51)

## 2024-01-17 LAB — HEPATITIS B DNA, ULTRAQUANTITATIVE, PCR
HBV DNA SERPL PCR-ACNC: NOT DETECTED [IU]/mL
HBV DNA SERPL PCR-LOG IU: UNDETERMINED {Log_IU}/mL

## 2024-01-17 MED ORDER — POTASSIUM CHLORIDE CRYS ER 20 MEQ PO TBCR
40.0000 meq | EXTENDED_RELEASE_TABLET | Freq: Once | ORAL | Status: AC
  Administered 2024-01-17: 40 meq via ORAL
  Filled 2024-01-17: qty 2

## 2024-01-17 NOTE — TOC Progression Note (Signed)
 Transition of Care [facility]) - Progression Note    Patient Details  Name: Christopher Mcfarland MRN: [id number] Date of Birth: 16-Oct-1945  Transition of Care [facility] CM/SW Contact  Pinkie Manger, Thersia Flax, RN Phone Number: 01/17/2024, 1:11 PM  Clinical Narrative: Patient/spouse in agreement to [facility] rep Amy for HHPT. No further CM needs.      Expected Discharge Plan: Home w Home Health Services Barriers to Discharge: Continued Medical Work up  Expected Discharge Plan and Services   Discharge Planning Services: CM Consult Post Acute Care Choice: Home Health Living arrangements for the past 2 months: Single Family Home                           HH Arranged: PT HH Agency: [facility] Date [facility] Agency Contacted: 01/17/24 Time HH Agency Contacted: 1310 Representative spoke with at [facility] Agency: Amy   Social Determinants of Health (SDOH) Interventions SDOH Screenings   Food Insecurity: No Food Insecurity (01/15/2024)  Housing: Low Risk  (01/15/2024)  Transportation Needs: No Transportation Needs (01/15/2024)  Utilities: Not At Risk (01/15/2024)  Alcohol Screen: Low Risk  (08/02/2023)  Depression (PHQ2-9): Low Risk  (11/07/2023)  Financial Resource Strain: Low Risk  (08/02/2023)  Physical Activity: Sufficiently Active (08/02/2023)  Social Connections: Moderately Integrated (01/15/2024)  Stress: No Stress Concern Present (08/02/2023)  Tobacco Use: Low Risk  (01/15/2024)  Health Literacy: Adequate Health Literacy (08/02/2023)    Readmission Risk Interventions     No data to display

## 2024-01-17 NOTE — Progress Notes (Addendum)
 [facility] Progress Note  CC:  Acute transaminitis   Subjective: He endorses feeling quite well this morning.  No nausea or vomiting.  No abdominal pain.  He passed a solid brown stool this morning, no further diarrhea.  He is tolerating a regular diet.  No chest pain or shortness of breath.  Daughter at the bedside.  Objective:  Vital signs in last 24 hours: Temp:  [98.2 F (36.8 C)-99 F (37.2 C)] 99 F (37.2 C) (05/06 0526) Pulse Rate:  [88-90] 90 (05/06 0526) Resp:  [18-20] 18 (05/06 0526) BP: (114-116)/(67-69) 114/67 (05/06 0526) SpO2:  [97 %-100 %] 98 % (05/06 0526) Last BM Date : 01/16/24 General: 78 year old male in no acute distress. Heart: Regular rate and rhythm, no murmurs. Pulm: Breath sounds clear throughout. Abdomen: Soft, nondistended.  Nontender.  Positive bowel sounds to all 4 quadrants.  No hepatosplenomegaly.  No palpable mass.  No bruit. Extremities: No edema. Neurologic:  Alert and  oriented x 4. Grossly normal neurologically. Psych:  Alert and cooperative. Normal mood and affect.  Intake/Output from previous day: 05/05 0701 - 05/06 0700 In: 3376.3 [P.O.:960; I.V.:2065.4; IV Piggyback:350.9] Out: 200 [Urine:200] Intake/Output this shift: No intake/output data recorded.  Lab Results: Recent Labs    01/15/24 0858 01/16/24 0459 01/17/24 0531  WBC 7.3 5.2 4.0  HGB 13.6 11.6* 11.0*  HCT 39.3 35.6* 33.9*  PLT 145* 129* 137*   BMET Recent Labs    01/15/24 0858 01/16/24 0459 01/17/24 0531  NA 132* 134* 135  K 4.1 3.7 3.3*  CL 92* 104 103  CO2 20* 21* 25  GLUCOSE 117* 107* 97  BUN 21 15 15   CREATININE 1.82* 1.29* 1.09  CALCIUM  9.8 8.3* 8.6*   LFT Recent Labs    01/17/24 0531  PROT 6.5  ALBUMIN 2.7*  AST 369*  ALT 349*  ALKPHOS 47  BILITOT 0.6   PT/INR Recent Labs    01/16/24 0459 01/17/24 0531  LABPROT 19.7* 18.2*  INR 1.6* 1.5*   Hepatitis Panel Recent Labs    01/15/24 1115  HEPBSAG NON REACTIVE   HCVAB Reactive*  HEPAIGM NON REACTIVE  HEPBIGM NON REACTIVE    ECHOCARDIOGRAM COMPLETE Result Date: 01/16/2024    ECHOCARDIOGRAM REPORT   Patient Name:   Christopher Mcfarland Date of Exam: 01/16/2024 Medical Rec #:  [id number]         Height:       68.0 in Accession #:    [id number]        Weight:       205.0 lb Date of Birth:  Dec 12, 1945         BSA:          2.065 m Patient Age:    78 years          BP:           125/78 mmHg Patient Gender: M                 HR:           98 bpm. Exam Location:  Inpatient Procedure: 2D Echo, Cardiac Doppler, Color Doppler and Intracardiac            Opacification Agent (Both Spectral and Color Flow Doppler were            utilized during procedure). Indications:    Abnormal ECG, Dyspnea  History:        Patient has no prior history of Echocardiogram examinations.  Risk Factors:Hypertension.  Sonographer:    Willey Harrier Referring Phys: [id number] DAVID MANUEL ORTIZ  Sonographer Comments: Technically difficult study due to poor echo windows. IMPRESSIONS  1. Left ventricular ejection fraction, by estimation, is 55 to 60%. The left ventricle has normal function. The left ventricle has no regional wall motion abnormalities. Left ventricular diastolic parameters were normal.  2. Right ventricular systolic function is normal. The right ventricular size is normal.  3. The mitral valve is grossly normal. Trivial mitral valve regurgitation. No evidence of mitral stenosis.  4. The aortic valve is grossly normal. There is mild calcification of the aortic valve. Aortic valve regurgitation is not visualized. Aortic valve sclerosis is present, with no evidence of aortic valve stenosis.  5. The inferior vena cava is normal in size with greater than 50% respiratory variability, suggesting right atrial pressure of 3 mmHg. Comparison(s): No prior Echocardiogram. Conclusion(s)/Recommendation(s): Normal biventricular function without evidence of hemodynamically significant valvular  heart disease. FINDINGS  Left Ventricle: Left ventricular ejection fraction, by estimation, is 55 to 60%. The left ventricle has normal function. The left ventricle has no regional wall motion abnormalities. Definity contrast agent was given IV to delineate the left ventricular  endocardial borders. The left ventricular internal cavity size was normal in size. There is no left ventricular hypertrophy. Left ventricular diastolic parameters were normal. Right Ventricle: The right ventricular size is normal. Right vetricular wall thickness was not well visualized. Right ventricular systolic function is normal. Left Atrium: Left atrial size was normal in size. Right Atrium: Right atrial size was normal in size. Pericardium: There is no evidence of pericardial effusion. Mitral Valve: The mitral valve is grossly normal. Trivial mitral valve regurgitation. No evidence of mitral valve stenosis. MV peak gradient, 5.2 mmHg. The mean mitral valve gradient is 3.0 mmHg. Tricuspid Valve: The tricuspid valve is not well visualized. Tricuspid valve regurgitation is trivial. No evidence of tricuspid stenosis. Aortic Valve: The aortic valve is grossly normal. There is mild calcification of the aortic valve. Aortic valve regurgitation is not visualized. Aortic valve sclerosis is present, with no evidence of aortic valve stenosis. Aortic valve peak gradient measures 6.0 mmHg. Pulmonic Valve: The pulmonic valve was not well visualized. Pulmonic valve regurgitation is not visualized. No evidence of pulmonic stenosis. Aorta: The aortic root and ascending aorta are structurally normal, with no evidence of dilitation. Venous: The inferior vena cava is normal in size with greater than 50% respiratory variability, suggesting right atrial pressure of 3 mmHg. IAS/Shunts: The atrial septum is grossly normal.  LEFT VENTRICLE PLAX 2D LVIDd:         3.90 cm   Diastology LVIDs:         2.77 cm   LV e' medial:    8.38 cm/s LV PW:         0.90 cm   LV  E/e' medial:  9.9 LV IVS:        1.00 cm   LV e' lateral:   9.57 cm/s LVOT diam:     2.10 cm   LV E/e' lateral: 8.7 LV SV:         64 LV SV Index:   31 LVOT Area:     3.46 cm  RIGHT VENTRICLE            IVC RV Basal diam:  3.40 cm    IVC diam: 1.60 cm RV S prime:     9.57 cm/s TAPSE (M-mode): 1.9 cm LEFT ATRIUM  Index        RIGHT ATRIUM           Index LA Vol (A2C):   33.9 ml 16.41 ml/m  RA Area:     11.40 cm LA Vol (A4C):   18.0 ml 8.69 ml/m   RA Volume:   20.80 ml  10.07 ml/m LA Biplane Vol: 27.0 ml 13.07 ml/m  AORTIC VALVE AV Area (Vmax): 2.90 cm AV Vmax:        122.00 cm/s AV Peak Grad:   6.0 mmHg LVOT Vmax:      102.00 cm/s LVOT Vmean:     72.600 cm/s LVOT VTI:       0.184 m  AORTA Ao Root diam: 3.50 cm Ao Asc diam:  3.50 cm MITRAL VALVE MV Area (PHT): 3.81 cm     SHUNTS MV Area VTI:   3.09 cm     Systemic VTI:  0.18 m MV Peak grad:  5.2 mmHg     Systemic Diam: 2.10 cm MV Mean grad:  3.0 mmHg MV Vmax:       1.14 m/s MV Vmean:      75.1 cm/s MV Decel Time: 199 msec MV E velocity: 83.10 cm/s MV A velocity: 108.00 cm/s MV E/A ratio:  0.77 Sheryle Donning MD Electronically signed by Sheryle Donning MD Signature Date/Time: 01/16/2024/1:31:15 PM    Final    US  Abdomen Limited RUQ (LIVER/GB) Result Date: 01/15/2024 CLINICAL DATA:  Elevated LFTs. EXAM: ULTRASOUND ABDOMEN LIMITED RIGHT UPPER QUADRANT COMPARISON:  CT from earlier today FINDINGS: Gallbladder: The gallbladder wall thickness is upper limits of normal at 2.9 mm. No gallstones, gallbladder sludge or pericholecystic fluid. Negative sonographic Murphy's sign. Common bile duct: Diameter: Nondilated measuring approximately 2 mm. No intrahepatic bile duct dilatation. Liver: Increased parenchymal echogenicity. Tiny hypoechoic structure within the dome of the lateral segment of left lobe measures 0.1 x 0.6 cm. Small echogenic structure within the right lobe of liver measures 0.9 x 0.8 cm. Portal vein is patent on color Doppler  imaging with normal direction of blood flow towards the liver. Other: None. IMPRESSION: 1. No acute findings. 2. Increased hepatic parenchymal echogenicity compatible with fatty infiltration. 3. Small echogenic structure within the right lobe of liver measures 0.9 x 0.8 cm. This is nonspecific but may represent a small hemangioma. 4. Tiny hypoechoic structure within the dome of the lateral segment of left lobe measures 0.1 x 0.6 cm. This is nonspecific but may represent a small cyst. Electronically Signed   By: Kimberley Penman M.D.   On: 01/15/2024 11:28   CT ABDOMEN PELVIS W CONTRAST Result Date: 01/15/2024 CLINICAL DATA:  Acute abdominal pain and weakness for 1 week. EXAM: CT ABDOMEN AND PELVIS WITH CONTRAST TECHNIQUE: Multidetector CT imaging of the abdomen and pelvis was performed using the standard protocol following bolus administration of intravenous contrast. RADIATION DOSE REDUCTION: This exam was performed according to the departmental dose-optimization program which includes automated exposure control, adjustment of the mA and/or kV according to patient size and/or use of iterative reconstruction technique. CONTRAST:  75mL OMNIPAQUE IOHEXOL 300 MG/ML  SOLN COMPARISON:  None Available. FINDINGS: Lower Chest: Pulmonary airspace opacity with air bronchograms is seen in the lingula, suspicious for pneumonia. Hepatobiliary: No suspicious hepatic masses identified. Gallbladder is unremarkable. No evidence of biliary ductal dilatation. Pancreas:  No mass or inflammatory changes. Spleen: Within normal limits in size and appearance. Adrenals/Urinary Tract: No suspicious masses identified. No evidence of ureteral calculi or hydronephrosis. Stomach/Bowel: No evidence of  obstruction, inflammatory process or abnormal fluid collections. Normal appendix visualized. Vascular/Lymphatic: No pathologically enlarged lymph nodes. No acute vascular findings. Reproductive:  No mass or other significant abnormality. Other:   None. Musculoskeletal:  No suspicious bone lesions identified. IMPRESSION: No acute findings within the abdomen or pelvis. Pulmonary airspace opacity in the lingula, suspicious for pneumonia. Electronically Signed   By: Marlyce Sine M.D.   On: 01/15/2024 10:59   Patient Profile: Christopher Mcfarland is a 78 y.o. male with a past medical history of hypertension, hyperlipidemia, gout, lumbar radiculopathy, chronic hepatitis C GT1a treated with Harvoni  with SVR 2017 and colon polyps. Admitted 01/15/2024 with a cough, generalized weakness and fever.   Assessment / Plan:  78 year old male with a past history of chronic hepatitis C GT1a treated with Harvoni  x 12 weeks with SVR 2017 and past hepatitis B exposure (negative Hep B surface antigen, positive Hep B core total antibody and positive Hep B surface antibody 08/2015) admitted 01/15/2024 with generalized weakness, fever and acute transaminitis. Suspect viral/infectious hepatitis. AST 1,162 -> 535 -> 369. ALT 721 -> 434 -> 349. Normal T. Bil and Alk phos levels. Lipase 605 -> 640 -> 358. Hep A IgM nonreactive. Hep B core IgM nonreactive. Hep B surface antigen nonreactive. Hep B DNA pending. Hep C antibody positive (HCV antibody will always remain positive). Hep C RNA pending. CK 1,081 -> 864 -> 608. INR 1.7 -> 1.6 -> 1.5. EBV Igm and EBV IgM pending. HSV PCR pending. HIV negative. AFP < 1.8. RUQ sonogram showed a normal gallbladder without biliary ductal dilatation, hepatic steatosis, a 0.9 x 0.8 cm lesion in the right liver lobe, possible hemangioma and a 0.1 x 0.6 cm hypoechoic structure, possible small cyst. CTAP without contrast showed a normal liver and pancreas.  Hemodynamically stable. -Heart healthy diet -Avoid hepatotoxins -Continue to hold Rosuvastatin  -Await further recommendations per Dr.  Venice Gillis   Diarrhea, resolved. GI pathogen panel pending.  Patient passed a formed brown stool this morning. -Await GI pathogen panel results    AKI, secondary to  viral illness +/- rhabdomyolysis. Cr 1.81. - IV fluids per the hospitalist   Elevated CK, possible rhabdomyolysis  -Await am CK level -IV fluids per the hospitalist   LLL pneumonia, on Azithromycin and Ceftriaxone IV.  SARS coronavirus 2, RSV and influenza A/B-.  Pneumonia urinary antigen negative. - Management per the hospitalist   Normocytic anemia, dilutional component secondary to IV fluids. No overt GI bleeding. Hg 13.6 -> 11.6 -> 11.0.   Thrombocytopenia, secondary to acute hepatitis. PLT 145 -> 129 -> 137.  CTAP with contrast showed a normal liver and spleen.  Hypokalemia. K+ 3.3. - Management per the hospitalist     Principal Problem:   Acute hepatitis Active Problems:   Essential hypertension   Hyperlipidemia   Left lumbar radiculopathy   Coronary artery calcification   Hyponatremia   AKI (acute kidney injury) (HCC)   Metabolic acidosis, increased anion gap   Elevated CK   Hyperproteinemia   Thrombocytopenia (HCC)   Left lower lobe pulmonary infiltrate   Elevated lipase     LOS: 2 days   Christopher Mcfarland  01/17/2024, 10:29 AM    Attending physician's note   I have taken history, reviewed the chart and examined the patient. I performed a substantive portion of this encounter, including complete performance of at least one of the key components, in conjunction with the APP. I agree with the Advanced Practitioner's note, impression and recommendations.  LFTs trending down. Neg CT AP. Nl AFP. ?DILI vs viral hepatitis. Diarrhea- resolved. Neg GI pathogens H/O chronic hepatitis C (S/P Rx with Harvoni  with SVR). No liver cirrhosis. LLQ pneumonia on azithromycin/ceftriaxone. Elevated CK- ?Rhabdo  Plan: - Continue supportive treatment. - Check LFTs in 1 week. - FU in GI clinic in 3-4 weeks. - Follow viral studies. - Continue to hold Crestor  for now until LFTs have normalized, then can rechallenge - OK from GI standpoint to D/C home in AM - Hold off on  MRI for now. If LFTs are persistently high, then would reconsider. - Will sign off for now.    Magnus Schuller, MD [facility] [phone]

## 2024-01-17 NOTE — Progress Notes (Signed)
 Mobility Specialist - Progress Note   01/17/24 1259  Mobility  Activity Ambulated independently in hallway  Level of Assistance Independent  Assistive Device None  Distance Ambulated (ft) 250 ft  Activity Response Tolerated well  Mobility Referral Yes  Mobility visit 1 Mobility  Mobility Specialist Start Time (ACUTE ONLY) 1252  Mobility Specialist Stop Time (ACUTE ONLY) 1258  Mobility Specialist Time Calculation (min) (ACUTE ONLY) 6 min   Pt received in recliner and agreeable to mobility. No complaints during session. Pt to recliner after session with all needs met.    [facility]

## 2024-01-17 NOTE — Telephone Encounter (Signed)
 Hello POD [facility] nurses, patient will likely be discharge home from [facility] tomorrow. Pls contact patient to have a hepatic panel, CBC and CK level done in one week post discharge and schedule him for a follow up appointment with Dr. Dominic Friendly or POD [facility] APP in 3 to 4 weeks. DX: acute transaminitis. THX.

## 2024-01-17 NOTE — Progress Notes (Signed)
 PROGRESS NOTE    Christopher Mcfarland  ZDG:[id number] DOB: 14-Jun-1946 DOA: 01/15/2024 PCP: Roslyn Coombe, MD    Brief Narrative:  Christopher Mcfarland is a 78 y.o. male with medical history significant of essential hypertension, gout, treated hepatitis C, history of liver mass, hyperlipidemia, class I obesity, left lumbar radiculopathy, coronary artery calcification who presented to the emergency department with complaints of generalized weakness for about a week associated with daily fever for the last 4 days and abdominal cramping. Much improved.  Suspect home in next 24-48 hours if not further work up by GI.  Assessment and Plan: Elevated LFTs -Statin use and History of hep C. Intake alcohol x 2 last Monday. -GI consulted- may need liver MRI      Elevated lipase  Normal pancreas on imaging. -trend     Left lower lobe pulmonary infiltrate- PNA Continue ceftriaxone 1 g IVPB daily. Continue azithromycin 500 mg IVPB daily. Check strep pneumoniae urinary antigen. Check sputum Gram stain, culture and sensitivity.    AKI (acute kidney injury) (HCC) -resolved     Hyponatremia -trending up     Elevated CK In the setting of statin use. -trending down      Hyperproteinemia Secondary to hemoconcentration. -improved with IVF     Thrombocytopenia (HCC) -trend     Essential hypertension Continue amlodipine  5 mg p.o. daily.     Hyperlipidemia Hold rosuvastatin .   Hypokalemia -replete     Coronary artery calcification Holding aspirin  for now. Follow-up with PCP or cardiology as an outpatient.      DVT prophylaxis: SCDs Start: 01/15/24 1544    Code Status: Full Code   Disposition Plan:  Level of care: Progressive Status is: Inpatient     Consultants:  GI   Subjective: Feeling and eating well  Objective: Vitals:   01/16/24 0907 01/16/24 1329 01/16/24 2033 01/17/24 0526  BP: 131/77 116/69 114/68 114/67  Pulse: 95 88 90 90  Resp: 18  20 18   Temp: 100.3 F  (37.9 C) 98.9 F (37.2 C) 98.2 F (36.8 C) 99 F (37.2 C)  TempSrc: Oral Oral Oral Oral  SpO2: 94% 100% 97% 98%  Weight:      Height:        Intake/Output Summary (Last 24 hours) at 01/17/2024 1219 Last data filed at 01/17/2024 0300 Gross per 24 hour  Intake 2656.28 ml  Output 200 ml  Net 2456.28 ml   Filed Weights   01/15/24 0842  Weight: 93 kg    Examination:   General: Appearance:    Obese male in no acute distress     Lungs:     respirations unlabored  Heart:    Normal heart rate. .    MS:   All extremities are intact.    Neurologic:   Awake, alert- pleasant and cooperative       Data Reviewed: I have personally reviewed following labs and imaging studies  CBC: Recent Labs  Lab 01/15/24 0858 01/16/24 0459 01/17/24 0531  WBC 7.3 5.2 4.0  NEUTROABS 4.7  --   --   HGB 13.6 11.6* 11.0*  HCT 39.3 35.6* 33.9*  MCV 85.2 90.4 88.3  PLT 145* 129* 137*   Basic Metabolic Panel: Recent Labs  Lab 01/15/24 0858 01/16/24 0459 01/17/24 0531  NA 132* 134* 135  K 4.1 3.7 3.3*  CL 92* 104 103  CO2 20* 21* 25  GLUCOSE 117* 107* 97  BUN 21 15 15   CREATININE 1.82* 1.29* 1.09  CALCIUM   9.8 8.3* 8.6*  MG 2.0  --   --   PHOS 2.9  --   --    GFR: Estimated Creatinine Clearance: 63.8 mL/min (by C-G formula based on SCr of 1.09 mg/dL). Liver Function Tests: Recent Labs  Lab 01/15/24 0858 01/16/24 0459 01/17/24 0531  AST 1,162* 535* 369*  ALT 721* 434* 349*  ALKPHOS 73 49 47  BILITOT 1.0 0.9 0.6  PROT 8.6* 7.0 6.5  ALBUMIN 4.1 2.8* 2.7*   Recent Labs  Lab 01/15/24 1115 01/16/24 0459 01/17/24 0531  LIPASE 605* 640* 358*   Recent Labs  Lab 01/16/24 0459  AMMONIA 35   Coagulation Profile: Recent Labs  Lab 01/15/24 1115 01/16/24 0459 01/17/24 0531  INR 1.7* 1.6* 1.5*   Cardiac Enzymes: Recent Labs  Lab 01/15/24 0858 01/16/24 0459 01/17/24 0531  CKTOTAL 1,081* 864* 608*   BNP (last 3 results) No results for input(s): "PROBNP" in the last  8760 hours. HbA1C: No results for input(s): "HGBA1C" in the last 72 hours. CBG: No results for input(s): "GLUCAP" in the last 168 hours. Lipid Profile: No results for input(s): "CHOL", "HDL", "LDLCALC", "TRIG", "CHOLHDL", "LDLDIRECT" in the last 72 hours. Thyroid  Function Tests: No results for input(s): "TSH", "T4TOTAL", "FREET4", "T3FREE", "THYROIDAB" in the last 72 hours. Anemia Panel: No results for input(s): "VITAMINB12", "FOLATE", "FERRITIN", "TIBC", "IRON", "RETICCTPCT" in the last 72 hours. Sepsis Labs: Recent Labs  Lab 01/15/24 0858  LATICACIDVEN 1.2    Recent Results (from the past 240 hours)  Blood Culture (routine x 2)     Status: None (Preliminary result)   Collection Time: 01/15/24  8:00 AM   Specimen: BLOOD  Result Value Ref Range Status   Specimen Description   Final    BLOOD RIGHT ANTECUBITAL Performed at [facility], [address] [place], [place] [address]    Special Requests   Final    BOTTLES DRAWN AEROBIC AND ANAEROBIC Blood Culture results may not be optimal due to an inadequate volume of blood received in culture bottles Performed at [facility], [address] [place], [place] [address]    Culture   Final    NO GROWTH 2 DAYS Performed at [facility], [address] [place], [place] [address]    Report Status PENDING  Incomplete  Blood Culture (routine x 2)     Status: None (Preliminary result)   Collection Time: 01/15/24  8:04 AM   Specimen: BLOOD  Result Value Ref Range Status   Specimen Description   Final    BLOOD RIGHT ANTECUBITAL Performed at [facility], [address] [place], [place] [address]    Special Requests   Final    BOTTLES DRAWN AEROBIC ONLY Blood Culture results may not be optimal due to an inadequate volume of blood received in culture bottles Performed at [facility], [address] [place], [place] [address]    Culture   Final    NO GROWTH 2 DAYS Performed at  [facility], [address], [place], [place] [address]    Report Status PENDING  Incomplete  Resp panel by RT-PCR (RSV, Flu A&B, Covid) Anterior Nasal Swab     Status: None   Collection Time: 01/15/24  8:29 AM   Specimen: Anterior Nasal Swab  Result Value Ref Range Status   SARS Coronavirus 2 by RT PCR NEGATIVE NEGATIVE Final    Comment: (NOTE) SARS-CoV-2 target nucleic acids  are NOT DETECTED.  The SARS-CoV-2 RNA is generally detectable in upper respiratory specimens during the acute phase of infection. The lowest concentration of SARS-CoV-2 viral copies this assay can detect is 138 copies/mL. A negative result does not preclude SARS-Cov-2 infection and should not be used as the sole basis for treatment or other patient management decisions. A negative result may occur with  improper specimen collection/handling, submission of specimen other than nasopharyngeal swab, presence of viral mutation(s) within the areas targeted by this assay, and inadequate number of viral copies(<138 copies/mL). A negative result must be combined with clinical observations, patient history, and epidemiological information. The expected result is Negative.  Fact Sheet for Patients:  BloggerCourse.com  Fact Sheet for Healthcare Providers:  SeriousBroker.it  This test is no t yet approved or cleared by the [place]  FDA and  has been authorized for detection and/or diagnosis of SARS-CoV-2 by FDA under an Emergency Use Authorization (EUA). This EUA will remain  in effect (meaning this test can be used) for the duration of the COVID-19 declaration under Section 564(b)(1) of the Act, 21 U.S.C.section 360bbb-3(b)(1), unless the authorization is terminated  or revoked sooner.       Influenza A by PCR NEGATIVE NEGATIVE Final   Influenza B by PCR NEGATIVE NEGATIVE Final    Comment: (NOTE) The Xpert Xpress SARS-CoV-2/FLU/RSV plus assay is  intended as an aid in the diagnosis of influenza from Nasopharyngeal swab specimens and should not be used as a sole basis for treatment. Nasal washings and aspirates are unacceptable for Xpert Xpress SARS-CoV-2/FLU/RSV testing.  Fact Sheet for Patients: BloggerCourse.com  Fact Sheet for Healthcare Providers: SeriousBroker.it  This test is not yet approved or cleared by the [place]  FDA and has been authorized for detection and/or diagnosis of SARS-CoV-2 by FDA under an Emergency Use Authorization (EUA). This EUA will remain in effect (meaning this test can be used) for the duration of the COVID-19 declaration under Section 564(b)(1) of the Act, 21 U.S.C. section 360bbb-3(b)(1), unless the authorization is terminated or revoked.     Resp Syncytial Virus by PCR NEGATIVE NEGATIVE Final    Comment: (NOTE) Fact Sheet for Patients: BloggerCourse.com  Fact Sheet for Healthcare Providers: SeriousBroker.it  This test is not yet approved or cleared by the [place]  FDA and has been authorized for detection and/or diagnosis of SARS-CoV-2 by FDA under an Emergency Use Authorization (EUA). This EUA will remain in effect (meaning this test can be used) for the duration of the COVID-19 declaration under Section 564(b)(1) of the Act, 21 U.S.C. section 360bbb-3(b)(1), unless the authorization is terminated or revoked.  Performed at [facility], [address] [place], [place] [address]   Gastrointestinal Panel by PCR , Stool     Status: None   Collection Time: 01/16/24  1:06 AM   Specimen: Stool  Result Value Ref Range Status   Campylobacter species NOT DETECTED NOT DETECTED Final   Plesimonas shigelloides NOT DETECTED NOT DETECTED Final   Salmonella species NOT DETECTED NOT DETECTED Final   Yersinia enterocolitica NOT DETECTED NOT DETECTED Final   Vibrio species NOT  DETECTED NOT DETECTED Final   Vibrio cholerae NOT DETECTED NOT DETECTED Final   Enteroaggregative E coli (EAEC) NOT DETECTED NOT DETECTED Final   Enteropathogenic E coli (EPEC) NOT DETECTED NOT DETECTED Final   Enterotoxigenic E coli (ETEC) NOT DETECTED NOT DETECTED Final   Shiga like toxin producing E coli (STEC) NOT DETECTED NOT DETECTED Final   Shigella/Enteroinvasive E  coli (EIEC) NOT DETECTED NOT DETECTED Final   Cryptosporidium NOT DETECTED NOT DETECTED Final   Cyclospora cayetanensis NOT DETECTED NOT DETECTED Final   Entamoeba histolytica NOT DETECTED NOT DETECTED Final   Giardia lamblia NOT DETECTED NOT DETECTED Final   Adenovirus F40/41 NOT DETECTED NOT DETECTED Final   Astrovirus NOT DETECTED NOT DETECTED Final   Norovirus GI/GII NOT DETECTED NOT DETECTED Final   Rotavirus A NOT DETECTED NOT DETECTED Final   Sapovirus (I, II, IV, and V) NOT DETECTED NOT DETECTED Final    Comment: Performed at [facility], [address], [place], [place] [address]         Radiology Studies: ECHOCARDIOGRAM COMPLETE Result Date: 01/16/2024    ECHOCARDIOGRAM REPORT   Patient Name:   Christopher Mcfarland Date of Exam: 01/16/2024 Medical Rec #:  [id number]         Height:       68.0 in Accession #:    [id number]        Weight:       205.0 lb Date of Birth:  1946/03/08         BSA:          2.065 m Patient Age:    78 years          BP:           125/78 mmHg Patient Gender: M                 HR:           98 bpm. Exam Location:  Inpatient Procedure: 2D Echo, Cardiac Doppler, Color Doppler and Intracardiac            Opacification Agent (Both Spectral and Color Flow Doppler were            utilized during procedure). Indications:    Abnormal ECG, Dyspnea  History:        Patient has no prior history of Echocardiogram examinations.                 Risk Factors:Hypertension.  Sonographer:    Willey Harrier Referring Phys: [id number] DAVID MANUEL ORTIZ  Sonographer Comments: Technically difficult study due to  poor echo windows. IMPRESSIONS  1. Left ventricular ejection fraction, by estimation, is 55 to 60%. The left ventricle has normal function. The left ventricle has no regional wall motion abnormalities. Left ventricular diastolic parameters were normal.  2. Right ventricular systolic function is normal. The right ventricular size is normal.  3. The mitral valve is grossly normal. Trivial mitral valve regurgitation. No evidence of mitral stenosis.  4. The aortic valve is grossly normal. There is mild calcification of the aortic valve. Aortic valve regurgitation is not visualized. Aortic valve sclerosis is present, with no evidence of aortic valve stenosis.  5. The inferior vena cava is normal in size with greater than 50% respiratory variability, suggesting right atrial pressure of 3 mmHg. Comparison(s): No prior Echocardiogram. Conclusion(s)/Recommendation(s): Normal biventricular function without evidence of hemodynamically significant valvular heart disease. FINDINGS  Left Ventricle: Left ventricular ejection fraction, by estimation, is 55 to 60%. The left ventricle has normal function. The left ventricle has no regional wall motion abnormalities. Definity contrast agent was given IV to delineate the left ventricular  endocardial borders. The left ventricular internal cavity size was normal in size. There is no left ventricular hypertrophy. Left ventricular diastolic parameters were normal. Right Ventricle: The right ventricular size is normal. Right vetricular wall thickness was not  well visualized. Right ventricular systolic function is normal. Left Atrium: Left atrial size was normal in size. Right Atrium: Right atrial size was normal in size. Pericardium: There is no evidence of pericardial effusion. Mitral Valve: The mitral valve is grossly normal. Trivial mitral valve regurgitation. No evidence of mitral valve stenosis. MV peak gradient, 5.2 mmHg. The mean mitral valve gradient is 3.0 mmHg. Tricuspid Valve:  The tricuspid valve is not well visualized. Tricuspid valve regurgitation is trivial. No evidence of tricuspid stenosis. Aortic Valve: The aortic valve is grossly normal. There is mild calcification of the aortic valve. Aortic valve regurgitation is not visualized. Aortic valve sclerosis is present, with no evidence of aortic valve stenosis. Aortic valve peak gradient measures 6.0 mmHg. Pulmonic Valve: The pulmonic valve was not well visualized. Pulmonic valve regurgitation is not visualized. No evidence of pulmonic stenosis. Aorta: The aortic root and ascending aorta are structurally normal, with no evidence of dilitation. Venous: The inferior vena cava is normal in size with greater than 50% respiratory variability, suggesting right atrial pressure of 3 mmHg. IAS/Shunts: The atrial septum is grossly normal.  LEFT VENTRICLE PLAX 2D LVIDd:         3.90 cm   Diastology LVIDs:         2.77 cm   LV e' medial:    8.38 cm/s LV PW:         0.90 cm   LV E/e' medial:  9.9 LV IVS:        1.00 cm   LV e' lateral:   9.57 cm/s LVOT diam:     2.10 cm   LV E/e' lateral: 8.7 LV SV:         64 LV SV Index:   31 LVOT Area:     3.46 cm  RIGHT VENTRICLE            IVC RV Basal diam:  3.40 cm    IVC diam: 1.60 cm RV S prime:     9.57 cm/s TAPSE (M-mode): 1.9 cm LEFT ATRIUM             Index        RIGHT ATRIUM           Index LA Vol (A2C):   33.9 ml 16.41 ml/m  RA Area:     11.40 cm LA Vol (A4C):   18.0 ml 8.69 ml/m   RA Volume:   20.80 ml  10.07 ml/m LA Biplane Vol: 27.0 ml 13.07 ml/m  AORTIC VALVE AV Area (Vmax): 2.90 cm AV Vmax:        122.00 cm/s AV Peak Grad:   6.0 mmHg LVOT Vmax:      102.00 cm/s LVOT Vmean:     72.600 cm/s LVOT VTI:       0.184 m  AORTA Ao Root diam: 3.50 cm Ao Asc diam:  3.50 cm MITRAL VALVE MV Area (PHT): 3.81 cm     SHUNTS MV Area VTI:   3.09 cm     Systemic VTI:  0.18 m MV Peak grad:  5.2 mmHg     Systemic Diam: 2.10 cm MV Mean grad:  3.0 mmHg MV Vmax:       1.14 m/s MV Vmean:      75.1 cm/s MV  Decel Time: 199 msec MV E velocity: 83.10 cm/s MV A velocity: 108.00 cm/s MV E/A ratio:  0.77 Sheryle Donning MD Electronically signed by Sheryle Donning MD Signature Date/Time: 01/16/2024/1:31:15 PM    Final  Scheduled Meds:   Continuous Infusions:  azithromycin 500 mg (01/17/24 1117)   cefTRIAXone (ROCEPHIN)  IV 2 g (01/17/24 1119)     LOS: 2 days    Time spent: 45 minutes spent on chart review, discussion with nursing staff, consultants, updating family and interview/physical exam; more than 50% of that time was spent in counseling and/or coordination of care.    Enrigue Harvard, DO [facility] Available via Epic secure chat 7am-7pm After these hours, please refer to coverage provider listed on [email or web address] 01/17/2024, 12:19 PM

## 2024-01-18 DIAGNOSIS — N179 Acute kidney failure, unspecified: Secondary | ICD-10-CM

## 2024-01-18 DIAGNOSIS — A419 Sepsis, unspecified organism: Secondary | ICD-10-CM

## 2024-01-18 DIAGNOSIS — B179 Acute viral hepatitis, unspecified: Secondary | ICD-10-CM | POA: Diagnosis not present

## 2024-01-18 DIAGNOSIS — J189 Pneumonia, unspecified organism: Secondary | ICD-10-CM

## 2024-01-18 DIAGNOSIS — R748 Abnormal levels of other serum enzymes: Secondary | ICD-10-CM

## 2024-01-18 LAB — COMPREHENSIVE METABOLIC PANEL WITH GFR
ALT: 362 U/L — ABNORMAL HIGH (ref 0–44)
AST: 363 U/L — ABNORMAL HIGH (ref 15–41)
Albumin: 2.7 g/dL — ABNORMAL LOW (ref 3.5–5.0)
Alkaline Phosphatase: 57 U/L (ref 38–126)
Anion gap: 9 (ref 5–15)
BUN: 15 mg/dL (ref 8–23)
CO2: 22 mmol/L (ref 22–32)
Calcium: 8.8 mg/dL — ABNORMAL LOW (ref 8.9–10.3)
Chloride: 103 mmol/L (ref 98–111)
Creatinine, Ser: 0.98 mg/dL (ref 0.61–1.24)
GFR, Estimated: >60 mL/min
Glucose, Bld: 106 mg/dL — ABNORMAL HIGH (ref 70–99)
Potassium: 3.5 mmol/L (ref 3.5–5.1)
Sodium: 134 mmol/L — ABNORMAL LOW (ref 135–145)
Total Bilirubin: 0.6 mg/dL (ref 0.0–1.2)
Total Protein: 7 g/dL (ref 6.5–8.1)

## 2024-01-18 LAB — EPSTEIN-BARR VIRUS VCA, IGM: EBV VCA IgM: <36 U/mL (ref 0.0–35.9)

## 2024-01-18 LAB — CBC
HCT: 34.9 % — ABNORMAL LOW (ref 39.0–52.0)
Hemoglobin: 11.7 g/dL — ABNORMAL LOW (ref 13.0–17.0)
MCH: 29.3 pg (ref 26.0–34.0)
MCHC: 33.5 g/dL (ref 30.0–36.0)
MCV: 87.3 fL (ref 80.0–100.0)
Platelets: 192 K/uL (ref 150–400)
RBC: 4 MIL/uL — ABNORMAL LOW (ref 4.22–5.81)
RDW: 14.4 % (ref 11.5–15.5)
WBC: 3.5 K/uL — ABNORMAL LOW (ref 4.0–10.5)
nRBC: 0 % (ref 0.0–0.2)

## 2024-01-18 LAB — CK: Total CK: 377 U/L (ref 49–397)

## 2024-01-18 MED ORDER — AZITHROMYCIN 500 MG PO TABS
500.0000 mg | ORAL_TABLET | Freq: Every day | ORAL | Status: DC
  Administered 2024-01-18: 500 mg via ORAL
  Filled 2024-01-18: qty 1

## 2024-01-18 MED ORDER — CEFPODOXIME PROXETIL 200 MG PO TABS: 200.0000 mg | ORAL_TABLET | Freq: Two times a day (BID) | ORAL | 0 refills | Status: AC

## 2024-01-18 MED ORDER — AZITHROMYCIN 500 MG PO TABS: 500.0000 mg | ORAL_TABLET | Freq: Every day | ORAL | 0 refills | Status: AC

## 2024-01-18 NOTE — Telephone Encounter (Signed)
 Called the patient's cell phone. Does not ring, goes to a fast busy signal. Called the home phone. No answer, turns to a fax machine tone.

## 2024-01-18 NOTE — Progress Notes (Signed)
 Christopher Mcfarland  CC:  Acute transaminitis   Subjective: He feels well this morning, sitting up in the chair dressed ready to go home.  He denies having any chest pain or shortness of breath.  No nausea or vomiting.  No abdominal pain.  He passed a normal brown stool this morning.   Objective:  Vital signs in last 24 hours: Temp:  [98.2 F (36.8 C)-98.6 F (37 C)] 98.2 F (36.8 C) (05/07 0414) Pulse Rate:  [78-98] 78 (05/07 0414) Resp:  [14-19] 14 (05/07 0414) BP: (117-141)/(76-83) 141/83 (05/07 0414) SpO2:  [97 %-100 %] 98 % (05/07 0414) Last BM Date : 01/17/24 General: Alert 78 year old male in no acute distress. Heart: Regular rate and rhythm, no murmur. Pulm: Breath sounds clear throughout. Abdomen: Soft, nondistended and nontender. Nondistended.  Bowel sounds all 4 quadrants. Extremities: No edema. Neurologic:  Alert and  oriented x 4. Grossly normal neurologically. Psych:  Alert and cooperative. Normal mood and affect.  Intake/Output from previous day: 05/06 0701 - 05/07 0700 In: 360 [P.O.:360] Out: -  Intake/Output this shift: No intake/output data recorded.  Lab Results: Recent Labs    01/16/24 0459 01/17/24 0531 01/18/24 0527  WBC 5.2 4.0 3.5*  HGB 11.6* 11.0* 11.7*  HCT 35.6* 33.9* 34.9*  PLT 129* 137* 192   BMET Recent Labs    01/16/24 0459 01/17/24 0531 01/18/24 0527  NA 134* 135 134*  K 3.7 3.3* 3.5  CL 104 103 103  CO2 21* 25 22  GLUCOSE 107* 97 106*  BUN 15 15 15   CREATININE 1.29* 1.09 0.98  CALCIUM  8.3* 8.6* 8.8*   LFT Recent Labs    01/18/24 0527  PROT 7.0  ALBUMIN 2.7*  AST 363*  ALT 362*  ALKPHOS 57  BILITOT 0.6   PT/INR Recent Labs    01/16/24 0459 01/17/24 0531  LABPROT 19.7* 18.2*  INR 1.6* 1.5*   Hepatitis Panel Recent Labs    01/15/24 1115  HEPBSAG NON REACTIVE  HCVAB Reactive*  HEPAIGM NON REACTIVE  HEPBIGM NON REACTIVE    ECHOCARDIOGRAM COMPLETE Result Date: 01/16/2024     ECHOCARDIOGRAM REPORT   Patient Name:   Christopher Mcfarland Date of Exam: 01/16/2024 Medical Rec #:  [id number]         Height:       68.0 in Accession #:    [id number]        Weight:       205.0 lb Date of Birth:  Nov 12, 1945         BSA:          2.065 m Patient Age:    78 years          BP:           125/78 mmHg Patient Gender: M                 HR:           98 bpm. Exam Location:  Inpatient Procedure: 2D Echo, Cardiac Doppler, Color Doppler and Intracardiac            Opacification Agent (Both Spectral and Color Flow Doppler were            utilized during procedure). Indications:    Abnormal ECG, Dyspnea  History:        Patient has no prior history of Echocardiogram examinations.  Risk Factors:Hypertension.  Sonographer:    Willey Harrier Referring Phys: [id number] DAVID MANUEL ORTIZ  Sonographer Comments: Technically difficult study due to poor echo windows. IMPRESSIONS  1. Left ventricular ejection fraction, by estimation, is 55 to 60%. The left ventricle has normal function. The left ventricle has no regional wall motion abnormalities. Left ventricular diastolic parameters were normal.  2. Right ventricular systolic function is normal. The right ventricular size is normal.  3. The mitral valve is grossly normal. Trivial mitral valve regurgitation. No evidence of mitral stenosis.  4. The aortic valve is grossly normal. There is mild calcification of the aortic valve. Aortic valve regurgitation is not visualized. Aortic valve sclerosis is present, with no evidence of aortic valve stenosis.  5. The inferior vena cava is normal in size with greater than 50% respiratory variability, suggesting right atrial pressure of 3 mmHg. Comparison(s): No prior Echocardiogram. Conclusion(s)/Recommendation(s): Normal biventricular function without evidence of hemodynamically significant valvular heart disease. FINDINGS  Left Ventricle: Left ventricular ejection fraction, by estimation, is 55 to 60%. The left ventricle  has normal function. The left ventricle has no regional wall motion abnormalities. Definity contrast agent was given IV to delineate the left ventricular  endocardial borders. The left ventricular internal cavity size was normal in size. There is no left ventricular hypertrophy. Left ventricular diastolic parameters were normal. Right Ventricle: The right ventricular size is normal. Right vetricular wall thickness was not well visualized. Right ventricular systolic function is normal. Left Atrium: Left atrial size was normal in size. Right Atrium: Right atrial size was normal in size. Pericardium: There is no evidence of pericardial effusion. Mitral Valve: The mitral valve is grossly normal. Trivial mitral valve regurgitation. No evidence of mitral valve stenosis. MV peak gradient, 5.2 mmHg. The mean mitral valve gradient is 3.0 mmHg. Tricuspid Valve: The tricuspid valve is not well visualized. Tricuspid valve regurgitation is trivial. No evidence of tricuspid stenosis. Aortic Valve: The aortic valve is grossly normal. There is mild calcification of the aortic valve. Aortic valve regurgitation is not visualized. Aortic valve sclerosis is present, with no evidence of aortic valve stenosis. Aortic valve peak gradient measures 6.0 mmHg. Pulmonic Valve: The pulmonic valve was not well visualized. Pulmonic valve regurgitation is not visualized. No evidence of pulmonic stenosis. Aorta: The aortic root and ascending aorta are structurally normal, with no evidence of dilitation. Venous: The inferior vena cava is normal in size with greater than 50% respiratory variability, suggesting right atrial pressure of 3 mmHg. IAS/Shunts: The atrial septum is grossly normal.  LEFT VENTRICLE PLAX 2D LVIDd:         3.90 cm   Diastology LVIDs:         2.77 cm   LV e' medial:    8.38 cm/s LV PW:         0.90 cm   LV E/e' medial:  9.9 LV IVS:        1.00 cm   LV e' lateral:   9.57 cm/s LVOT diam:     2.10 cm   LV E/e' lateral: 8.7 LV SV:          64 LV SV Index:   31 LVOT Area:     3.46 cm  RIGHT VENTRICLE            IVC RV Basal diam:  3.40 cm    IVC diam: 1.60 cm RV S prime:     9.57 cm/s TAPSE (M-mode): 1.9 cm LEFT ATRIUM  Index        RIGHT ATRIUM           Index LA Vol (A2C):   33.9 ml 16.41 ml/m  RA Area:     11.40 cm LA Vol (A4C):   18.0 ml 8.69 ml/m   RA Volume:   20.80 ml  10.07 ml/m LA Biplane Vol: 27.0 ml 13.07 ml/m  AORTIC VALVE AV Area (Vmax): 2.90 cm AV Vmax:        122.00 cm/s AV Peak Grad:   6.0 mmHg LVOT Vmax:      102.00 cm/s LVOT Vmean:     72.600 cm/s LVOT VTI:       0.184 m  AORTA Ao Root diam: 3.50 cm Ao Asc diam:  3.50 cm MITRAL VALVE MV Area (PHT): 3.81 cm     SHUNTS MV Area VTI:   3.09 cm     Systemic VTI:  0.18 m MV Peak grad:  5.2 mmHg     Systemic Diam: 2.10 cm MV Mean grad:  3.0 mmHg MV Vmax:       1.14 m/s MV Vmean:      75.1 cm/s MV Decel Time: 199 msec MV E velocity: 83.10 cm/s MV A velocity: 108.00 cm/s MV E/A ratio:  0.77 Sheryle Donning MD Electronically signed by Sheryle Donning MD Signature Date/Time: 01/16/2024/1:31:15 PM    Final    Patient Profile: ARBER DOOLING is a 78 y.o. male with a past medical history of hypertension, hyperlipidemia, gout, lumbar radiculopathy, chronic hepatitis C GT1a treated with Harvoni  with SVR 2017 and colon polyps. Admitted 01/15/2024 with a cough, generalized weakness and fever.    Assessment / Plan:   78 year old male with a past history of chronic hepatitis C GT1a treated with Harvoni  x 12 weeks with SVR 2017 and past hepatitis B exposure (negative Hep B surface antigen, positive Hep B core total antibody and positive Hep B surface antibody 08/2015) admitted 01/15/2024 with generalized weakness, fever and acute transaminitis. Suspect viral/infectious hepatitis. AST 1,162 -> 535 -> 369 -> 363. ALT 721 -> 434 -> 349 ->362. Normal T. Bil and Alk phos levels. Lipase 605 -> 640 -> 358. Hep A IgM nonreactive. Hep B core IgM nonreactive. Hep B surface  antigen nonreactive. Hep B DNA undetectable. Hep C antibody positive. Hep C RNA undetectable. CK 1,081 -> 864 -> 608. INR 1.7 -> 1.6 -> 1.5. EBV Igm < 36. and CMV IgM  < 30. HSV PCR pending. HIV negative. AFP < 1.8. RUQ sonogram showed a normal gallbladder without biliary ductal dilatation, hepatic steatosis, a 0.9 x 0.8 cm lesion in the right liver lobe, possible hemangioma and a 0.1 x 0.6 cm hypoechoic structure, possible small cyst. CTAP without contrast showed a normal liver and pancreas.  Hemodynamically stable. -Heart healthy diet -Avoid hepatotoxins -Continue to hold Rosuvastatin  -Ok to discharge home from GI perspective -Our office will contact patient to check hepatic panel, CK and CBC in one week and follow up office appointment in 3 to 4 weeks   Diarrhea, resolved. GI pathogen panel negative. Patient passed a formed brown stool this morning.   AKI, secondary to viral illness +/- rhabdomyolysis, resolved    Elevated CK, possible rhabdomyolysis. CK levels downtrending, am CK level pending.    LLL pneumonia, on Azithromycin and Ceftriaxone IV.  SARS coronavirus 2, RSV and influenza A/B-.  Pneumonia urinary antigen negative. Asymptomatic at this time.    Normocytic anemia, dilutional component secondary to IV fluids. No  overt GI bleeding. Hg 13.6 -> 11.6 -> 11.0 -> 11.7. -Outpatient GI follow up as noted above    Thrombocytopenia, acute secondary to suspected viral illness, resolved. PLT 145 -> 129 -> 137 -> 192.  CTAP with contrast showed a normal liver and spleen.    Principal Problem:   Acute hepatitis Active Problems:   Essential hypertension   Hyperlipidemia   Left lumbar radiculopathy   Coronary artery calcification   Hyponatremia   AKI (acute kidney injury) (HCC)   Metabolic acidosis, increased anion gap   Elevated CK   Hyperproteinemia   Thrombocytopenia (HCC)   Left lower lobe pulmonary infiltrate   Elevated lipase     LOS: 3 days   Christopher Mcfarland   01/18/2024, 08:12 AM

## 2024-01-18 NOTE — Discharge Summary (Signed)
 Physician Discharge Summary   Patient: Christopher Mcfarland MRN: [id number] DOB: 12-06-1945  Admit date:     01/15/2024  Discharge date: 01/18/24  Discharge Physician: Bertram Brocks, MD    PCP: Roslyn Coombe, MD   Recommendations at discharge:   Check LFTs, CK, CBC in 1 week.  GI will follow-up in 3 to 4 weeks. Continue Zithromax for 2 more days, received dose this morning Continue Vantin 200 mg for 3 more days (starting today)  Discharge Diagnoses:  Acute transaminitis Community-acquired pneumonia, LLL pneumonia Acute kidney injury Diarrhea resolved Rhabdomyolysis   Essential hypertension   Hyperlipidemia   Left lumbar radiculopathy   Coronary artery calcification   Hyponatremia   Metabolic acidosis, increased anion gap   Hyperproteinemia   Thrombocytopenia (HCC)   Elevated lipase    Hospital Course:  Patient is a 78 y.o. male with medical history significant of essential hypertension, gout, treated hepatitis C, history of liver mass, hyperlipidemia, class I obesity, left lumbar radiculopathy, coronary artery calcification who presented to the emergency department with complaints of generalized weakness for about a week associated with daily fever for the last 4 days and abdominal cramping. Much improved.   Assessment and Plan:   Acute transaminitis: Suspected viral/infectious hepatitis -Patient has a history of chronic hepatitis C, treated in the past, statin use, also rhabdomyolysis - AST 1,162 -> 535 -> 369 -> 363 - ALT 721 -> 434 -> 349 ->362. Normal T. Bil and Alk phos levels - Lipase 605 -> 640 -> 358. Hep A IgM nonreactive. Hep B core IgM nonreactive. Hep B surface antigen nonreactive. Hep B DNA undetectable. Hep C antibody positive. Hep C RNA undetectable.  -CK 1,081 -> 864 -> 608-> 377 - INR 1.7 -> 1.6 -> 1.5.  - EBV Igm < 36. and CMV IgM  < 30. HIV negative. AFP < 1.8.  - RUQ US  showed normal gallbladder without biliary ductal dilatation, hepatic steatosis, a  0.9 x 0.8 cm lesion in the right liver lobe, possible hemangioma and a 0.1 x 0.6 cm hypoechoic structure, possible small cyst.  - CTAP without contrast showed a normal liver and pancreas.  -Continue to hold statin, avoid hepatotoxins, cleared by GI to discharge home.       Elevated lipase  Normal pancreas on imaging. - Trending down, tolerating diet without difficulty     Left lower lobe pulmonary infiltrate- PNA -Urine strep antigen negative  - Patient was placed on IV Rocephin, Zithromax  - Continue for 2 more days to complete full course      AKI (acute kidney injury) (HCC) - Presented with creatinine of 1.82, placed on IV fluid hydration  -resolved, creatinine 0.9 at discharge     Hyponatremia - Presented with sodium of 132, trending up -Tolerating diet, sodium improving 134 at discharge     Elevated CK In the setting of statin use. -trending down      Hyperproteinemia Secondary to hemoconcentration. -improved with IVF     Thrombocytopenia (HCC) -trend     Essential hypertension Continue amlodipine  5 mg p.o. daily.     Hyperlipidemia Hold rosuvastatin .   Hypokalemia Replaced     Coronary artery calcification Follow-up with PCP or cardiology as an outpatient.      Pain control - [place]  Controlled Substance Reporting System database was reviewed. and patient was instructed, not to drive, operate heavy machinery, perform activities at heights, swimming or participation in water activities or provide baby-sitting services while on Pain, Sleep and Anxiety  Medications; until their outpatient Physician has advised to do so again. Also recommended to not to take more than prescribed Pain, Sleep and Anxiety Medications.  Consultants: Gastroenterology Procedures performed: None Disposition: Home Diet recommendation:  Discharge Diet Orders (From admission, onward)     Start     Ordered   01/18/24 0000  Diet - low sodium heart healthy        01/18/24 0948             DISCHARGE MEDICATION: Allergies as of 01/18/2024       Reactions   Other Other (See Comments)   Anything that contains cane sugar makes the patient have to clear his throat repeatedly. Congestion, too??        Medication List     PAUSE taking these medications    ezetimibe  10 MG tablet Wait to take this until your doctor or other care provider tells you to start again. Commonly known as: Zetia  Take 1 tablet (10 mg total) by mouth daily.   rosuvastatin  40 MG tablet Wait to take this until your doctor or other care provider tells you to start again. Commonly known as: CRESTOR  Take 1 tablet (40 mg total) by mouth daily.       TAKE these medications    allopurinol  300 MG tablet Commonly known as: ZYLOPRIM  TAKE 1 TABLET(300 MG) BY MOUTH DAILY   amLODipine  5 MG tablet Commonly known as: NORVASC  TAKE 1 TABLET(5 MG) BY MOUTH DAILY   aspirin  EC 81 MG tablet Take 1 tablet (81 mg total) by mouth daily. Swallow whole.   azithromycin 500 MG tablet Commonly known as: ZITHROMAX Take 1 tablet (500 mg total) by mouth daily for 2 days. Start taking on: Jan 19, 2024   cefpodoxime 200 MG tablet Commonly known as: VANTIN Take 1 tablet (200 mg total) by mouth 2 (two) times daily for 3 days.   ipratropium 0.06 % nasal spray Commonly known as: ATROVENT  Place 2 sprays into both nostrils 3 (three) times daily as needed for rhinitis.   Vitamin D3 50 MCG (2000 UT) Tabs Take 2,000 Units by mouth daily.        Follow-up Information     [facility] Follow up.   Why: [facility] Contact information: [address] [place] [address] [phone]         Roslyn Coombe, MD. Schedule an appointment as soon as possible for a visit in 10 day(s).   Specialties: Internal Medicine, Radiology Why: for hospital follow-up, obtain labs hepatic panel, CK and CBC in one week Contact information: [address] [place]  [address] [phone]         Lajuan Pila, MD. Schedule an appointment as soon as possible for a visit in 3 week(s).   Specialties: Gastroenterology, Internal Medicine Why: for hospital follow-up Contact information: [address] [place] [address] [phone]                Discharge Exam: Filed Weights   01/15/24 0842  Weight: 93 kg   S: No acute complaints, sitting up in the chair and  dressed up to go home.  GI has cleared for discharge home with outpatient follow-up.  BP (!) 141/83 (BP Location: Left Arm)   Pulse 78   Temp 98.2 F (36.8 C)   Resp 14   Ht 5\' 8"  (1.727 m)   Wt 93 kg   SpO2 98%   BMI 31.17  kg/m   Physical Exam General: Alert and oriented x 3, NAD Cardiovascular: S1 S2 clear, RRR.  Respiratory: CTAB, no wheezing, rales or rhonchi Gastrointestinal: Soft, nontender, nondistended, NBS Ext: no pedal edema bilaterally Neuro: no new deficits Psych: Normal affect    Condition at discharge: fair  The results of significant diagnostics from this hospitalization (including imaging, microbiology, ancillary and laboratory) are listed below for reference.   Imaging Studies: ECHOCARDIOGRAM COMPLETE Result Date: 01/16/2024    ECHOCARDIOGRAM REPORT   Patient Name:   LETICIA OSTHOFF Date of Exam: 01/16/2024 Medical Rec #:  [id number]         Height:       68.0 in Accession #:    [id number]        Weight:       205.0 lb Date of Birth:  1946/09/10         BSA:          2.065 m Patient Age:    78 years          BP:           125/78 mmHg Patient Gender: M                 HR:           98 bpm. Exam Location:  Inpatient Procedure: 2D Echo, Cardiac Doppler, Color Doppler and Intracardiac            Opacification Agent (Both Spectral and Color Flow Doppler were            utilized during procedure). Indications:    Abnormal ECG, Dyspnea  History:        Patient has no prior history of Echocardiogram examinations.                 Risk Factors:Hypertension.   Sonographer:    Willey Harrier Referring Phys: [id number] DAVID MANUEL ORTIZ  Sonographer Comments: Technically difficult study due to poor echo windows. IMPRESSIONS  1. Left ventricular ejection fraction, by estimation, is 55 to 60%. The left ventricle has normal function. The left ventricle has no regional wall motion abnormalities. Left ventricular diastolic parameters were normal.  2. Right ventricular systolic function is normal. The right ventricular size is normal.  3. The mitral valve is grossly normal. Trivial mitral valve regurgitation. No evidence of mitral stenosis.  4. The aortic valve is grossly normal. There is mild calcification of the aortic valve. Aortic valve regurgitation is not visualized. Aortic valve sclerosis is present, with no evidence of aortic valve stenosis.  5. The inferior vena cava is normal in size with greater than 50% respiratory variability, suggesting right atrial pressure of 3 mmHg. Comparison(s): No prior Echocardiogram. Conclusion(s)/Recommendation(s): Normal biventricular function without evidence of hemodynamically significant valvular heart disease. FINDINGS  Left Ventricle: Left ventricular ejection fraction, by estimation, is 55 to 60%. The left ventricle has normal function. The left ventricle has no regional wall motion abnormalities. Definity contrast agent was given IV to delineate the left ventricular  endocardial borders. The left ventricular internal cavity size was normal in size. There is no left ventricular hypertrophy. Left ventricular diastolic parameters were normal. Right Ventricle: The right ventricular size is normal. Right vetricular wall thickness was not well visualized. Right ventricular systolic function is normal. Left Atrium: Left atrial size was normal in size. Right Atrium: Right atrial size was normal in size. Pericardium: There is no evidence of pericardial effusion. Mitral Valve: The mitral valve is grossly normal. Trivial  mitral valve  regurgitation. No evidence of mitral valve stenosis. MV peak gradient, 5.2 mmHg. The mean mitral valve gradient is 3.0 mmHg. Tricuspid Valve: The tricuspid valve is not well visualized. Tricuspid valve regurgitation is trivial. No evidence of tricuspid stenosis. Aortic Valve: The aortic valve is grossly normal. There is mild calcification of the aortic valve. Aortic valve regurgitation is not visualized. Aortic valve sclerosis is present, with no evidence of aortic valve stenosis. Aortic valve peak gradient measures 6.0 mmHg. Pulmonic Valve: The pulmonic valve was not well visualized. Pulmonic valve regurgitation is not visualized. No evidence of pulmonic stenosis. Aorta: The aortic root and ascending aorta are structurally normal, with no evidence of dilitation. Venous: The inferior vena cava is normal in size with greater than 50% respiratory variability, suggesting right atrial pressure of 3 mmHg. IAS/Shunts: The atrial septum is grossly normal.  LEFT VENTRICLE PLAX 2D LVIDd:         3.90 cm   Diastology LVIDs:         2.77 cm   LV e' medial:    8.38 cm/s LV PW:         0.90 cm   LV E/e' medial:  9.9 LV IVS:        1.00 cm   LV e' lateral:   9.57 cm/s LVOT diam:     2.10 cm   LV E/e' lateral: 8.7 LV SV:         64 LV SV Index:   31 LVOT Area:     3.46 cm  RIGHT VENTRICLE            IVC RV Basal diam:  3.40 cm    IVC diam: 1.60 cm RV S prime:     9.57 cm/s TAPSE (M-mode): 1.9 cm LEFT ATRIUM             Index        RIGHT ATRIUM           Index LA Vol (A2C):   33.9 ml 16.41 ml/m  RA Area:     11.40 cm LA Vol (A4C):   18.0 ml 8.69 ml/m   RA Volume:   20.80 ml  10.07 ml/m LA Biplane Vol: 27.0 ml 13.07 ml/m  AORTIC VALVE AV Area (Vmax): 2.90 cm AV Vmax:        122.00 cm/s AV Peak Grad:   6.0 mmHg LVOT Vmax:      102.00 cm/s LVOT Vmean:     72.600 cm/s LVOT VTI:       0.184 m  AORTA Ao Root diam: 3.50 cm Ao Asc diam:  3.50 cm MITRAL VALVE MV Area (PHT): 3.81 cm     SHUNTS MV Area VTI:   3.09 cm     Systemic  VTI:  0.18 m MV Peak grad:  5.2 mmHg     Systemic Diam: 2.10 cm MV Mean grad:  3.0 mmHg MV Vmax:       1.14 m/s MV Vmean:      75.1 cm/s MV Decel Time: 199 msec MV E velocity: 83.10 cm/s MV A velocity: 108.00 cm/s MV E/A ratio:  0.77 Sheryle Donning MD Electronically signed by Sheryle Donning MD Signature Date/Time: 01/16/2024/1:31:15 PM    Final    US  Abdomen Limited RUQ (LIVER/GB) Result Date: 01/15/2024 CLINICAL DATA:  Elevated LFTs. EXAM: ULTRASOUND ABDOMEN LIMITED RIGHT UPPER QUADRANT COMPARISON:  CT from earlier today FINDINGS: Gallbladder: The gallbladder wall thickness is upper limits of normal at 2.9 mm.  No gallstones, gallbladder sludge or pericholecystic fluid. Negative sonographic Murphy's sign. Common bile duct: Diameter: Nondilated measuring approximately 2 mm. No intrahepatic bile duct dilatation. Liver: Increased parenchymal echogenicity. Tiny hypoechoic structure within the dome of the lateral segment of left lobe measures 0.1 x 0.6 cm. Small echogenic structure within the right lobe of liver measures 0.9 x 0.8 cm. Portal vein is patent on color Doppler imaging with normal direction of blood flow towards the liver. Other: None. IMPRESSION: 1. No acute findings. 2. Increased hepatic parenchymal echogenicity compatible with fatty infiltration. 3. Small echogenic structure within the right lobe of liver measures 0.9 x 0.8 cm. This is nonspecific but may represent a small hemangioma. 4. Tiny hypoechoic structure within the dome of the lateral segment of left lobe measures 0.1 x 0.6 cm. This is nonspecific but may represent a small cyst. Electronically Signed   By: Kimberley Penman M.D.   On: 01/15/2024 11:28   CT ABDOMEN PELVIS W CONTRAST Result Date: 01/15/2024 CLINICAL DATA:  Acute abdominal pain and weakness for 1 week. EXAM: CT ABDOMEN AND PELVIS WITH CONTRAST TECHNIQUE: Multidetector CT imaging of the abdomen and pelvis was performed using the standard protocol following bolus  administration of intravenous contrast. RADIATION DOSE REDUCTION: This exam was performed according to the departmental dose-optimization program which includes automated exposure control, adjustment of the mA and/or kV according to patient size and/or use of iterative reconstruction technique. CONTRAST:  75mL OMNIPAQUE IOHEXOL 300 MG/ML  SOLN COMPARISON:  None Available. FINDINGS: Lower Chest: Pulmonary airspace opacity with air bronchograms is seen in the lingula, suspicious for pneumonia. Hepatobiliary: No suspicious hepatic masses identified. Gallbladder is unremarkable. No evidence of biliary ductal dilatation. Pancreas:  No mass or inflammatory changes. Spleen: Within normal limits in size and appearance. Adrenals/Urinary Tract: No suspicious masses identified. No evidence of ureteral calculi or hydronephrosis. Stomach/Bowel: No evidence of obstruction, inflammatory process or abnormal fluid collections. Normal appendix visualized. Vascular/Lymphatic: No pathologically enlarged lymph nodes. No acute vascular findings. Reproductive:  No mass or other significant abnormality. Other:  None. Musculoskeletal:  No suspicious bone lesions identified. IMPRESSION: No acute findings within the abdomen or pelvis. Pulmonary airspace opacity in the lingula, suspicious for pneumonia. Electronically Signed   By: Marlyce Sine M.D.   On: 01/15/2024 10:59   DG Chest Port 1 View Result Date: 01/15/2024 CLINICAL DATA:  Questionable sepsis - evaluate for abnormality EXAM: PORTABLE CHEST - 1 VIEW COMPARISON:  06/08/2021. FINDINGS: Cardiac silhouette is unremarkable. No pneumothorax. No pleural effusion. Calcified aorta. The visualized skeletal structures are unremarkable. Alveolar opacity consistent with left mid lungpneumonia versus volume loss. Follow up recommended to confirm resolution as a precaution. IMPRESSION: Left-sided alveolar process may represent pneumonia. Follow-up after therapy recommended to confirm resolution.  Electronically Signed   By: Sydell Eva M.D.   On: 01/15/2024 09:29    Microbiology: Results for orders placed or performed during the hospital encounter of 01/15/24  Blood Culture (routine x 2)     Status: None (Preliminary result)   Collection Time: 01/15/24  8:00 AM   Specimen: BLOOD  Result Value Ref Range Status   Specimen Description   Final    BLOOD RIGHT ANTECUBITAL Performed at [facility], [address] [place], [place] [address]    Special Requests   Final    BOTTLES DRAWN AEROBIC AND ANAEROBIC Blood Culture results may not be optimal due to an inadequate volume of blood received in culture bottles Performed at [facility],  [address], [place], [place] [address]    Culture   Final    NO GROWTH 3 DAYS Performed at [facility], [address], [place], [place] [address]    Report Status PENDING  Incomplete  Blood Culture (routine x 2)     Status: None (Preliminary result)   Collection Time: 01/15/24  8:04 AM   Specimen: BLOOD  Result Value Ref Range Status   Specimen Description   Final    BLOOD RIGHT ANTECUBITAL Performed at [facility], [address] [place], [place] [address]    Special Requests   Final    BOTTLES DRAWN AEROBIC ONLY Blood Culture results may not be optimal due to an inadequate volume of blood received in culture bottles Performed at [facility], [address] [place], [place] [address]    Culture   Final    NO GROWTH 3 DAYS Performed at [facility], [address] [place], [place] [address]    Report Status PENDING  Incomplete  Resp panel by RT-PCR (RSV, Flu A&B, Covid) Anterior Nasal Swab     Status: None   Collection Time: 01/15/24  8:29 AM   Specimen: Anterior Nasal Swab  Result Value Ref Range Status   SARS Coronavirus 2 by RT PCR NEGATIVE NEGATIVE Final    Comment: (NOTE) SARS-CoV-2 target nucleic acids are NOT DETECTED.  The SARS-CoV-2 RNA is generally  detectable in upper respiratory specimens during the acute phase of infection. The lowest concentration of SARS-CoV-2 viral copies this assay can detect is 138 copies/mL. A negative result does not preclude SARS-Cov-2 infection and should not be used as the sole basis for treatment or other patient management decisions. A negative result may occur with  improper specimen collection/handling, submission of specimen other than nasopharyngeal swab, presence of viral mutation(s) within the areas targeted by this assay, and inadequate number of viral copies(<138 copies/mL). A negative result must be combined with clinical observations, patient history, and epidemiological information. The expected result is Negative.  Fact Sheet for Patients:  BloggerCourse.com  Fact Sheet for Healthcare Providers:  SeriousBroker.it  This test is no t yet approved or cleared by the [place]  FDA and  has been authorized for detection and/or diagnosis of SARS-CoV-2 by FDA under an Emergency Use Authorization (EUA). This EUA will remain  in effect (meaning this test can be used) for the duration of the COVID-19 declaration under Section 564(b)(1) of the Act, 21 U.S.C.section 360bbb-3(b)(1), unless the authorization is terminated  or revoked sooner.       Influenza A by PCR NEGATIVE NEGATIVE Final   Influenza B by PCR NEGATIVE NEGATIVE Final    Comment: (NOTE) The Xpert Xpress SARS-CoV-2/FLU/RSV plus assay is intended as an aid in the diagnosis of influenza from Nasopharyngeal swab specimens and should not be used as a sole basis for treatment. Nasal washings and aspirates are unacceptable for Xpert Xpress SARS-CoV-2/FLU/RSV testing.  Fact Sheet for Patients: BloggerCourse.com  Fact Sheet for Healthcare Providers: SeriousBroker.it  This test is not yet approved or cleared by the [place]  FDA  and has been authorized for detection and/or diagnosis of SARS-CoV-2 by FDA under an Emergency Use Authorization (EUA). This EUA will remain in effect (meaning this test can be used) for the duration of the COVID-19 declaration under Section 564(b)(1) of the Act, 21 U.S.C. section 360bbb-3(b)(1), unless the authorization is terminated or revoked.     Resp Syncytial Virus  by PCR NEGATIVE NEGATIVE Final    Comment: (NOTE) Fact Sheet for Patients: BloggerCourse.com  Fact Sheet for Healthcare Providers: SeriousBroker.it  This test is not yet approved or cleared by the [place]  FDA and has been authorized for detection and/or diagnosis of SARS-CoV-2 by FDA under an Emergency Use Authorization (EUA). This EUA will remain in effect (meaning this test can be used) for the duration of the COVID-19 declaration under Section 564(b)(1) of the Act, 21 U.S.C. section 360bbb-3(b)(1), unless the authorization is terminated or revoked.  Performed at [facility], [address] [place], [place] [address]   Gastrointestinal Panel by PCR , Stool     Status: None   Collection Time: 01/16/24  1:06 AM   Specimen: Stool  Result Value Ref Range Status   Campylobacter species NOT DETECTED NOT DETECTED Final   Plesimonas shigelloides NOT DETECTED NOT DETECTED Final   Salmonella species NOT DETECTED NOT DETECTED Final   Yersinia enterocolitica NOT DETECTED NOT DETECTED Final   Vibrio species NOT DETECTED NOT DETECTED Final   Vibrio cholerae NOT DETECTED NOT DETECTED Final   Enteroaggregative E coli (EAEC) NOT DETECTED NOT DETECTED Final   Enteropathogenic E coli (EPEC) NOT DETECTED NOT DETECTED Final   Enterotoxigenic E coli (ETEC) NOT DETECTED NOT DETECTED Final   Shiga like toxin producing E coli (STEC) NOT DETECTED NOT DETECTED Final   Shigella/Enteroinvasive E coli (EIEC) NOT DETECTED NOT DETECTED Final   Cryptosporidium NOT  DETECTED NOT DETECTED Final   Cyclospora cayetanensis NOT DETECTED NOT DETECTED Final   Entamoeba histolytica NOT DETECTED NOT DETECTED Final   Giardia lamblia NOT DETECTED NOT DETECTED Final   Adenovirus F40/41 NOT DETECTED NOT DETECTED Final   Astrovirus NOT DETECTED NOT DETECTED Final   Norovirus GI/GII NOT DETECTED NOT DETECTED Final   Rotavirus A NOT DETECTED NOT DETECTED Final   Sapovirus (I, II, IV, and V) NOT DETECTED NOT DETECTED Final    Comment: Performed at [facility], [address] [place], [place] [address]    Labs: CBC: Recent Labs  Lab 01/15/24 0858 01/16/24 0459 01/17/24 0531 01/18/24 0527  WBC 7.3 5.2 4.0 3.5*  NEUTROABS 4.7  --   --   --   HGB 13.6 11.6* 11.0* 11.7*  HCT 39.3 35.6* 33.9* 34.9*  MCV 85.2 90.4 88.3 87.3  PLT 145* 129* 137* 192   Basic Metabolic Panel: Recent Labs  Lab 01/15/24 0858 01/16/24 0459 01/17/24 0531 01/18/24 0527  NA 132* 134* 135 134*  K 4.1 3.7 3.3* 3.5  CL 92* 104 103 103  CO2 20* 21* 25 22  GLUCOSE 117* 107* 97 106*  BUN 21 15 15 15   CREATININE 1.82* 1.29* 1.09 0.98  CALCIUM  9.8 8.3* 8.6* 8.8*  MG 2.0  --   --   --   PHOS 2.9  --   --   --    Liver Function Tests: Recent Labs  Lab 01/15/24 0858 01/16/24 0459 01/17/24 0531 01/18/24 0527  AST 1,162* 535* 369* 363*  ALT 721* 434* 349* 362*  ALKPHOS 73 49 47 57  BILITOT 1.0 0.9 0.6 0.6  PROT 8.6* 7.0 6.5 7.0  ALBUMIN 4.1 2.8* 2.7* 2.7*   CBG: No results for input(s): "GLUCAP" in the last 168 hours.  Discharge time spent: greater than 30 minutes.  Signed: Bertram Brocks, MD [facility] 01/18/2024

## 2024-01-18 NOTE — TOC Transition Note (Signed)
 Transition of Care [facility]) - Discharge Note   Patient Details  Name: Christopher Mcfarland MRN: [id number] Date of Birth: [birth year]  Transition of Care [facility]) CM/SW Contact:  Ruben Corolla, RN Phone Number: 01/18/2024, 10:15 AM   Clinical Narrative:  d/c home [facility] accepted. No further CM needs.     Final next level of care: Home w Home Health Services Barriers to Discharge: No Barriers Identified   Patient Goals and CMS Choice Patient states their goals for this hospitalization and ongoing recovery are:: Home CMS Medicare.gov Compare Post Acute Care list provided to:: Patient Choice offered to / list presented to : Patient [place] ownership interest in [facility].provided to:: Patient    Discharge Placement                       Discharge Plan and Services Additional resources added to the After Visit Summary for     Discharge Planning Services: CM Consult Post Acute Care Choice: Home Health                    HH Arranged: PT [facility] Agency: [facility] Date [facility] Agency Contacted: 01/18/24 Time HH Agency Contacted: 1014 Representative spoke with at [facility] Agency: Amy  Social Drivers of Health (SDOH) Interventions SDOH Screenings   Food Insecurity: No Food Insecurity (01/15/2024)  Housing: Low Risk  (01/15/2024)  Transportation Needs: No Transportation Needs (01/15/2024)  Utilities: Not At Risk (01/15/2024)  Alcohol Screen: Low Risk  (08/02/2023)  Depression (PHQ2-9): Low Risk  (11/07/2023)  Financial Resource Strain: Low Risk  (08/02/2023)  Physical Activity: Sufficiently Active (08/02/2023)  Social Connections: Moderately Integrated (01/15/2024)  Stress: No Stress Concern Present (08/02/2023)  Tobacco Use: Low Risk  (01/15/2024)  Health Literacy: Adequate Health Literacy (08/02/2023)     Readmission Risk Interventions     No data to display

## 2024-01-20 ENCOUNTER — Telehealth: Payer: Self-pay | Admitting: Internal Medicine

## 2024-01-20 LAB — CULTURE, BLOOD (ROUTINE X 2)
Culture: NO GROWTH
Culture: NO GROWTH

## 2024-01-20 NOTE — Telephone Encounter (Signed)
 Copied from CRM [id number]. Topic: Referral - Question >> Jan 20, 2024 11:36 AM Yolande Hench C wrote: Reason for CRM: Christopher Mcfarland with [facility] called to inform PCP they can't see the patient for an appointment until Monday(01/23/2024); Christopher Mcfarland has requested a call back from the PCP or nurse to confirm that is ok with the PCP; please follow up with Kristy([facility]) [phone]

## 2024-01-20 NOTE — Telephone Encounter (Signed)
 Sorry, I was out of office,.  Ok to see pt on Monday.  Thanks

## 2024-01-23 NOTE — Telephone Encounter (Signed)
 Called and gave verbals.

## 2024-01-24 ENCOUNTER — Other Ambulatory Visit: Payer: Self-pay

## 2024-01-24 DIAGNOSIS — R7401 Elevation of levels of liver transaminase levels: Secondary | ICD-10-CM

## 2024-01-24 NOTE — Telephone Encounter (Signed)
Line rings fast busy

## 2024-01-24 NOTE — Telephone Encounter (Signed)
 Line rings fast busy. Letter mailed.

## 2024-01-24 NOTE — Telephone Encounter (Signed)
 Labs have been entered  Appt has been made for 02/21/24 at 130 pm with Jessica Zehr PA

## 2024-02-03 ENCOUNTER — Encounter: Payer: Self-pay | Admitting: Internal Medicine

## 2024-02-03 ENCOUNTER — Ambulatory Visit (INDEPENDENT_AMBULATORY_CARE_PROVIDER_SITE_OTHER): Admitting: Internal Medicine

## 2024-02-03 ENCOUNTER — Ambulatory Visit: Payer: Self-pay | Admitting: Internal Medicine

## 2024-02-03 VITALS — BP 122/74 | HR 89 | Temp 98.2°F | Ht 68.0 in | Wt 202.0 lb

## 2024-02-03 DIAGNOSIS — E782 Mixed hyperlipidemia: Secondary | ICD-10-CM

## 2024-02-03 DIAGNOSIS — E538 Deficiency of other specified B group vitamins: Secondary | ICD-10-CM | POA: Diagnosis not present

## 2024-02-03 DIAGNOSIS — R739 Hyperglycemia, unspecified: Secondary | ICD-10-CM | POA: Diagnosis not present

## 2024-02-03 DIAGNOSIS — E559 Vitamin D deficiency, unspecified: Secondary | ICD-10-CM | POA: Diagnosis not present

## 2024-02-03 DIAGNOSIS — Z0001 Encounter for general adult medical examination with abnormal findings: Secondary | ICD-10-CM | POA: Diagnosis not present

## 2024-02-03 DIAGNOSIS — Z23 Encounter for immunization: Secondary | ICD-10-CM

## 2024-02-03 DIAGNOSIS — N320 Bladder-neck obstruction: Secondary | ICD-10-CM

## 2024-02-03 DIAGNOSIS — R7989 Other specified abnormal findings of blood chemistry: Secondary | ICD-10-CM

## 2024-02-03 DIAGNOSIS — R918 Other nonspecific abnormal finding of lung field: Secondary | ICD-10-CM

## 2024-02-03 LAB — HEPATIC FUNCTION PANEL
ALT: 33 U/L (ref 0–53)
AST: 22 U/L (ref 0–37)
Albumin: 4.3 g/dL (ref 3.5–5.2)
Alkaline Phosphatase: 71 U/L (ref 39–117)
Bilirubin, Direct: 0.1 mg/dL (ref 0.0–0.3)
Total Bilirubin: 0.6 mg/dL (ref 0.2–1.2)
Total Protein: 7.5 g/dL (ref 6.0–8.3)

## 2024-02-03 LAB — CBC WITH DIFFERENTIAL/PLATELET
Basophils Absolute: 0.1 10*3/uL (ref 0.0–0.1)
Basophils Relative: 2.3 % (ref 0.0–3.0)
Eosinophils Absolute: 0.1 10*3/uL (ref 0.0–0.7)
Eosinophils Relative: 3.4 % (ref 0.0–5.0)
HCT: 38.6 % — ABNORMAL LOW (ref 39.0–52.0)
Hemoglobin: 12.8 g/dL — ABNORMAL LOW (ref 13.0–17.0)
Lymphocytes Relative: 60.9 % — ABNORMAL HIGH (ref 12.0–46.0)
Lymphs Abs: 1.7 10*3/uL (ref 0.7–4.0)
MCHC: 33.1 g/dL (ref 30.0–36.0)
MCV: 87 fl (ref 78.0–100.0)
Monocytes Absolute: 0.4 10*3/uL (ref 0.1–1.0)
Monocytes Relative: 14 % — ABNORMAL HIGH (ref 3.0–12.0)
Neutro Abs: 0.5 10*3/uL — ABNORMAL LOW (ref 1.4–7.7)
Neutrophils Relative %: 19.4 % — ABNORMAL LOW (ref 43.0–77.0)
Platelets: 162 10*3/uL (ref 150.0–400.0)
RBC: 4.44 Mil/uL (ref 4.22–5.81)
RDW: 15.9 % — ABNORMAL HIGH (ref 11.5–15.5)
WBC: 2.7 10*3/uL — ABNORMAL LOW (ref 4.0–10.5)

## 2024-02-03 LAB — URINALYSIS, ROUTINE W REFLEX MICROSCOPIC
Bilirubin Urine: NEGATIVE
Hgb urine dipstick: NEGATIVE
Ketones, ur: NEGATIVE
Leukocytes,Ua: NEGATIVE
Nitrite: NEGATIVE
RBC / HPF: NONE SEEN (ref 0–?)
Specific Gravity, Urine: 1.015 (ref 1.000–1.030)
Total Protein, Urine: NEGATIVE
Urine Glucose: NEGATIVE
Urobilinogen, UA: 0.2 (ref 0.0–1.0)
pH: 6 (ref 5.0–8.0)

## 2024-02-03 LAB — BASIC METABOLIC PANEL WITH GFR
BUN: 14 mg/dL (ref 6–23)
CO2: 30 meq/L (ref 19–32)
Calcium: 9.6 mg/dL (ref 8.4–10.5)
Chloride: 102 meq/L (ref 96–112)
Creatinine, Ser: 1.02 mg/dL (ref 0.40–1.50)
GFR: 71.56 mL/min (ref >60.00)
Glucose, Bld: 101 mg/dL — ABNORMAL HIGH (ref 70–99)
Potassium: 4.4 meq/L (ref 3.5–5.1)
Sodium: 139 meq/L (ref 135–145)

## 2024-02-03 LAB — TSH: TSH: 1.27 u[IU]/mL (ref 0.35–5.50)

## 2024-02-03 LAB — LIPID PANEL
Cholesterol: 272 mg/dL — ABNORMAL HIGH (ref 0–200)
HDL: 44.4 mg/dL (ref >39.00)
LDL Cholesterol: 209 mg/dL — ABNORMAL HIGH (ref 0–99)
NonHDL: 227.33
Total CHOL/HDL Ratio: 6
Triglycerides: 94 mg/dL (ref 0.0–149.0)
VLDL: 18.8 mg/dL (ref 0.0–40.0)

## 2024-02-03 LAB — VITAMIN D 25 HYDROXY (VIT D DEFICIENCY, FRACTURES): VITD: 52.69 ng/mL (ref 30.00–100.00)

## 2024-02-03 LAB — VITAMIN B12: Vitamin B-12: 406 pg/mL (ref 211–911)

## 2024-02-03 LAB — MICROALBUMIN / CREATININE URINE RATIO
Creatinine,U: 73.4 mg/dL
Microalb Creat Ratio: UNDETERMINED mg/g (ref 0.0–30.0)
Microalb, Ur: <0.7 mg/dL

## 2024-02-03 LAB — HEMOGLOBIN A1C: Hgb A1c MFr Bld: 6.3 % (ref 4.6–6.5)

## 2024-02-03 LAB — PSA: PSA: 1.67 ng/mL (ref 0.10–4.00)

## 2024-02-03 MED ORDER — PROMETHAZINE-CODEINE 6.25-10 MG/5ML PO SYRP: 5.0000 mL | ORAL_SOLUTION | ORAL | 0 refills | Status: DC | PRN

## 2024-02-03 MED ORDER — PROMETHAZINE-CODEINE 6.25-10 MG/5ML PO SYRP: 5.0000 mL | ORAL_SOLUTION | ORAL | 0 refills | Status: AC | PRN

## 2024-02-03 NOTE — Progress Notes (Unsigned)
 Patient ID: Christopher Mcfarland, male   DOB: 05-Jan-1946, 78 y.o.   MRN: [id number]         Chief Complaint:: wellness exam and Medical Management of Chronic Issues [facility] follow up)  ***       HPI:  Christopher Mcfarland is a 78 y.o. male here for wellness exam                        Also s/p 4 days hospn d/c on may 7 with [facility].     Wt Readings from Last 3 Encounters:  02/03/24 202 lb (91.6 kg)  01/15/24 205 lb (93 kg)  11/07/23 216 lb (98 kg)   BP Readings from Last 3 Encounters:  02/03/24 122/74  01/18/24 (!) 141/83  11/07/23 120/72   Immunization History  Administered Date(s) Administered   Fluad Quad(high Dose 65+) 06/27/2019, 06/05/2021, 08/25/2022   Fluad Trivalent(High Dose 65+) 08/03/2023   Influenza Whole 08/15/2009   Influenza, High Dose Seasonal PF 07/11/2018   Influenza,inj,Quad PF,6+ Mos 08/27/2015   Moderna Sars-Covid-2 Vaccination 01/29/2021   PFIZER(Purple Top)SARS-COV-2 Vaccination 11/05/2019, 11/26/2019   Pneumococcal Conjugate-13 12/12/2017   Pneumococcal Polysaccharide-23 03/07/2019   Td 10/21/2009   Tdap 03/07/2020   Unspecified SARS-COV-2 Vaccination 10/15/2019, 11/08/2019   Zoster Recombinant(Shingrix) 03/06/2018, 06/05/2021   Zoster, Live 01/18/2016  There are no preventive care reminders to display for this patient.    Past Medical History:  Diagnosis Date   Essential hypertension    Gout 01/02/2020   Gout crystals on joint fluid April 2021   Hepatitis C 2003   Treated   Hyperlipidemia    Past Surgical History:  Procedure Laterality Date   CARPAL TUNNEL RELEASE     bilat 1999   HEMORRHOID SURGERY     1999/4 polyps removed rectum   TRIGGER FINGER RELEASE     right hand 1999    reports that he has never smoked. He has never used smokeless tobacco. He reports that he does not currently use alcohol after a past usage of about 2.0 - 3.0 standard drinks of alcohol per week. He reports that he does not use drugs. family history includes Bone  cancer in his father; CAD (age of onset: 29) in his brother; Kidney cancer in his mother. Allergies  Allergen Reactions   Other Other (See Comments)    Anything that contains cane sugar makes the patient have to clear his throat repeatedly. Congestion, too??   Current Outpatient Medications on File Prior to Visit  Medication Sig Dispense Refill   allopurinol  (ZYLOPRIM ) 300 MG tablet TAKE 1 TABLET(300 MG) BY MOUTH DAILY 90 tablet 3   amLODipine  (NORVASC ) 5 MG tablet TAKE 1 TABLET(5 MG) BY MOUTH DAILY 90 tablet 3   aspirin  EC 81 MG tablet Take 1 tablet (81 mg total) by mouth daily. Swallow whole. 30 tablet 12   Cholecalciferol  (VITAMIN D3) 50 MCG (2000 UT) TABS Take 2,000 Units by mouth daily.     ipratropium (ATROVENT ) 0.06 % nasal spray Place 2 sprays into both nostrils 3 (three) times daily as needed for rhinitis.     [Paused] ezetimibe  (ZETIA ) 10 MG tablet Take 1 tablet (10 mg total) by mouth daily. (Patient not taking: Reported on 02/03/2024) 90 tablet 3   [Paused] rosuvastatin  (CRESTOR ) 40 MG tablet Take 1 tablet (40 mg total) by mouth daily. (Patient not taking: Reported on 02/03/2024) 90 tablet 3   No current facility-administered medications on file prior to visit.  ROS:  All others reviewed and negative.  Objective        PE:  BP 122/74 (BP Location: Left Arm, Patient Position: Sitting, Cuff Size: Normal)   Pulse 89   Temp 98.2 F (36.8 C) (Oral)   Ht 5\' 8"  (1.727 m)   Wt 202 lb (91.6 kg)   SpO2 96%   BMI 30.71 kg/m                 Constitutional: Pt appears in NAD               HENT: Head: NCAT.                Right Ear: External ear normal.                 Left Ear: External ear normal.                Eyes: . Pupils are equal, round, and reactive to light. Conjunctivae and EOM are normal               Nose: without d/c or deformity               Neck: Neck supple. Gross normal ROM               Cardiovascular: Normal rate and regular rhythm.                  Pulmonary/Chest: Effort normal and breath sounds without rales or wheezing.                Abd:  Soft, NT, ND, + BS, no organomegaly               Neurological: Pt is alert. At baseline orientation, motor grossly intact               Skin: Skin is warm. No rashes, no other new lesions, LE edema - ***               Psychiatric: Pt behavior is normal without agitation   Micro: none  Cardiac tracings I have personally interpreted today:  none  Pertinent Radiological findings (summarize): none   Lab Results  Component Value Date   WBC 3.5 (L) 01/18/2024   HGB 11.7 (L) 01/18/2024   HCT 34.9 (L) 01/18/2024   PLT 192 01/18/2024   GLUCOSE 106 (H) 01/18/2024   CHOL 119 05/06/2023   TRIG 59.0 05/06/2023   HDL 37.10 (L) 05/06/2023   LDLDIRECT 78.0 10/19/2021   LDLCALC 70 05/06/2023   ALT 362 (H) 01/18/2024   AST 363 (H) 01/18/2024   NA 134 (L) 01/18/2024   K 3.5 01/18/2024   CL 103 01/18/2024   CREATININE 0.98 01/18/2024   BUN 15 01/18/2024   CO2 22 01/18/2024   TSH 1.43 11/24/2022   PSA 0.85 11/24/2022   INR 1.5 (H) 01/17/2024   HGBA1C 6.1 05/06/2023   Assessment/Plan:  Christopher Mcfarland is a 78 y.o. Black or African American [2] male with  has a past medical history of Essential hypertension, Gout (01/02/2020), Hepatitis C (2003), and Hyperlipidemia.  No problem-specific Assessment & Plan notes found for this encounter.  Followup: No follow-ups on file.  Rosalia Colonel, MD 02/03/2024 9:01 AM [facility]

## 2024-02-03 NOTE — Patient Instructions (Signed)
 Ok to continue to hold the zetia  and crestor  for now until you see the Gastroenterology July 2 Dr Chip Coulter had the Prevnar 20 pneumonia shot today  Please continue all other medications as before, and refills have been done if requested - the cough medicine  Please have the pharmacy call with any other refills you may need.  Please continue your efforts at being more active, low cholesterol diet, and weight control.  You are otherwise up to date with prevention measures today.  Please keep your appointments with your specialists as you may have planned - GI on July 2  Please go to the LAB at the blood drawing area for the tests to be done  You will be contacted by phone if any changes need to be made immediately.  Otherwise, you will receive a letter about your results with an explanation, but please check with MyChart first.  Please make an Appointment to return in 6 months, or sooner if needed, also with Lab Appointment for testing done 3-5 days before at the FIRST FLOOR Lab (so this is for TWO appointments - please see the scheduling desk as you leave)

## 2024-02-05 ENCOUNTER — Encounter: Payer: Self-pay | Admitting: Internal Medicine

## 2024-02-05 DIAGNOSIS — R7989 Other specified abnormal findings of blood chemistry: Secondary | ICD-10-CM | POA: Insufficient documentation

## 2024-02-05 NOTE — Addendum Note (Signed)
 Addended by: Roslyn Coombe on: 02/05/2024 05:03 PM   Modules accepted: Orders

## 2024-02-05 NOTE — Assessment & Plan Note (Signed)
 Lab Results  Component Value Date   LDLCALC 209 (H) 02/03/2024   Severe uncontrolled,, pt to continue to hold crestor  and zetia  for now pending further abnormal LFTs eval. , for lower chol diet

## 2024-02-05 NOTE — Assessment & Plan Note (Signed)
 Last vitamin D  Lab Results  Component Value Date   VD25OH 52.69 02/03/2024   Stable, cont oral replacement

## 2024-02-05 NOTE — Assessment & Plan Note (Signed)
 Clincally resolved,  to f/u any worsening symptoms or concerns

## 2024-02-05 NOTE — Assessment & Plan Note (Signed)
 Lab Results  Component Value Date   HGBA1C 6.3 02/03/2024   Stable, pt to continue current medical treatment  - diet,w t control

## 2024-02-05 NOTE — Assessment & Plan Note (Signed)
 Mod elevated enzymes persistent, to cont hold zetia  crestor  and f/u GI July 2 as planned

## 2024-02-05 NOTE — Assessment & Plan Note (Addendum)
Age and sex appropriate education and counseling updated with regular exercise and diet Referrals for preventative services - none needed Immunizations addressed - for prevnar 20 today Smoking counseling  - none needed Evidence for depression or other mood disorder - none significant Most recent labs reviewed. I have personally reviewed and have noted: 1) the patient's medical and social history 2) The patient's current medications and supplements 3) The patient's height, weight, and BMI have been recorded in the chart

## 2024-02-21 ENCOUNTER — Encounter: Payer: Self-pay | Admitting: Gastroenterology

## 2024-02-21 ENCOUNTER — Ambulatory Visit: Payer: Self-pay | Admitting: Gastroenterology

## 2024-02-21 ENCOUNTER — Other Ambulatory Visit (INDEPENDENT_AMBULATORY_CARE_PROVIDER_SITE_OTHER)

## 2024-02-21 ENCOUNTER — Ambulatory Visit: Admitting: Gastroenterology

## 2024-02-21 VITALS — BP 112/58 | HR 68 | Ht 68.0 in | Wt 201.8 lb

## 2024-02-21 DIAGNOSIS — R748 Abnormal levels of other serum enzymes: Secondary | ICD-10-CM

## 2024-02-21 DIAGNOSIS — R7989 Other specified abnormal findings of blood chemistry: Secondary | ICD-10-CM

## 2024-02-21 DIAGNOSIS — Z8619 Personal history of other infectious and parasitic diseases: Secondary | ICD-10-CM | POA: Diagnosis not present

## 2024-02-21 LAB — LIPASE: Lipase: 26 U/L (ref 11.0–59.0)

## 2024-02-21 NOTE — Progress Notes (Signed)
 02/21/2024 Christopher Mcfarland [id number] 02/15/1946   HISTORY OF PRESENT ILLNESS: This is a 78 year old male who is a patient of Dr. Revonda Castles.  He was seen by our inpatient service in early May for transaminitis.  He was hospitalized for pneumonia and significantly elevated CK, possible rhabdomyolysis with acute kidney injury.  LFTs reached a peak AST of 1162 and ALT 721 with normal total bili and alk phos.  Lipase also elevated at 605.  Lipase decreased to 358 during hospital stay.  LFTs improved as well.  Hepatitis A, B, C studies as well as EBV HSV, HIV all negative.  He did not have any abdominal pain.  They stopped his statin.  He does have history of hepatitis C, but was treated with Harvoni .  Viral counts were negative.  He does not have any complaints today.  Feels well.  Ultrasound RUQ:  IMPRESSION: 1. No acute findings. 2. Increased hepatic parenchymal echogenicity compatible with fatty infiltration. 3. Small echogenic structure within the right lobe of liver measures 0.9 x 0.8 cm. This is nonspecific but may represent a small hemangioma. 4. Tiny hypoechoic structure within the dome of the lateral segment of left lobe measures 0.1 x 0.6 cm. This is nonspecific but may represent a small cyst.  CT scan abdomen and pelvis with contrast:  IMPRESSION: No acute findings within the abdomen or pelvis.   Pulmonary airspace opacity in the lingula, suspicious for pneumonia.   Past Medical History:  Diagnosis Date   Essential hypertension    Gout 01/02/2020   Gout crystals on joint fluid April 2021   Hepatitis C 2003   Treated   Hyperlipidemia    Past Surgical History:  Procedure Laterality Date   CARPAL TUNNEL RELEASE     bilat 1999   HEMORRHOID SURGERY     1999/4 polyps removed rectum   TRIGGER FINGER RELEASE     right hand 1999    reports that he has never smoked. He has never used smokeless tobacco. He reports that he does not currently use alcohol after a past  usage of about 2.0 - 3.0 standard drinks of alcohol per week. He reports that he does not use drugs. family history includes Bone cancer in his father; CAD (age of onset: 36) in his brother; Kidney cancer in his mother. Allergies  Allergen Reactions   Other Other (See Comments)    Anything that contains cane sugar makes the patient have to clear his throat repeatedly. Congestion, too??      Outpatient Encounter Medications as of 02/21/2024  Medication Sig   allopurinol  (ZYLOPRIM ) 300 MG tablet TAKE 1 TABLET(300 MG) BY MOUTH DAILY   amLODipine  (NORVASC ) 5 MG tablet TAKE 1 TABLET(5 MG) BY MOUTH DAILY   aspirin  EC 81 MG tablet Take 1 tablet (81 mg total) by mouth daily. Swallow whole.   Cholecalciferol  (VITAMIN D3) 50 MCG (2000 UT) TABS Take 2,000 Units by mouth daily.   [Paused] ezetimibe  (ZETIA ) 10 MG tablet Take 1 tablet (10 mg total) by mouth daily.   ipratropium (ATROVENT ) 0.06 % nasal spray Place 2 sprays into both nostrils 3 (three) times daily as needed for rhinitis.   [Paused] rosuvastatin  (CRESTOR ) 40 MG tablet Take 1 tablet (40 mg total) by mouth daily.   No facility-administered encounter medications on file as of 02/21/2024.     REVIEW OF SYSTEMS  : All other systems reviewed and negative except where noted in the History of Present Illness.   PHYSICAL EXAM: BP Aaron Aas)  112/58   Pulse 68   Ht 5\' 8"  (1.727 m)   Wt 201 lb 12.8 oz (91.5 kg)   BMI 30.68 kg/m  General: Well developed male in no acute distress Head: Normocephalic and atraumatic Eyes:  Sclerae anicteric, conjunctiva pink. Ears: Normal auditory acuity Lungs: Clear throughout to auscultation; no W/R/R. Heart: Regular rate and rhythm; no M/R/G. Musculoskeletal: Symmetrical with no gross deformities  Skin: No lesions on visible extremities Extremities: No edema  Neurological: Alert oriented x 4, grossly non-focal Psychological:  Alert and cooperative. Normal mood and affect  ASSESSMENT AND PLAN: *Elevated LFTs:  LFTs were completely normal when checked 9 months ago.  Were found to be significantly elevated in May during hospitalization for diagnosis of pneumonia and rhabdomyolysis.  AST was 1100, ALT 721.  They rapidly improved.  Suspect it was due to the rhabdo and acute infectious process/reactive.  They have completely normalized as of labs on 5/23.  Liver imaging showed hepatic steatosis, small hemangioma and a small liver cyst, no concerning findings.  I do not think that any further evaluation is needed at this time. *Elevated lipase:  Had an elevated lipase at that time as well but pancreas normal on CT scan and no abdominal pain.  Suspect also related to acute illness.  Will recheck lipase today.  If still elevated then will consider MRI. *H/O chronic hepatitis C (S/P Rx with Harvoni  with SVR)  - Okay to resume his statin to see how he does with that, monitor LFTs.   CC:  Roslyn Coombe, MD

## 2024-02-21 NOTE — Patient Instructions (Signed)
 Your provider has requested that you go to the basement level for lab work before leaving today. Press "B" on the elevator. The lab is located at the first door on the left as you exit the elevator.  _______________________________________________________  If your blood pressure at your visit was 140/90 or greater, please contact your primary care physician to follow up on this.  _______________________________________________________  If you are age 76 or older, your body mass index should be between 23-30. Your Body mass index is 30.68 kg/m. If this is out of the aforementioned range listed, please consider follow up with your Primary Care Provider.  If you are age 70 or younger, your body mass index should be between 19-25. Your Body mass index is 30.68 kg/m. If this is out of the aformentioned range listed, please consider follow up with your Primary Care Provider.   ________________________________________________________  The [facility] providers would like to encourage you to use MYCHART to communicate with providers for non-urgent requests or questions.  Due to long hold times on the telephone, sending your provider a message by [facility] may be a faster and more efficient way to get a response.  Please allow 48 business hours for a response.  Please remember that this is for non-urgent requests.  _______________________________________________________

## 2024-02-22 NOTE — Progress Notes (Signed)
 ____________________________________________________________  Attending physician addendum:  Thank you for sending this case to me. I have reviewed the entire note and agree with the plan.  Incidentally, radiology reports increased hepatic echogenicity that may be fatty liver.  Could potentially be residual from previous HCV infection.  If fatty liver, his LFTs have returned to normal.  Fib 4 equals 1.8, though largely on the basis of a low normal platelet count that is not apparently due to portal hypertension and liver disease.  Recommendations for low carbohydrate diet should still be conveyed to him.  Lorella Roles, MD  ____________________________________________________________

## 2024-03-14 ENCOUNTER — Ambulatory Visit: Admitting: Nurse Practitioner

## 2024-05-07 ENCOUNTER — Ambulatory Visit: Payer: Medicare Other | Admitting: Internal Medicine

## 2024-06-04 ENCOUNTER — Ambulatory Visit: Attending: Emergency Medicine | Admitting: Emergency Medicine

## 2024-06-04 ENCOUNTER — Encounter: Payer: Self-pay | Admitting: Emergency Medicine

## 2024-06-04 VITALS — BP 126/82 | HR 90 | Ht 68.0 in | Wt 206.0 lb

## 2024-06-04 DIAGNOSIS — I2510 Atherosclerotic heart disease of native coronary artery without angina pectoris: Secondary | ICD-10-CM | POA: Diagnosis not present

## 2024-06-04 DIAGNOSIS — I10 Essential (primary) hypertension: Secondary | ICD-10-CM

## 2024-06-04 DIAGNOSIS — E782 Mixed hyperlipidemia: Secondary | ICD-10-CM

## 2024-06-04 MED ORDER — ROSUVASTATIN CALCIUM 40 MG PO TABS: 40.0000 mg | ORAL_TABLET | Freq: Every day | ORAL | 3 refills | Status: AC

## 2024-06-04 MED ORDER — EZETIMIBE 10 MG PO TABS: 10.0000 mg | ORAL_TABLET | Freq: Every day | ORAL | 3 refills | Status: AC

## 2024-06-04 NOTE — Patient Instructions (Signed)
 Medication Instructions:  PLEASE RESTART ZETIA  10 MG DAILY. PLEASE RESTART ROSUVASTATIN  40 MG DAILY.  Lab Work: FASTING LIPID PANEL AND CMET TO BE DONE TODAY. FASTING LIPID PANEL AND LFTs TO BE DONE IN 6-8 WEEKS.   Testing/Procedures: NONE  Follow-Up: At [facility], you and your health needs are our priority.  As part of our continuing mission to provide you with exceptional heart care, our providers are all part of one team.  This team includes your primary Cardiologist (physician) and Advanced Practice Providers or APPs (Physician Assistants and Nurse Practitioners) who all work together to provide you with the care you need, when you need it.  Your next appointment:   1 YEAR  Provider:   [name] FOUNTAIN, NP

## 2024-06-04 NOTE — Progress Notes (Signed)
 Cardiology Office Note:    Date:  06/04/2024  ID:  Christopher Mcfarland, DOB 1946-03-29, MRN [id number] PCP: Norleen Lynwood ORN, MD  [facility] Providers Cardiologist:  Lynwood Schilling, MD Cardiology APP:  Rana Lum LITTIE, NP       Patient Profile:       Chief Complaint: 1 year follow-up History of Present Illness:  Christopher Mcfarland is a 78 y.o. male with visit-pertinent history of elevated coronary calcium  score, hypertension, chronic hepatitis C, and hyperlipidemia  History of hyperlipidemia for several years.  He was on rosuvastatin  10 mg daily for many years.  CT calcium  score 03/19/2022 of 5942 (99th percentile).  Rosuvastatin  was increased to 40 mg daily and ezetimibe  10 mg daily was added.  He established with cardiology service by Dr. Alveta on 08/27/2022.  He was without any concerning cardiac symptoms.  He was last seen in clinic on 06/03/2023.  No symptoms of chest pain.  He has a follow-up in 1 year.  He was admitted 01/2024 with acute transaminitis suspected viral/infectious hepatitis.  He was ultimately diagnosed with pneumonia and rhabdomyolysis.  He had elevation in AST's, ALT's, lipase, and CK.  RUQ US  showed normal gallbladder without biliary ductal dilation, hepatic steatosis.  CTAP without contrast showed a normal liver and pancreas.  He was found to have left lower lobe infiltrate and diagnosed with pneumonia.  He was started on antibiotics.  On discharge his liver enzymes as well as CK were trending down.  His rosuvastatin  and Zetia  were held at discharge.  He was to follow-up with GI outpatient until he is able to restart statin lowering therapy.  Echocardiogram 01/16/2024 with LVEF 55 to 60%, no RWMA, normal diastolic parameters, RV function and size normal, no significant valvular abnormalities.  He followed up with GI outpatient on 6/10.  His LFTs were completely normal and rapidly improved.  Suspect it was due to rhabdomyolysis and acute infectious process/reactive.   Labs have completely normalized as of 5/23.  Lipase was repeated and was normal.  He was given the okay to resume statin therapy per GI.   Discussed the use of AI scribe software for clinical note transcription with the patient, who gave verbal consent to proceed.  History of Present Illness Christopher Mcfarland is a 78 year old male with coronary artery disease who presents for follow-up regarding coronary artery disease.  Today patient is doing well without acute cardiac concerns.  He denies any chest pains or anginal symptoms.    His LDL cholesterol level is 209 mg/dL, up from 70 mg/dL a year ago, correlating with the discontinuation of his cholesterol medications.  He has yet to restart his cholesterol-lowering therapy after his GI visit as he was waiting to hear from his PCP but does not have a follow-up visit until November of this year.  He remains without jaundice, abdominal pains, body aches.  He exercises regularly without chest pain or shortness of breath, maintaining an active lifestyle where he goes to the gym at least 3 times a week, push mows his yard as well as 4 other yards, and does nearly 10,000 steps daily on an exercise machine at home.  While he is exercising he denies any chest pains, or dyspnea.  Furthermore he denies orthopnea, PND, lightheadedness, dizziness, syncope, presyncope, palpitations.      Review of systems:  Please see the history of present illness. All other systems are reviewed and otherwise negative.      Studies Reviewed:  CT cardiac scoring 03/19/2022 FINDINGS: Coronary Calcium  Score:   Left main: 0   Left anterior descending artery: 2860   Left circumflex artery: 25   Right coronary artery: 3057   Total: 5942   Percentile: 99th   Pericardium: Normal.   Non-cardiac: See separate report from [facility].   IMPRESSION: Coronary calcium  score of 5942. This was 99th percentile for age-, race-, and sex-matched  controls.  Echocardiogram 01/16/2024 1. Left ventricular ejection fraction, by estimation, is 55 to 60%. The  left ventricle has normal function. The left ventricle has no regional  wall motion abnormalities. Left ventricular diastolic parameters were  normal.   2. Right ventricular systolic function is normal. The right ventricular  size is normal.   3. The mitral valve is grossly normal. Trivial mitral valve  regurgitation. No evidence of mitral stenosis.   4. The aortic valve is grossly normal. There is mild calcification of the  aortic valve. Aortic valve regurgitation is not visualized. Aortic valve  sclerosis is present, with no evidence of aortic valve stenosis.   5. The inferior vena cava is normal in size with greater than 50%  respiratory variability, suggesting right atrial pressure of 3 mmHg.   Risk Assessment/Calculations:              Physical Exam:   VS:  BP 126/82 (BP Location: Right Arm, Patient Position: Sitting, Cuff Size: Normal)   Pulse 90   Ht 5' 8 (1.727 m)   Wt 206 lb (93.4 kg)   BMI 31.32 kg/m    Wt Readings from Last 3 Encounters:  06/04/24 206 lb (93.4 kg)  02/21/24 201 lb 12.8 oz (91.5 kg)  02/03/24 202 lb (91.6 kg)    GEN: Well nourished, well developed in no acute distress NECK: No JVD; No carotid bruits CARDIAC: RRR, no murmurs, rubs, gallops RESPIRATORY:  Clear to auscultation without rales, wheezing or rhonchi  ABDOMEN: Soft, non-tender, non-distended EXTREMITIES:  No edema; No acute deformity      Assessment and Plan:  Coronary artery disease CT cardiac scoring 03/2022 with coronary calcium  score of 5942 (99th percentile) - Today patient is stable without anginal symptoms.  He maintains a very active lifestyle going to the gym 3 days a week, waling 10,000 steps every morning on a home exercise equipment, and regularly push mowing his and four others' yards without any exertional chest pains or dyspnea.  There is no indication for further  ischemic evaluation at this time - EKGs reviewed from admission in 01/2024 that were without ischemic changes - Continue aspirin  81 mg daily and amlodipine  5 mg daily - Restart rosuvastatin  40 mg daily and ezetimibe  10 mg daily (further described below)  Hyperlipidemia, LDL goal <70 LDL 209 on 01/2024 (previously 70 on 04/2023) As further noted in HPI statin therapy was held during admission on 01/2024 due to elevated LFTs in the setting of pneumonia and rhabdomyolysis.  Seen by GI outpatient on 6/10, LFTs were normal and rapidly improved.  Suspect it was due to rhabdomyolysis and acute infectious process/reactive.  No further workup was needed at that time.  He was given the okay to resume statin therapy by GI - Today he is without abdominal pains, jaundice, muscle pains.  Good UOP - He has yet to restart his statin lowering therapy at this time.  Tells me he has been waiting for his PCP follow-up on 07/2024 - Given his LDL of 209, history of CAD, and recent clearance to restart statin therapy  by GI, I will restart his rosuvastatin  and ezetimibe  today as well as repeat LFTs today - Restart rosuvastatin  40 mg daily and ezetimibe  10 mg daily - LFTs today - Repeat fasting lipid panel and LFTs x 8 weeks - Does follow-up with PCP on 07/2024  Hypertension Blood pressure today well-controlled at 126/82 - Continue amlodipine  5 mg daily      Dispo:  Return in about 1 year (around 06/04/2025).  Signed, Lum LITTIE Louis, NP

## 2024-06-05 ENCOUNTER — Ambulatory Visit: Payer: Self-pay | Admitting: Emergency Medicine

## 2024-06-05 LAB — COMPREHENSIVE METABOLIC PANEL WITH GFR
ALT: 24 IU/L (ref 0–44)
AST: 24 IU/L (ref 0–40)
Albumin: 4.5 g/dL (ref 3.8–4.8)
Alkaline Phosphatase: 75 IU/L (ref 47–123)
BUN/Creatinine Ratio: 16 (ref 10–24)
BUN: 21 mg/dL (ref 8–27)
Bilirubin Total: 0.6 mg/dL (ref 0.0–1.2)
CO2: 23 mmol/L (ref 20–29)
Calcium: 10 mg/dL (ref 8.6–10.2)
Chloride: 103 mmol/L (ref 96–106)
Creatinine, Ser: 1.32 mg/dL — AB (ref 0.76–1.27)
Globulin, Total: 3.2 g/dL (ref 1.5–4.5)
Glucose: 95 mg/dL (ref 70–99)
Potassium: 4.4 mmol/L (ref 3.5–5.2)
Sodium: 141 mmol/L (ref 134–144)
Total Protein: 7.7 g/dL (ref 6.0–8.5)
eGFR: 56 mL/min/1.73 — AB (ref >59)

## 2024-06-05 LAB — LIPID PANEL
Chol/HDL Ratio: 7.2 ratio — AB (ref 0.0–5.0)
Cholesterol, Total: 295 mg/dL — AB (ref 100–199)
HDL: 41 mg/dL (ref >39)
LDL Chol Calc (NIH): 227 mg/dL — AB (ref 0–99)
Triglycerides: 143 mg/dL (ref 0–149)
VLDL Cholesterol Cal: 27 mg/dL (ref 5–40)

## 2024-07-27 ENCOUNTER — Ambulatory Visit: Admitting: Internal Medicine

## 2024-07-27 ENCOUNTER — Encounter: Payer: Self-pay | Admitting: Internal Medicine

## 2024-07-27 VITALS — BP 132/80 | HR 97 | Temp 98.3°F | Ht 68.0 in | Wt 208.0 lb

## 2024-07-27 DIAGNOSIS — M542 Cervicalgia: Secondary | ICD-10-CM | POA: Diagnosis not present

## 2024-07-27 MED ORDER — PREDNISONE 20 MG PO TABS: 40.0000 mg | ORAL_TABLET | Freq: Every day | ORAL | 0 refills | Status: AC

## 2024-07-27 MED ORDER — CYCLOBENZAPRINE HCL 5 MG PO TABS: 5.0000 mg | ORAL_TABLET | Freq: Three times a day (TID) | ORAL | 1 refills | Status: AC | PRN

## 2024-07-27 NOTE — Patient Instructions (Signed)
 We have sent in prednisone  to take 2 pills daily for 5 days to help.  We have sent in a muscle relaxer flexeril  to take up to 3 times a day for pain. This can make you drowsy so do not drive until you know how this affects you.

## 2024-07-27 NOTE — Progress Notes (Signed)
 Subjective:   Patient ID: Christopher Mcfarland, male    DOB: 22-Dec-1945, 78 y.o.   MRN: [id number]  Discussed the use of AI scribe software for clinical note transcription with the patient, who gave verbal consent to proceed.  History of Present Illness Christopher Mcfarland is a 78 year old male who presents with neck pain.  He has been experiencing neck pain primarily on the left side, which began with light stiffness upon waking on Wednesday morning. The pain has progressively worsened, becoming severe by Thursday evening, making it difficult for him to recline or find a comfortable position in bed. The pain is localized to the neck without radiation to the arms and causes difficulty turning his head to the left while driving.  He recalls a similar episode of neck pain approximately four years ago, for which he was prescribed medication, though he cannot recall the specific medication. He has not identified any recent activities or incidents that could have triggered the current episode, such as lifting or unusual physical exertion.  He maintains an active lifestyle, attending the gym two to four times a week, but denies any recent incidents at the gym that could have contributed to his neck pain. Despite the pain, he was able to drive his granddaughter to school and then proceeded to the appointment.  No numbness or weakness in his arms or hands. He mentions using a cane for support while standing, although he typically does not require it for walking.  Review of Systems  Constitutional: Negative.   HENT: Negative.    Eyes: Negative.   Respiratory:  Negative for cough, chest tightness and shortness of breath.   Cardiovascular:  Negative for chest pain, palpitations and leg swelling.  Gastrointestinal:  Negative for abdominal distention, abdominal pain, constipation, diarrhea, nausea and vomiting.  Musculoskeletal:  Positive for myalgias, neck pain and neck stiffness.  Skin: Negative.    Neurological: Negative.  Negative for weakness and numbness.  Psychiatric/Behavioral: Negative.      Objective:  Physical Exam Constitutional:      Appearance: He is well-developed.  HENT:     Head: Normocephalic and atraumatic.  Cardiovascular:     Rate and Rhythm: Normal rate and regular rhythm.  Pulmonary:     Effort: Pulmonary effort is normal. No respiratory distress.     Breath sounds: Normal breath sounds. No wheezing or rales.  Abdominal:     General: Bowel sounds are normal. There is no distension.     Palpations: Abdomen is soft.     Tenderness: There is no abdominal tenderness.  Musculoskeletal:        General: Tenderness present.     Cervical back: Normal range of motion.     Comments: Tenderness band pattern across upper neck and scapular region left more than right  Skin:    General: Skin is warm and dry.  Neurological:     Mental Status: He is alert and oriented to person, place, and time.     Coordination: Coordination normal.     Vitals:   07/27/24 0915  BP: 132/80  Pulse: 97  Temp: 98.3 F (36.8 C)  TempSrc: Oral  SpO2: 95%  Weight: 208 lb (94.3 kg)  Height: 5' 8 (1.727 m)    Assessment and Plan Assessment & Plan Acute neck and upper back pain likely muscle strain  He experiences acute left-sided neck and upper back pain without radiation, numbness, or weakness. There is no recent trauma, though he had a similar  episode four years ago. No red flag signs. Prescribe prednisone , two pills once daily for five days. Prescribe cyclobenzaprine  up to three times daily as needed for pain, with caution about drowsiness.

## 2024-08-02 DIAGNOSIS — H524 Presbyopia: Secondary | ICD-10-CM | POA: Diagnosis not present

## 2024-08-02 DIAGNOSIS — H2513 Age-related nuclear cataract, bilateral: Secondary | ICD-10-CM | POA: Diagnosis not present

## 2024-08-08 ENCOUNTER — Ambulatory Visit (INDEPENDENT_AMBULATORY_CARE_PROVIDER_SITE_OTHER)

## 2024-08-08 ENCOUNTER — Other Ambulatory Visit (INDEPENDENT_AMBULATORY_CARE_PROVIDER_SITE_OTHER)

## 2024-08-08 ENCOUNTER — Ambulatory Visit: Payer: Self-pay | Admitting: Internal Medicine

## 2024-08-08 VITALS — BP 132/70 | HR 96 | Ht 68.0 in | Wt 206.6 lb

## 2024-08-08 DIAGNOSIS — Z1159 Encounter for screening for other viral diseases: Secondary | ICD-10-CM

## 2024-08-08 DIAGNOSIS — E782 Mixed hyperlipidemia: Secondary | ICD-10-CM

## 2024-08-08 DIAGNOSIS — Z0000 Encounter for general adult medical examination without abnormal findings: Secondary | ICD-10-CM

## 2024-08-08 DIAGNOSIS — R739 Hyperglycemia, unspecified: Secondary | ICD-10-CM

## 2024-08-08 DIAGNOSIS — Z23 Encounter for immunization: Secondary | ICD-10-CM | POA: Diagnosis not present

## 2024-08-08 DIAGNOSIS — E559 Vitamin D deficiency, unspecified: Secondary | ICD-10-CM | POA: Diagnosis not present

## 2024-08-08 LAB — LIPID PANEL
Cholesterol: 119 mg/dL (ref 0–200)
HDL: 43.5 mg/dL (ref >39.00)
LDL Cholesterol: 60 mg/dL (ref 0–99)
NonHDL: 75.14
Total CHOL/HDL Ratio: 3
Triglycerides: 75 mg/dL (ref 0.0–149.0)
VLDL: 15 mg/dL (ref 0.0–40.0)

## 2024-08-08 LAB — BASIC METABOLIC PANEL WITH GFR
BUN: 27 mg/dL — ABNORMAL HIGH (ref 6–23)
CO2: 28 meq/L (ref 19–32)
Calcium: 10.2 mg/dL (ref 8.4–10.5)
Chloride: 102 meq/L (ref 96–112)
Creatinine, Ser: 1.3 mg/dL (ref 0.40–1.50)
GFR: 53.29 mL/min — ABNORMAL LOW (ref >60.00)
Glucose, Bld: 108 mg/dL — ABNORMAL HIGH (ref 70–99)
Potassium: 4.2 meq/L (ref 3.5–5.1)
Sodium: 136 meq/L (ref 135–145)

## 2024-08-08 LAB — HEPATIC FUNCTION PANEL
ALT: 33 U/L (ref 0–53)
AST: 26 U/L (ref 0–37)
Albumin: 4.5 g/dL (ref 3.5–5.2)
Alkaline Phosphatase: 54 U/L (ref 39–117)
Bilirubin, Direct: 0.1 mg/dL (ref 0.0–0.3)
Total Bilirubin: 0.6 mg/dL (ref 0.2–1.2)
Total Protein: 8.2 g/dL (ref 6.0–8.3)

## 2024-08-08 LAB — VITAMIN D 25 HYDROXY (VIT D DEFICIENCY, FRACTURES): VITD: 65.59 ng/mL (ref 30.00–100.00)

## 2024-08-08 LAB — HEMOGLOBIN A1C: Hgb A1c MFr Bld: 5.9 % (ref 4.6–6.5)

## 2024-08-08 NOTE — Progress Notes (Signed)
 Chief Complaint  Patient presents with   Medicare Wellness     Subjective:  Please attest and cosign this visit due to patients primary care provider not being in the office at the time the visit was completed.  (Pt of Dr Lynwood Rush)   Christopher Mcfarland is a 78 y.o. male who presents for a Medicare Annual Wellness Visit.  Allergies (verified) Other   History: Past Medical History:  Diagnosis Date   Essential hypertension    Gout 01/02/2020   Gout crystals on joint fluid April 2021   Hepatitis C 2003   Treated   Hyperlipidemia    Past Surgical History:  Procedure Laterality Date   CARPAL TUNNEL RELEASE     bilat 1999   HEMORRHOID SURGERY     1999/4 polyps removed rectum   TRIGGER FINGER RELEASE     right hand 1999   Family History  Problem Relation Age of Onset   CAD Brother 27   Kidney cancer Mother    Bone cancer Father    Colon cancer Neg Hx    Pancreatic cancer Neg Hx    Rectal cancer Neg Hx    Stomach cancer Neg Hx    Social History   Occupational History   Occupation: Education Administrator  Tobacco Use   Smoking status: Never   Smokeless tobacco: Never  Vaping Use   Vaping status: Never Used  Substance and Sexual Activity   Alcohol use: Not Currently    Alcohol/week: 2.0 - 3.0 standard drinks of alcohol    Types: 2 - 3 Shots of liquor per week   Drug use: No    Comment: smoked marijuana years ago   Sexual activity: Yes   Tobacco Counseling Counseling given: Not Answered  SDOH Screenings   Food Insecurity: No Food Insecurity (08/08/2024)  Housing: Unknown (08/08/2024)  Transportation Needs: No Transportation Needs (08/08/2024)  Utilities: Not At Risk (08/08/2024)  Alcohol Screen: Low Risk  (08/02/2023)  Depression (PHQ2-9): Low Risk  (08/08/2024)  Financial Resource Strain: Low Risk  (08/02/2023)  Physical Activity: Sufficiently Active (08/08/2024)  Social Connections: Moderately Integrated (08/08/2024)  Stress: No Stress Concern Present  (08/08/2024)  Tobacco Use: Low Risk  (08/08/2024)  Health Literacy: Adequate Health Literacy (08/08/2024)   See flowsheets for full screening details  Depression Screen PHQ 2 & 9 Depression Scale- Over the past 2 weeks, how often have you been bothered by any of the following problems? Little interest or pleasure in doing things: 0 Feeling down, depressed, or hopeless (PHQ Adolescent also includes...irritable): 0 PHQ-2 Total Score: 0 Trouble falling or staying asleep, or sleeping too much: 0 Feeling tired or having little energy: 0 Poor appetite or overeating (PHQ Adolescent also includes...weight loss): 0 Feeling bad about yourself - or that you are a failure or have let yourself or your family down: 0 Trouble concentrating on things, such as reading the newspaper or watching television (PHQ Adolescent also includes...like school work): 0 Moving or speaking so slowly that other people could have noticed. Or the opposite - being so fidgety or restless that you have been moving around a lot more than usual: 0 Thoughts that you would be better off dead, or of hurting yourself in some way: 0 PHQ-9 Total Score: 0     Goals Addressed               This Visit's Progress     Patient Stated (pt-stated)        Patient stated he plans  to continue exercising, manage diet, and monitor cholesterol levels       Visit info / Clinical Intake: Medicare Wellness Visit Type:: Subsequent Annual Wellness Visit Persons participating in visit:: patient Medicare Wellness Visit Mode:: In-person (required for WTM) Information given by:: patient Interpreter Needed?: No Pre-visit prep was completed: yes AWV questionnaire completed by patient prior to visit?: no Living arrangements:: lives with spouse/significant other Patient's Overall Health Status Rating: good Typical amount of pain: none Does pain affect daily life?: no Are you currently prescribed opioids?: no  Dietary Habits and Nutritional  Risks How many meals a day?: 3 Eats fruit and vegetables daily?: yes Most meals are obtained by: preparing own meals In the last 2 weeks, have you had any of the following?: none Diabetic:: no  Functional Status Activities of Daily Living (to include ambulation/medication): Independent Ambulation: Independent with device- listed below Home Assistive Devices/Equipment: Eyeglasses; Dentures (specify type) (wears hearing aids) Medication Administration: Independent Home Management: Independent Manage your own finances?: yes Primary transportation is: driving Concerns about vision?: no *vision screening is required for WTM* Concerns about hearing?: (!) yes Uses hearing aids?: (!) yes Hear whispered voice?: yes  Fall Screening Falls in the past year?: 0 Number of falls in past year: 0 Was there an injury with Fall?: 0 Fall Risk Category Calculator: 0 Patient Fall Risk Level: Low Fall Risk  Fall Risk Patient at Risk for Falls Due to: No Fall Risks Fall risk Follow up: Falls evaluation completed; Falls prevention discussed  Home and Transportation Safety: All rugs have non-skid backing?: N/A, no rugs All stairs or steps have railings?: N/A, no stairs Grab bars in the bathtub or shower?: (!) no Have non-skid surface in bathtub or shower?: yes Good home lighting?: yes Regular seat belt use?: yes Hospital stays in the last year:: (!) yes How many hospital stays:: 4 Reason: Pnuemonia  Cognitive Assessment Difficulty concentrating, remembering, or making decisions? : no Will 6CIT or Mini Cog be Completed: yes What year is it?: 0 points What month is it?: 0 points Give patient an address phrase to remember (5 components): [address] [place], [place] About what time is it?: 0 points Count backwards from 20 to 1: 0 points Say the months of the year in reverse: 0 points Repeat the address phrase from earlier: 0 points 6 CIT Score: 0 points  Advance Directives (For  Healthcare) Does Patient Have a Medical Advance Directive?: No Would patient like information on creating a medical advance directive?: No - Patient declined  Reviewed/Updated  Reviewed/Updated: Reviewed All (Medical, Surgical, Family, Medications, Allergies, Care Teams, Patient Goals)        Objective:    Today's Vitals   08/08/24 0941  BP: 132/70  Pulse: 96  SpO2: 98%  Weight: 206 lb 9.6 oz (93.7 kg)  Height: 5' 8 (1.727 m)   Body mass index is 31.41 kg/m.  Current Medications (verified) Outpatient Encounter Medications as of 08/08/2024  Medication Sig   allopurinol  (ZYLOPRIM ) 300 MG tablet TAKE 1 TABLET(300 MG) BY MOUTH DAILY   amLODipine  (NORVASC ) 5 MG tablet TAKE 1 TABLET(5 MG) BY MOUTH DAILY   aspirin  EC 81 MG tablet Take 1 tablet (81 mg total) by mouth daily. Swallow whole.   Cholecalciferol  (VITAMIN D3) 50 MCG (2000 UT) TABS Take 2,000 Units by mouth daily.   cyclobenzaprine  (FLEXERIL ) 5 MG tablet Take 1 tablet (5 mg total) by mouth 3 (three) times daily as needed for muscle spasms.   ezetimibe  (ZETIA ) 10 MG tablet  Take 1 tablet (10 mg total) by mouth daily.   ipratropium (ATROVENT ) 0.06 % nasal spray Place 2 sprays into both nostrils 3 (three) times daily as needed for rhinitis.   rosuvastatin  (CRESTOR ) 40 MG tablet Take 1 tablet (40 mg total) by mouth daily.   predniSONE  (DELTASONE ) 20 MG tablet Take 2 tablets (40 mg total) by mouth daily with breakfast. (Patient not taking: Reported on 08/08/2024)   No facility-administered encounter medications on file as of 08/08/2024.   Hearing/Vision screen Hearing Screening - Comments:: Wears hearing aids Vision Screening - Comments:: Wears rx glasses - up to date with routine eye exams with [facility] Immunizations and Health Maintenance Health Maintenance  Topic Date Due   Medicare Annual Wellness (AWV)  08/08/2025   DTaP/Tdap/Td (3 - Td or Tdap) 03/07/2030   Pneumococcal Vaccine: 50+ Years  Completed   Influenza  Vaccine  Completed   Hepatitis C Screening  Completed   Zoster Vaccines- Shingrix  Completed   Meningococcal B Vaccine  Aged Out   Colonoscopy  Discontinued   COVID-19 Vaccine  Discontinued        Assessment/Plan:  This is a routine wellness examination for [place].  Patient Care Team: Norleen Lynwood ORN, MD as PCP - General (Internal Medicine) Lavona Lynwood, MD as PCP - Cardiology (Cardiology) Legrand Victory LITTIE MOULD, MD as Consulting Physician (Gastroenterology) Comer, Lamar ORN, MD (Inactive) as Consulting Physician (Infectious Diseases) Carlie Clark, MD as Consulting Physician (Otolaryngology) Rana Lum LITTIE, NP as Nurse Practitioner (Cardiology)  I have personally reviewed and noted the following in the patient's chart:   Medical and social history Use of alcohol, tobacco or illicit drugs  Current medications and supplements including opioid prescriptions. Functional ability and status Nutritional status Physical activity Advanced directives List of other physicians Hospitalizations, surgeries, and ER visits in previous 12 months Vitals Screenings to include cognitive, depression, and falls Referrals and appointments  Orders Placed This Encounter  Procedures   Flu vaccine HIGH DOSE PF(Fluzone Trivalent)   Hepatitis C antibody    Standing Status:   Future    Expiration Date:   08/08/2025   In addition, I have reviewed and discussed with patient certain preventive protocols, quality metrics, and best practice recommendations. A written personalized care plan for preventive services as well as general preventive health recommendations were provided to patient.   Christopher Mcfarland, CMA   08/08/2024   Return in 1 year (on 08/08/2025).  After Visit Summary: (In Person-Declined) Patient declined AVS at this time.  Nurse Notes: Scheduled 2026 AWV appt

## 2024-08-08 NOTE — Patient Instructions (Signed)
 Christopher Mcfarland,  Thank you for taking the time for your Medicare Wellness Visit. I appreciate your continued commitment to your health goals. Please review the care plan we discussed, and feel free to reach out if I can assist you further.  Please note that Annual Wellness Visits do not include a physical exam. Some assessments may be limited, especially if the visit was conducted virtually. If needed, we may recommend an in-person follow-up with your provider.  Ongoing Care Seeing your primary care provider every 3 to 6 months helps us  monitor your health and provide consistent, personalized care.   Referrals If a referral was made during today's visit and you haven't received any updates within two weeks, please contact the referred provider directly to check on the status.  Recommended Screenings:  Health Maintenance  Topic Date Due   Flu Shot  12/11/2024*   Medicare Annual Wellness Visit  08/08/2025   DTaP/Tdap/Td vaccine (3 - Td or Tdap) 03/07/2030   Pneumococcal Vaccine for age over 18  Completed   Hepatitis C Screening  Completed   Zoster (Shingles) Vaccine  Completed   Meningitis B Vaccine  Aged Out   Colon Cancer Screening  Discontinued   COVID-19 Vaccine  Discontinued  *Topic was postponed. The date shown is not the original due date.       01/15/2024    8:43 AM  Advanced Directives  Does Patient Have a Medical Advance Directive? No  Would patient like information on creating a medical advance directive? No - Patient declined    Vision: Annual vision screenings are recommended for early detection of glaucoma, cataracts, and diabetic retinopathy. These exams can also reveal signs of chronic conditions such as diabetes and high blood pressure.  Dental: Annual dental screenings help detect early signs of oral cancer, gum disease, and other conditions linked to overall health, including heart disease and diabetes.

## 2024-08-10 ENCOUNTER — Ambulatory Visit: Payer: Medicare HMO

## 2024-08-12 LAB — HCV RNA,QUANTITATIVE REAL TIME PCR
HCV Quantitative Log: <1.18 {Log_IU}/mL
HCV RNA, PCR, QN: <15 [IU]/mL

## 2024-08-12 LAB — HEPATITIS C ANTIBODY: Hepatitis C Ab: REACTIVE — AB

## 2024-10-06 ENCOUNTER — Other Ambulatory Visit: Payer: Self-pay | Admitting: Internal Medicine

## 2024-10-06 DIAGNOSIS — I10 Essential (primary) hypertension: Secondary | ICD-10-CM

## 2024-10-08 ENCOUNTER — Other Ambulatory Visit: Payer: Self-pay

## 2024-12-04 ENCOUNTER — Ambulatory Visit: Admitting: Family Medicine

## 2025-08-13 ENCOUNTER — Ambulatory Visit

## 2025-08-13 ENCOUNTER — Encounter: Admitting: Family Medicine
